# Patient Record
Sex: Female | Born: 1985 | Race: White | Hispanic: No | Marital: Married | Smoking: Never smoker
Health system: Southern US, Community
[De-identification: ages and names within clinical notes are randomized; demographics above are authoritative.]

## PROBLEM LIST (undated history)

## (undated) DIAGNOSIS — R0600 Dyspnea, unspecified: Secondary | ICD-10-CM

## (undated) DIAGNOSIS — O24419 Gestational diabetes mellitus in pregnancy, unspecified control: Secondary | ICD-10-CM

## (undated) DIAGNOSIS — F419 Anxiety disorder, unspecified: Secondary | ICD-10-CM

## (undated) DIAGNOSIS — K8020 Calculus of gallbladder without cholecystitis without obstruction: Secondary | ICD-10-CM

## (undated) DIAGNOSIS — E039 Hypothyroidism, unspecified: Secondary | ICD-10-CM

## (undated) DIAGNOSIS — E061 Subacute thyroiditis: Secondary | ICD-10-CM

## (undated) DIAGNOSIS — D219 Benign neoplasm of connective and other soft tissue, unspecified: Secondary | ICD-10-CM

## (undated) DIAGNOSIS — N809 Endometriosis, unspecified: Secondary | ICD-10-CM

## (undated) DIAGNOSIS — R002 Palpitations: Secondary | ICD-10-CM

## (undated) DIAGNOSIS — F32A Depression, unspecified: Secondary | ICD-10-CM

## (undated) DIAGNOSIS — E069 Thyroiditis, unspecified: Secondary | ICD-10-CM

## (undated) DIAGNOSIS — D649 Anemia, unspecified: Secondary | ICD-10-CM

## (undated) DIAGNOSIS — N393 Stress incontinence (female) (male): Secondary | ICD-10-CM

## (undated) DIAGNOSIS — N83209 Unspecified ovarian cyst, unspecified side: Secondary | ICD-10-CM

## (undated) DIAGNOSIS — K219 Gastro-esophageal reflux disease without esophagitis: Secondary | ICD-10-CM

## (undated) DIAGNOSIS — J45909 Unspecified asthma, uncomplicated: Secondary | ICD-10-CM

## (undated) HISTORY — DX: Dyspnea, unspecified: R06.00

## (undated) HISTORY — DX: Gestational diabetes mellitus in pregnancy, unspecified control: O24.419

## (undated) HISTORY — DX: Palpitations: R00.2

## (undated) HISTORY — DX: Stress incontinence (female) (male): N39.3

## (undated) HISTORY — DX: Anemia, unspecified: D64.9

## (undated) HISTORY — PX: OTHER SURGICAL HISTORY: SHX169

## (undated) HISTORY — DX: Subacute thyroiditis: E06.1

## (undated) HISTORY — DX: Benign neoplasm of connective and other soft tissue, unspecified: D21.9

## (undated) HISTORY — DX: Endometriosis, unspecified: N80.9

## (undated) HISTORY — DX: Calculus of gallbladder without cholecystitis without obstruction: K80.20

## (undated) HISTORY — DX: Thyroiditis, unspecified: E06.9

## (undated) HISTORY — DX: Unspecified ovarian cyst, unspecified side: N83.209

## (undated) HISTORY — DX: Depression, unspecified: F32.A

---

## 2007-06-25 HISTORY — PX: LAPAROSCOPY: SHX197

## 2014-07-09 ENCOUNTER — Observation Stay: Payer: Self-pay | Admitting: Obstetrics and Gynecology

## 2014-07-09 LAB — URINALYSIS, COMPLETE
BILIRUBIN, UR: NEGATIVE
GLUCOSE, UR: NEGATIVE mg/dL (ref 0–75)
LEUKOCYTE ESTERASE: NEGATIVE
NITRITE: NEGATIVE
Ph: 7 (ref 4.5–8.0)
Protein: NEGATIVE
RBC,UR: 709 /HPF (ref 0–5)
SPECIFIC GRAVITY: 1.012 (ref 1.003–1.030)
Squamous Epithelial: 6

## 2014-07-11 LAB — URINE CULTURE

## 2014-08-11 ENCOUNTER — Inpatient Hospital Stay: Payer: Self-pay

## 2014-11-01 NOTE — H&P (Signed)
L&D Evaluation:  History:  HPI 29yo MWF presents in active labor at 40w, G1P0000, normal prenatal care to date.   Presents with contractions   Patient's Medical History endometriosis   Patient's Surgical History exploratory lap for pelvic pain 2009   Medications Pre Natal Vitamins  Tylenol (Acetaminophen)   Allergies NKDA   Social History none   Family History Non-Contributory   ROS:  ROS All systems were reviewed.  HEENT, CNS, GI, GU, Respiratory, CV, Renal and Musculoskeletal systems were found to be normal.   Exam:  Vital Signs stable   General no apparent distress   Mental Status clear   Chest clear   Heart normal sinus rhythm   Abdomen gravid, tender with contractions   Estimated Fetal Weight Average for gestational age   Fetal Position vtx   Edema 1+   Mebranes Ruptured, 1730   Description clear   FHT normal rate with no decels   FHT Description Variable decelerations   Ucx regular   Ucx Frequency 2 min   Length of each Contraction 60 seconds   Ucx Pain Scale 9   Impression:  Impression active labor   Plan:  Plan monitor contractions and for cervical change, epidural   Electronic Signatures: Ines BloomerBurr, Melody N (CNM)  (Signed 18-Feb-16 22:02)  Authored: L&D Evaluation   Last Updated: 18-Feb-16 22:02 by Ulyses AmorBurr, Melody N (CNM)

## 2015-01-09 ENCOUNTER — Encounter: Payer: Self-pay | Admitting: *Deleted

## 2015-01-10 ENCOUNTER — Ambulatory Visit (INDEPENDENT_AMBULATORY_CARE_PROVIDER_SITE_OTHER): Payer: BLUE CROSS/BLUE SHIELD | Admitting: Obstetrics and Gynecology

## 2015-01-10 ENCOUNTER — Encounter: Payer: Self-pay | Admitting: Obstetrics and Gynecology

## 2015-01-10 ENCOUNTER — Other Ambulatory Visit: Payer: Self-pay | Admitting: Obstetrics and Gynecology

## 2015-01-10 VITALS — BP 131/80 | HR 110 | Ht 65.0 in | Wt 196.8 lb

## 2015-01-10 DIAGNOSIS — R000 Tachycardia, unspecified: Secondary | ICD-10-CM | POA: Diagnosis not present

## 2015-01-10 DIAGNOSIS — Z01419 Encounter for gynecological examination (general) (routine) without abnormal findings: Secondary | ICD-10-CM

## 2015-01-10 NOTE — Patient Instructions (Signed)
Thank you for enrolling in MyChart. Please follow the instructions below to securely access your online medical record. MyChart allows you to send messages to your doctor, view your test results, renew your prescriptions, schedule appointments, and more.  How Do I Sign Up? 1. In your Internet browser, go to [email or web address] 2. Click on the New  User? link in the Sign In box.  3. Enter your MyChart Access Code exactly as it appears below. You will not need to use this code after you have completed the sign-up process. If you do not sign up before the expiration date, you must request a new code. MyChart Access Code: [email or web address] Expires: 03/11/2015 10:51 AM  4. Enter the last four digits of your Social Security Number (xxxx) and Date of Birth (mm/dd/yyyy) as indicated and click Next. You will be taken to the next sign-up page. 5. Create a MyChart ID. This will be your MyChart login ID and cannot be changed, so think of one that is secure and easy to remember. 6. Create a MyChart password. You can change your password at any time. 7. Enter your Password Reset Question and Answer and click Next. This can be used at a later time if you forget your password.  8. Select your communication preference, and if applicable enter your e-mail address. You will receive e-mail notification when new information is available in MyChart by choosing to receive e-mail notifications and filling in your e-mail. 9. Click Sign In. You can now view your medical record.   Additional Information If you have questions, you can email [email or web address] or call [phone] to talk to our MyChart staff. Remember, MyChart is NOT to be used for urgent needs. For medical emergencies, dial 911. Nonspecific Tachycardia Tachycardia is a faster than normal heartbeat (more than 100 beats per minute). In adults, the heart normally beats between 60 and 100 times a minute. A fast heartbeat may be a  normal response to exercise or stress. It does not necessarily mean that something is wrong. However, sometimes when your heart beats too fast it may not be able to pump enough blood to the rest of your body. This can result in chest pain, shortness of breath, dizziness, and even fainting. Nonspecific tachycardia means that the specific cause or pattern of your tachycardia is unknown. CAUSES  Tachycardia may be harmless or it may be due to a more serious underlying cause. Possible causes of tachycardia include:  Exercise or exertion.  Fever.  Pain or injury.  Infection.  Loss of body fluids (dehydration).  Overactive thyroid.  Lack of red blood cells (anemia).  Anxiety and stress.  Alcohol.  Caffeine.  Tobacco products.  Diet pills.  Illegal drugs.  Heart disease. SYMPTOMS  Rapid or irregular heartbeat (palpitations).  Suddenly feeling your heart beating (cardiac awareness).  Dizziness.  Tiredness (fatigue).  Shortness of breath.  Chest pain.  Nausea.  Fainting. DIAGNOSIS  Your caregiver will perform a physical exam and take your medical history. In some cases, a heart specialist (cardiologist) may be consulted. Your caregiver may also order:  Blood tests.  Electrocardiography. This test records the electrical activity of your heart.  A heart monitoring test. TREATMENT  Treatment will depend on the likely cause of your tachycardia. The goal is to treat the underlying cause of your tachycardia. Treatment methods may include:  Replacement of fluids or blood through an intravenous (IV) tube for moderate to severe dehydration or anemia.  New medicines or changes  in your current medicines.  Diet and lifestyle changes.  Treatment for certain infections.  Stress relief or relaxation methods. HOME CARE INSTRUCTIONS   Rest.  Drink enough fluids to keep your urine clear or pale yellow.  Do not  smoke.  Avoid:  Caffeine.  Tobacco.  Alcohol.  Chocolate.  Stimulants such as over-the-counter diet pills or pills that help you stay awake.  Situations that cause anxiety or stress.  Illegal drugs such as marijuana, phencyclidine (PCP), and cocaine.  Only take medicine as directed by your caregiver.  Keep all follow-up appointments as directed by your caregiver. SEEK IMMEDIATE MEDICAL CARE IF:   You have pain in your chest, upper arms, jaw, or neck.  You become weak, dizzy, or feel faint.  You have palpitations that will not go away.  You vomit, have diarrhea, or pass blood in your stool.  Your skin is cool, pale, and wet.  You have a fever that will not go away with rest, fluids, and medicine. MAKE SURE YOU:   Understand these instructions.  Will watch your condition.  Will get help right away if you are not doing well or get worse. Document Released: 07/18/2004 Document Revised: 09/02/2011 Document Reviewed: 05/21/2011 [facility] Patient Information 2015 [place]. This information is not intended to replace advice given to you by your health care provider. Make sure you discuss any questions you have with your health care provider.

## 2015-01-10 NOTE — Progress Notes (Signed)
  Subjective:     Diane [name] is a 29 y.o. female and is here for a comprehensive physical exam. The patient reports waking up with headaches the last few weeks.  History   Social History  . Marital Status: Married    Spouse Name: N/A  . Number of Children: N/A  . Years of Education: N/A   Occupational History  . Not on file.   Social History Main Topics  . Smoking status: Never Smoker   . Smokeless tobacco: Never Used  . Alcohol Use: No  . Drug Use: No  . Sexual Activity: Yes    Birth Control/ Protection: Pill   Other Topics Concern  . Not on file   Social History Narrative   Health Maintenance  Topic Date Due  . HIV Screening  04/22/2000  . PAP SMEAR  04/23/2003  . TETANUS/TDAP  04/22/2004  . INFLUENZA VACCINE  01/23/2015    The following portions of the patient's history were reviewed and updated as appropriate: allergies, current medications, past family history, past medical history, past social history, past surgical history and problem list.  Review of Systems A comprehensive review of systems was negative.   Objective:    General appearance: alert, cooperative and appears stated age Head: Normocephalic, without obvious abnormality, atraumatic Neck: no adenopathy, no carotid bruit, no JVD, supple, symmetrical, trachea midline and thyroid not enlarged, symmetric, no tenderness/mass/nodules Lungs: clear to auscultation bilaterally Breasts: normal appearance, no masses or tenderness Heart: regular rate and rhythm, S1, S2 normal, no murmur, click, rub or gallop and with persistant rate >100 Abdomen: soft, non-tender; bowel sounds normal; no masses,  no organomegaly Pelvic: cervix normal in appearance, external genitalia normal, no adnexal masses or tenderness, no cervical motion tenderness, rectovaginal septum normal, uterus normal size, shape, and consistency and vagina normal without discharge    Assessment:    Healthy female exam. Breastfeeding  without complications; tachycardia-asymptomatic      Plan:  Routine pap obtained, continue health aintenance and breastfeeding friendly lifestyle; labs obtained to screen for reason for tachycardia- patient aware and counseled on s/s of persistant tachycardia or SVT. RTC 1 year of prn    See After Visit Summary for Counseling Recommendations  Melody Ines BloomerBurr, CNM

## 2015-01-11 LAB — COMPREHENSIVE METABOLIC PANEL
ALT: 12 IU/L (ref 0–32)
AST: 9 IU/L (ref 0–40)
Albumin/Globulin Ratio: 1.6 (ref 1.1–2.5)
Albumin: 4.4 g/dL (ref 3.5–5.5)
Alkaline Phosphatase: 82 IU/L (ref 39–117)
BILIRUBIN TOTAL: 0.3 mg/dL (ref 0.0–1.2)
BUN / CREAT RATIO: 15 (ref 8–20)
BUN: 7 mg/dL (ref 6–20)
CALCIUM: 9.5 mg/dL (ref 8.7–10.2)
CHLORIDE: 102 mmol/L (ref 97–108)
CO2: 21 mmol/L (ref 18–29)
Creatinine, Ser: 0.48 mg/dL — ABNORMAL LOW (ref 0.57–1.00)
GFR calc non Af Amer: 133 mL/min/{1.73_m2} (ref >59)
GFR, EST AFRICAN AMERICAN: 153 mL/min/{1.73_m2} (ref >59)
Globulin, Total: 2.8 g/dL (ref 1.5–4.5)
Glucose: 90 mg/dL (ref 65–99)
Potassium: 4.1 mmol/L (ref 3.5–5.2)
SODIUM: 141 mmol/L (ref 134–144)
TOTAL PROTEIN: 7.2 g/dL (ref 6.0–8.5)

## 2015-01-11 LAB — THYROID PANEL WITH TSH
Free Thyroxine Index: 4.5 (ref 1.2–4.9)
T3 Uptake Ratio: 34 % (ref 24–39)
T4, Total: 13.2 ug/dL — ABNORMAL HIGH (ref 4.5–12.0)
TSH: 0.005 u[IU]/mL — ABNORMAL LOW (ref 0.450–4.500)

## 2015-01-11 LAB — VITAMIN D 25 HYDROXY (VIT D DEFICIENCY, FRACTURES): Vit D, 25-Hydroxy: 24.7 ng/mL — ABNORMAL LOW (ref 30.0–100.0)

## 2015-01-11 LAB — CBC
Hematocrit: 36.5 % (ref 34.0–46.6)
Hemoglobin: 12.7 g/dL (ref 11.1–15.9)
MCH: 27.7 pg (ref 26.6–33.0)
MCHC: 34.8 g/dL (ref 31.5–35.7)
MCV: 80 fL (ref 79–97)
Platelets: 337 10*3/uL (ref 150–379)
RBC: 4.58 x10E6/uL (ref 3.77–5.28)
RDW: 12.7 % (ref 12.3–15.4)
WBC: 6.3 10*3/uL (ref 3.4–10.8)

## 2015-01-17 ENCOUNTER — Other Ambulatory Visit: Payer: Self-pay | Admitting: Obstetrics and Gynecology

## 2015-01-17 DIAGNOSIS — R7989 Other specified abnormal findings of blood chemistry: Secondary | ICD-10-CM

## 2015-01-17 DIAGNOSIS — E559 Vitamin D deficiency, unspecified: Secondary | ICD-10-CM

## 2015-01-17 MED ORDER — VITAMIN D (ERGOCALCIFEROL) 1.25 MG (50000 UNIT) PO CAPS: 50000.0000 [IU] | ORAL_CAPSULE | ORAL | Status: DC

## 2015-01-18 ENCOUNTER — Telehealth: Payer: Self-pay | Admitting: *Deleted

## 2015-01-18 LAB — PAP LB, CT-NG NAA, HPV HIGH-RISK
HPV, HIGH-RISK: NEGATIVE
PAP SMEAR COMMENT: 0

## 2015-01-18 NOTE — Telephone Encounter (Signed)
Notified pt of lab results, she would like a referral to [facility] for her thyroid, will pick up rx @ pharmacy

## 2015-01-18 NOTE — Telephone Encounter (Signed)
-----   Message from Ulyses Amor, [place] sent at 01/17/2015  2:45 PM EDT ----- Please let her know her vit d is low,   sent in rx for twice weekly supplement, and want to recheck her levels in 3 months.  Also thyriod function came back abnormal, TSH low and T3 elevated- want her to follow up with an endocrinologist, referral is in system- see if she has a preference. Tell her not to worry yet, as we sometimes see this in the first year after pregnancy.

## 2015-02-13 ENCOUNTER — Ambulatory Visit: Payer: Self-pay | Admitting: Endocrinology

## 2015-02-14 ENCOUNTER — Ambulatory Visit (INDEPENDENT_AMBULATORY_CARE_PROVIDER_SITE_OTHER): Payer: Self-pay | Admitting: Endocrinology

## 2015-02-14 ENCOUNTER — Encounter: Payer: Self-pay | Admitting: Endocrinology

## 2015-02-14 VITALS — BP 126/87 | HR 68 | Temp 98.0°F | Ht 65.0 in

## 2015-02-14 DIAGNOSIS — E0590 Thyrotoxicosis, unspecified without thyrotoxic crisis or storm: Secondary | ICD-10-CM | POA: Diagnosis not present

## 2015-02-14 LAB — TSH: TSH: 70.82 u[IU]/mL — ABNORMAL HIGH (ref 0.35–4.50)

## 2015-02-14 LAB — T4, FREE: FREE T4: 0.33 ng/dL — AB (ref 0.60–1.60)

## 2015-02-14 MED ORDER — LEVOTHYROXINE SODIUM 100 MCG PO TABS: 100.0000 ug | ORAL_TABLET | Freq: Every day | ORAL | Status: DC

## 2015-02-14 NOTE — Patient Instructions (Signed)
blood tests are requested for you today.  We'll let you know about the results. If it is not severely high, you can hold off on any medication for now, as you are breast-feeding. Please come back for a follow-up appointment in 2 months.    Hyperthyroidism The thyroid is a large gland located in the lower front part of your neck. The thyroid helps control metabolism. Metabolism is how your body uses food. It controls metabolism with the hormone thyroxine. When the thyroid is overactive, it produces too much hormone. When this happens, these following problems may occur:   Nervousness  Heat intolerance  Weight loss (in spite of increase food intake)  Diarrhea  Change in hair or skin texture  Palpitations (heart skipping or having extra beats)  Tachycardia (rapid heart rate)  Loss of menstruation (amenorrhea)  Shaking of the hands CAUSES  Grave's Disease (the immune system attacks the thyroid gland). This is the most common cause.  Inflammation of the thyroid gland.  Tumor (usually benign) in the thyroid gland or elsewhere.  Excessive use of thyroid medications (both prescription and 'natural').  Excessive ingestion of Iodine. DIAGNOSIS  To prove hyperthyroidism, your caregiver may do blood tests and ultrasound tests. Sometimes the signs are hidden. It may be necessary for your caregiver to watch this illness with blood tests, either before or after diagnosis and treatment. TREATMENT Short-term treatment There are several treatments to control symptoms. Drugs called beta blockers may give some relief. Drugs that decrease hormone production will provide temporary relief in many people. These measures will usually not give permanent relief. Definitive therapy There are treatments available which can be discussed between you and your caregiver which will permanently treat the problem. These treatments range from surgery (removal of the thyroid), to the use of radioactive iodine  (destroys the thyroid by radiation), to the use of antithyroid drugs (interfere with hormone synthesis). The first two treatments are permanent and usually successful. They most often require hormone replacement therapy for life. This is because it is impossible to remove or destroy the exact amount of thyroid required to make a person euthyroid (normal). HOME CARE INSTRUCTIONS  See your caregiver if the problems you are being treated for get worse. Examples of this would be the problems listed above. SEEK MEDICAL CARE IF: Your general condition worsens. MAKE SURE YOU:   Understand these instructions.  Will watch your condition.  Will get help right away if you are not doing well or get worse. Document Released: 06/10/2005 Document Revised: 09/02/2011 Document Reviewed: 10/22/2006 [facility] Patient Information 2015 [place], [place]. This information is not intended to replace advice given to you by your health care provider. Make sure you discuss any questions you have with your health care provider.

## 2015-02-14 NOTE — Progress Notes (Signed)
Subjective:    Patient ID: Diane [name], female    DOB: [birth year], 29 y.o.   MRN: [id number]  HPI Pt reports he was dx'ed with hyperthyroidism in mid-2016.  she has no previous h/o thyroid problems.  she has never had XRT to the anterior neck, or thyroid surgery.  she has never had thyroid imaging.  she does not consume kelp or any other prescribed or non-prescribed thyroid medication.  she has never been on amiodarone.  She is 6 mos postpartum.  She has slight hair loss throughout the head, and assoc fatigue.  She is breast feeding.   Past Medical History  Diagnosis Date  . Endometriosis   . Ovarian cyst     Past Surgical History  Procedure Laterality Date  . Laparoscopy  2009    Social History   Social History  . Marital Status: Married    Spouse Name: N/A  . Number of Children: N/A  . Years of Education: N/A   Occupational History  . Not on file.   Social History Main Topics  . Smoking status: Never Smoker   . Smokeless tobacco: Never Used  . Alcohol Use: No  . Drug Use: No  . Sexual Activity: Yes    Birth Control/ Protection: Pill   Other Topics Concern  . Not on file   Social History Narrative    Current Outpatient Prescriptions on File Prior to Visit  Medication Sig Dispense Refill  . albuterol (PROVENTIL HFA;VENTOLIN HFA) 108 (90 BASE) MCG/ACT inhaler Inhale into the lungs every 6 (six) hours as needed for wheezing or shortness of breath.    . norethindrone (MICRONOR,CAMILA,ERRIN) 0.35 MG tablet Take 1 tablet by mouth daily.    . Prenatal Vit-Fe Fumarate-FA (PRENATAL MULTIVITAMIN) TABS tablet Take 1 tablet by mouth daily at 12 noon.    . Vitamin D, Ergocalciferol, (DRISDOL) 50000 UNITS CAPS capsule Take 1 capsule (50,000 Units total) by mouth 2 (two) times a week. 30 capsule 2   No current facility-administered medications on file prior to visit.    No Known Allergies  Family History  Problem Relation Age of Onset  . Diabetes Father   . Cancer  Maternal Grandmother     colon, breast  . Heart disease Maternal Grandmother   . Heart disease Paternal Grandfather   . Thyroid disease Neg Hx     BP 126/87 mmHg  Pulse 68  Temp(Src) 98 F (36.7 C) (Oral)  Ht  (1.651 m)  SpO2 94%  Review of Systems denies headache, hoarseness, double vision, sob, diarrhea, polyuria, myalgias, excessive diaphoresis, tremor, anxiety, hypoglycemia, easy bruising, and rhinorrhea.  She has lost back to her pre-pregnancy weight.  She has intermittent palpitations.     Objective:   Physical Exam VS: see vs page GEN: no distress HEAD: head: no deformity eyes: no periorbital swelling, no proptosis external nose and ears are normal mouth: no lesion seen NECK: thyroid is approx 3-4 times normal size, with a slightly irregular surface CHEST WALL: no deformity LUNGS:  Clear to auscultation CV: reg rate and rhythm, no murmur ABD: abdomen is soft, nontender.  no hepatosplenomegaly.  not distended.  no hernia MUSCULOSKELETAL: muscle bulk and strength are grossly normal.  no obvious joint swelling.  gait is normal and steady EXTEMITIES: no deformity.  no ulcer on the feet.  feet are of normal color and temp.  no edema PULSES: dorsalis pedis intact bilat.  no carotid bruit NEURO:  cn 2-12 grossly intact.  readily moves all 4's.  sensation is intact to touch on the feet.  No tremor. SKIN:  Normal texture and temperature.  No rash or suspicious lesion is visible.   NODES:  None palpable at the neck PSYCH: alert, well-oriented.  Does not appear anxious nor depressed.  outside test results are reviewed: (july, 2016): TSH=0.005  Today: Lab Results  Component Value Date   TSH 70.82* 02/14/2015   T4TOTAL 13.2* 01/10/2015      Assessment & Plan:  Hypothyroidism, new.  As this has evolved from hyperthyroidism, dx is subacute (viral) thyroiditis.  I have sent a prescription to your pharmacy, for synthroid.    Patient is advised the following: Patient  Instructions  blood tests are requested for you today.  We'll let you know about the results. If it is not severely high, you can hold off on any medication for now, as you are breast-feeding. Please come back for a follow-up appointment in 2 months.    Hyperthyroidism The thyroid is a large gland located in the lower front part of your neck. The thyroid helps control metabolism. Metabolism is how your body uses food. It controls metabolism with the hormone thyroxine. When the thyroid is overactive, it produces too much hormone. When this happens, these following problems may occur:   Nervousness  Heat intolerance  Weight loss (in spite of increase food intake)  Diarrhea  Change in hair or skin texture  Palpitations (heart skipping or having extra beats)  Tachycardia (rapid heart rate)  Loss of menstruation (amenorrhea)  Shaking of the hands CAUSES  Grave's Disease (the immune system attacks the thyroid gland). This is the most common cause.  Inflammation of the thyroid gland.  Tumor (usually benign) in the thyroid gland or elsewhere.  Excessive use of thyroid medications (both prescription and 'natural').  Excessive ingestion of Iodine. DIAGNOSIS  To prove hyperthyroidism, your caregiver may do blood tests and ultrasound tests. Sometimes the signs are hidden. It may be necessary for your caregiver to watch this illness with blood tests, either before or after diagnosis and treatment. TREATMENT Short-term treatment There are several treatments to control symptoms. Drugs called beta blockers may give some relief. Drugs that decrease hormone production will provide temporary relief in many people. These measures will usually not give permanent relief. Definitive therapy There are treatments available which can be discussed between you and your caregiver which will permanently treat the problem. These treatments range from surgery (removal of the thyroid), to the use of  radioactive iodine (destroys the thyroid by radiation), to the use of antithyroid drugs (interfere with hormone synthesis). The first two treatments are permanent and usually successful. They most often require hormone replacement therapy for life. This is because it is impossible to remove or destroy the exact amount of thyroid required to make a person euthyroid (normal). HOME CARE INSTRUCTIONS  See your caregiver if the problems you are being treated for get worse. Examples of this would be the problems listed above. SEEK MEDICAL CARE IF: Your general condition worsens. MAKE SURE YOU:   Understand these instructions.  Will watch your condition.  Will get help right away if you are not doing well or get worse. Document Released: 06/10/2005 Document Revised: 09/02/2011 Document Reviewed: 10/22/2006 [facility] Patient Information 2015 [place], [place]. This information is not intended to replace advice given to you by your health care provider. Make sure you discuss any questions you have with your health care provider.

## 2015-02-28 ENCOUNTER — Telehealth: Payer: Self-pay | Admitting: Obstetrics and Gynecology

## 2015-02-28 NOTE — Telephone Encounter (Signed)
Can be normal, only other options since she is breastfeeding is to switch to Depo or LoLo Estrin , but combined pill may decrease milk supply.

## 2015-02-28 NOTE — Telephone Encounter (Signed)
Notified pt of MNB response she voiced understanding

## 2015-02-28 NOTE — Telephone Encounter (Signed)
Pt is having a period q 2 weeks, she is breast feeding and taking oral contraceptives, she takes it at same time each night, pls advise

## 2015-02-28 NOTE — Telephone Encounter (Signed)
PT CALLED AND SHE HAD SOME QUESTIONS ABOUT THE BC SHE IS ON

## 2015-03-20 ENCOUNTER — Encounter: Payer: Self-pay | Admitting: Endocrinology

## 2015-03-20 ENCOUNTER — Ambulatory Visit (INDEPENDENT_AMBULATORY_CARE_PROVIDER_SITE_OTHER): Payer: BLUE CROSS/BLUE SHIELD | Admitting: Endocrinology

## 2015-03-20 VITALS — BP 122/80 | HR 80 | Temp 98.5°F | Ht 65.0 in | Wt 198.0 lb

## 2015-03-20 DIAGNOSIS — E0590 Thyrotoxicosis, unspecified without thyrotoxic crisis or storm: Secondary | ICD-10-CM

## 2015-03-20 DIAGNOSIS — E061 Subacute thyroiditis: Secondary | ICD-10-CM

## 2015-03-20 DIAGNOSIS — E039 Hypothyroidism, unspecified: Secondary | ICD-10-CM | POA: Insufficient documentation

## 2015-03-20 HISTORY — DX: Subacute thyroiditis: E06.1

## 2015-03-20 LAB — T4, FREE: Free T4: 0.85 ng/dL (ref 0.60–1.60)

## 2015-03-20 LAB — TSH: TSH: 4.74 u[IU]/mL — ABNORMAL HIGH (ref 0.35–4.50)

## 2015-03-20 MED ORDER — LEVOTHYROXINE SODIUM 112 MCG PO TABS
112.0000 ug | ORAL_TABLET | Freq: Every day | ORAL | Status: DC
Start: 2015-03-20 — End: 2015-08-02

## 2015-03-20 NOTE — Patient Instructions (Addendum)
blood tests are requested for you today.  We'll let you know about the results. Please come back for a follow-up appointment in 2 months.  This condition is called "subacute thyroiditis."

## 2015-03-20 NOTE — Progress Notes (Signed)
   Subjective:    Patient ID: Diane [name], female    DOB: Oct 24, 1985, 29 y.o.   MRN: [id number]  HPI Pt returns for f/u of subacute thyroiditis (dx'ed mid-2016; she has never had thyroid imaging.  she had a child in ealry 2016).  she is breast feeding.  Since on synthroid, pt states she feels no different, and well in general.   Past Medical History  Diagnosis Date  . Endometriosis   . Ovarian cyst     Past Surgical History  Procedure Laterality Date  . Laparoscopy  2009    Social History   Social History  . Marital Status: Married    Spouse Name: N/A  . Number of Children: N/A  . Years of Education: N/A   Occupational History  . Not on file.   Social History Main Topics  . Smoking status: Never Smoker   . Smokeless tobacco: Never Used  . Alcohol Use: No  . Drug Use: No  . Sexual Activity: Yes    Birth Control/ Protection: Pill   Other Topics Concern  . Not on file   Social History Narrative    Current Outpatient Prescriptions on File Prior to Visit  Medication Sig Dispense Refill  . albuterol (PROVENTIL HFA;VENTOLIN HFA) 108 (90 BASE) MCG/ACT inhaler Inhale into the lungs every 6 (six) hours as needed for wheezing or shortness of breath.    . norethindrone (MICRONOR,CAMILA,ERRIN) 0.35 MG tablet Take 1 tablet by mouth daily.    . Prenatal Vit-Fe Fumarate-FA (PRENATAL MULTIVITAMIN) TABS tablet Take 1 tablet by mouth daily at 12 noon.    . Vitamin D, Ergocalciferol, (DRISDOL) 50000 UNITS CAPS capsule Take 1 capsule (50,000 Units total) by mouth 2 (two) times a week. 30 capsule 2   No current facility-administered medications on file prior to visit.    No Known Allergies  Family History  Problem Relation Age of Onset  . Diabetes Father   . Cancer Maternal Grandmother     colon, breast  . Heart disease Maternal Grandmother   . Heart disease Paternal Grandfather   . Thyroid disease Neg Hx     BP 122/80 mmHg  Pulse 80  Temp(Src) 98.5 F (36.9 C)  (Oral)  Ht  (1.651 m)  Wt 198 lb (89.812 kg)  BMI 32.95 kg/m2  SpO2 96%  Review of Systems Hair loss is improved.      Objective:   Physical Exam VITAL SIGNS:  See vs page GENERAL: no distress Neck; thyroid is twice normal size, with slightly irreg surface.     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  She needs increased rx.    Patient is advised the following: Patient Instructions  blood tests are requested for you today.  We'll let you know about the results. Please come back for a follow-up appointment in 2 months.  This condition is called "subacute thyroiditis."

## 2015-03-22 ENCOUNTER — Ambulatory Visit: Payer: BLUE CROSS/BLUE SHIELD | Admitting: Endocrinology

## 2015-04-17 ENCOUNTER — Ambulatory Visit: Payer: BLUE CROSS/BLUE SHIELD | Admitting: Endocrinology

## 2015-05-15 ENCOUNTER — Ambulatory Visit: Payer: BLUE CROSS/BLUE SHIELD | Admitting: Endocrinology

## 2015-05-24 ENCOUNTER — Ambulatory Visit (INDEPENDENT_AMBULATORY_CARE_PROVIDER_SITE_OTHER): Payer: BLUE CROSS/BLUE SHIELD | Admitting: Endocrinology

## 2015-05-24 ENCOUNTER — Encounter: Payer: Self-pay | Admitting: Endocrinology

## 2015-05-24 VITALS — BP 124/82 | HR 87 | Temp 98.2°F | Ht 65.0 in | Wt 200.0 lb

## 2015-05-24 DIAGNOSIS — E061 Subacute thyroiditis: Secondary | ICD-10-CM

## 2015-05-24 LAB — T4, FREE: FREE T4: 0.93 ng/dL (ref 0.60–1.60)

## 2015-05-24 LAB — TSH: TSH: 2.31 u[IU]/mL (ref 0.35–4.50)

## 2015-05-24 NOTE — Patient Instructions (Addendum)
blood tests are requested for you today.  We'll let you know about the results. Please come back for a follow-up appointment in 4 months.     

## 2015-05-24 NOTE — Progress Notes (Signed)
   Subjective:    Patient ID: Diane [name], female    DOB: [birth year], 29 y.o.   MRN: [id number]  HPI Pt returns for f/u of subacute thyroiditis (dx'ed mid-2016; she has never had thyroid imaging.  she had a child in ealry 2016; she was rx'ed synthroid in late 2016).  she is breast feeding. pt states she feels well in general.   Past Medical History  Diagnosis Date  . Endometriosis   . Ovarian cyst     Past Surgical History  Procedure Laterality Date  . Laparoscopy  2009    Social History   Social History  . Marital Status: Married    Spouse Name: N/A  . Number of Children: N/A  . Years of Education: N/A   Occupational History  . Not on file.   Social History Main Topics  . Smoking status: Never Smoker   . Smokeless tobacco: Never Used  . Alcohol Use: No  . Drug Use: No  . Sexual Activity: Yes    Birth Control/ Protection: Pill   Other Topics Concern  . Not on file   Social History Narrative    Current Outpatient Prescriptions on File Prior to Visit  Medication Sig Dispense Refill  . albuterol (PROVENTIL HFA;VENTOLIN HFA) 108 (90 BASE) MCG/ACT inhaler Inhale into the lungs every 6 (six) hours as needed for wheezing or shortness of breath.    . levothyroxine (SYNTHROID, LEVOTHROID) 112 MCG tablet Take 1 tablet (112 mcg total) by mouth daily before breakfast. 30 tablet 2  . norethindrone (MICRONOR,CAMILA,ERRIN) 0.35 MG tablet Take 1 tablet by mouth daily.    . Prenatal Vit-Fe Fumarate-FA (PRENATAL MULTIVITAMIN) TABS tablet Take 1 tablet by mouth daily at 12 noon.    . Vitamin D, Ergocalciferol, (DRISDOL) 50000 UNITS CAPS capsule Take 1 capsule (50,000 Units total) by mouth 2 (two) times a week. 30 capsule 2   No current facility-administered medications on file prior to visit.    No Known Allergies  Family History  Problem Relation Age of Onset  . Diabetes Father   . Cancer Maternal Grandmother     colon, breast  . Heart disease Maternal Grandmother   .  Heart disease Paternal Grandfather   . Thyroid disease Neg Hx     BP 124/82 mmHg  Pulse 87  Temp(Src) 98.2 F (36.8 C) (Oral)  Ht 5\' 5"  (1.651 m)  Wt 200 lb (90.719 kg)  BMI 33.28 kg/m2  SpO2 97%  Review of Systems No weight change    Objective:   Physical Exam VITAL SIGNS:  See vs page GENERAL: no distress Neck: thyroid is slightly enlarged, with slightly irreg surface.   Lab Results  Component Value Date   TSH 2.31 05/24/2015   T4TOTAL 13.2* 01/10/2015      Assessment & Plan:  Hypothyroidism: well-replaced.    Patient is advised the following: Patient Instructions  blood tests are requested for you today.  We'll let you know about the results.  Please come back for a follow-up appointment in 4 months.      addendum: Please continue the same synthroid.

## 2015-08-02 ENCOUNTER — Other Ambulatory Visit: Payer: Self-pay | Admitting: Endocrinology

## 2015-08-29 ENCOUNTER — Other Ambulatory Visit: Payer: Self-pay | Admitting: Endocrinology

## 2015-09-19 ENCOUNTER — Encounter: Payer: Self-pay | Admitting: Endocrinology

## 2015-09-19 ENCOUNTER — Ambulatory Visit (INDEPENDENT_AMBULATORY_CARE_PROVIDER_SITE_OTHER): Payer: BLUE CROSS/BLUE SHIELD | Admitting: Endocrinology

## 2015-09-19 VITALS — BP 122/80 | HR 87 | Temp 98.1°F | Ht 69.0 in | Wt 200.0 lb

## 2015-09-19 DIAGNOSIS — E061 Subacute thyroiditis: Secondary | ICD-10-CM | POA: Diagnosis not present

## 2015-09-19 LAB — TSH: TSH: 0.46 u[IU]/mL (ref 0.35–4.50)

## 2015-09-19 NOTE — Patient Instructions (Addendum)
blood tests are requested for you today.  We'll let you know about the results.  Please come back for a follow-up appointment in 6 months, or sooner if another pregnancy happens.

## 2015-09-19 NOTE — Progress Notes (Signed)
   Subjective:    Patient ID: Diane [name], female    DOB: 11-Apr-1986, 30 y.o.   MRN: [id number]  HPI Pt returns for f/u of subacute thyroiditis (dx'ed mid-2016; she has never had thyroid imaging; she had a child in ealry 2016; she was rx'ed synthroid in late 2016).  she is still breast feeding. pt states she feels well in general.  She is not considering another pregnancy now.   Past Medical History  Diagnosis Date  . Endometriosis   . Ovarian cyst     Past Surgical History  Procedure Laterality Date  . Laparoscopy  2009    Social History   Social History  . Marital Status: Married    Spouse Name: N/A  . Number of Children: N/A  . Years of Education: N/A   Occupational History  . Not on file.   Social History Main Topics  . Smoking status: Never Smoker   . Smokeless tobacco: Never Used  . Alcohol Use: No  . Drug Use: No  . Sexual Activity: Yes    Birth Control/ Protection: Pill   Other Topics Concern  . Not on file   Social History Narrative    Current Outpatient Prescriptions on File Prior to Visit  Medication Sig Dispense Refill  . albuterol (PROVENTIL HFA;VENTOLIN HFA) 108 (90 BASE) MCG/ACT inhaler Inhale into the lungs every 6 (six) hours as needed for wheezing or shortness of breath.    . levothyroxine (SYNTHROID, LEVOTHROID) 112 MCG tablet TAKE 1 TABLET(112 MCG) BY MOUTH DAILY BEFORE BREAKFAST 30 tablet 0  . norethindrone (MICRONOR,CAMILA,ERRIN) 0.35 MG tablet Take 1 tablet by mouth daily.    . Prenatal Vit-Fe Fumarate-FA (PRENATAL MULTIVITAMIN) TABS tablet Take 1 tablet by mouth daily at 12 noon.    . fluconazole (DIFLUCAN) 100 MG tablet Reported on 09/19/2015  0  . Vitamin D, Ergocalciferol, (DRISDOL) 50000 UNITS CAPS capsule Take 1 capsule (50,000 Units total) by mouth 2 (two) times a week. (Patient not taking: Reported on 09/19/2015) 30 capsule 2   No current facility-administered medications on file prior to visit.    No Known Allergies  Family  History  Problem Relation Age of Onset  . Diabetes Father   . Cancer Maternal Grandmother     colon, breast  . Heart disease Maternal Grandmother   . Heart disease Paternal Grandfather   . Thyroid disease Neg Hx     BP 122/80 mmHg  Pulse 87  Temp(Src) 98.1 F (36.7 C) (Oral)  Ht 5\' 9"  (1.753 m)  Wt 200 lb (90.719 kg)  BMI 29.52 kg/m2  SpO2 96%  Review of Systems No weight change    Objective:   Physical Exam VITAL SIGNS:  See vs page GENERAL: no distress Neck: thyroid is slightly enlarged, with slightly irreg surface.    Lab Results  Component Value Date   TSH 0.46 09/19/2015   T4TOTAL 13.2* 01/10/2015      Assessment & Plan:  Hypothyroidism: well-replaced  Patient is advised the following: Patient Instructions  blood tests are requested for you today.  We'll let you know about the results.  Please come back for a follow-up appointment in 6 months, or sooner if another pregnancy happens.

## 2015-09-25 ENCOUNTER — Telehealth: Payer: Self-pay | Admitting: Obstetrics and Gynecology

## 2015-09-25 NOTE — Telephone Encounter (Signed)
Pt is weining her baby from breast feeding. She doesn't remember how many times a day that she needs to be down to before changing her BC.

## 2015-09-26 NOTE — Telephone Encounter (Signed)
Less than 2 times in 24 hours. I will go ahead and send in a different BC pill if that is what she is planning to use???

## 2015-09-26 NOTE — Telephone Encounter (Signed)
pls advise

## 2015-09-27 ENCOUNTER — Other Ambulatory Visit: Payer: Self-pay | Admitting: Obstetrics and Gynecology

## 2015-09-27 MED ORDER — NORETHIN ACE-ETH ESTRAD-FE 1-20 MG-MCG PO TABS: 1.0000 | ORAL_TABLET | Freq: Every day | ORAL | Status: DC

## 2015-09-27 NOTE — Telephone Encounter (Signed)
Yes she is still planning on taking OCP, go ahead and send in she only has 1 pill left

## 2015-10-05 ENCOUNTER — Other Ambulatory Visit: Payer: Self-pay | Admitting: Endocrinology

## 2016-01-07 ENCOUNTER — Other Ambulatory Visit: Payer: Self-pay | Admitting: Endocrinology

## 2016-01-11 ENCOUNTER — Ambulatory Visit (INDEPENDENT_AMBULATORY_CARE_PROVIDER_SITE_OTHER): Payer: BLUE CROSS/BLUE SHIELD | Admitting: Obstetrics and Gynecology

## 2016-01-11 ENCOUNTER — Encounter: Payer: Self-pay | Admitting: Obstetrics and Gynecology

## 2016-01-11 VITALS — BP 118/78 | HR 81 | Ht 65.0 in | Wt 207.0 lb

## 2016-01-11 DIAGNOSIS — N921 Excessive and frequent menstruation with irregular cycle: Secondary | ICD-10-CM | POA: Diagnosis not present

## 2016-01-11 DIAGNOSIS — Z01419 Encounter for gynecological examination (general) (routine) without abnormal findings: Secondary | ICD-10-CM | POA: Diagnosis not present

## 2016-01-11 DIAGNOSIS — E663 Overweight: Secondary | ICD-10-CM | POA: Diagnosis not present

## 2016-01-11 MED ORDER — NORGESTIMATE-ETH ESTRADIOL 0.25-35 MG-MCG PO TABS: 1.0000 | ORAL_TABLET | Freq: Every day | ORAL | Status: DC

## 2016-01-11 NOTE — Progress Notes (Signed)
Subjective:   Diane [name] is a 30 y.o. G1P0 Caucasian female here for a routine well-woman exam.  Patient's last menstrual period was 12/04/2015.    Current complaints: BTB in middle of pills PCP: none       does desire labs  Social History: Sexual: heterosexual Marital Status: married Living situation: with family Occupation: homemaker Tobacco/alcohol: no tobacco use Illicit drugs: no history of illicit drug use  The following portions of the patient's history were reviewed and updated as appropriate: allergies, current medications, past family history, past medical history, past social history, past surgical history and problem list.  Past Medical History Past Medical History  Diagnosis Date  . Endometriosis   . Ovarian cyst     Past Surgical History Past Surgical History  Procedure Laterality Date  . Laparoscopy  2009    Gynecologic History G1P0  Patient's last menstrual period was 12/04/2015. Contraception: OCP (estrogen/progesterone) Last Pap: 2016. Results were: normal   Obstetric History OB History  Gravida Para Term Preterm AB SAB TAB Ectopic Multiple Living  1         1    # Outcome Date GA Lbr Len/2nd Weight Sex Delivery Anes PTL Lv  1 Gravida 08/11/14    F Vag-Spont   Y      Current Medications Current Outpatient Prescriptions on File Prior to Visit  Medication Sig Dispense Refill  . levothyroxine (SYNTHROID, LEVOTHROID) 112 MCG tablet TAKE 1 TABLET(112 MCG) BY MOUTH DAILY BEFORE BREAKFAST 30 tablet 1  . norethindrone-ethinyl estradiol (JUNEL FE 1/20) 1-20 MG-MCG tablet Take 1 tablet by mouth daily. 1 Package 11  . Prenatal Vit-Fe Fumarate-FA (PRENATAL MULTIVITAMIN) TABS tablet Take 1 tablet by mouth daily at 12 noon.    Marland Kitchen. albuterol (PROVENTIL HFA;VENTOLIN HFA) 108 (90 BASE) MCG/ACT inhaler Inhale into the lungs every 6 (six) hours as needed for wheezing or shortness of breath.     No current facility-administered medications on file prior to  visit.    Review of Systems Patient denies any headaches, blurred vision, shortness of breath, chest pain, abdominal pain, problems with bowel movements, urination, or intercourse.  Objective:  BP 118/78 mmHg  Pulse 81  Ht 5\' 5"  (1.651 m)  Wt 207 lb (93.895 kg)  BMI 34.45 kg/m2  LMP 12/04/2015  Breastfeeding? No Physical Exam  General:  Well developed, well nourished, no acute distress. She is alert and oriented x3. Skin:  Warm and dry Neck:  Midline trachea, no thyromegaly or nodules Cardiovascular: Regular rate and rhythm, no murmur heard Lungs:  Effort normal, all lung fields clear to auscultation bilaterally Breasts:  No dominant palpable mass, retraction, or nipple discharge Abdomen:  Soft, non tender, no hepatosplenomegaly or masses Pelvic:  External genitalia is normal in appearance.  The vagina is normal in appearance. The cervix is bulbous, no CMT.  Thin prep pap is not done  . Uterus is felt to be normal size, shape, and contour.  No adnexal masses or tenderness noted. Extremities:  No swelling or varicosities noted Psych:  She has a normal mood and affect  Assessment:   Healthy well-woman exam Hypothyroidism BTB on current OCP overweight  Plan:  Labs obtained Will change OCPs F/U 1 year for AE, or sooner if needed   Melody Suzan NailerN Shambley, CNM

## 2016-01-11 NOTE — Patient Instructions (Signed)
  Place annual gynecologic exam patient instructions here.  Thank you for enrolling in MyChart. Please follow the instructions below to securely access your online medical record. MyChart allows you to send messages to your doctor, view your test results, manage appointments, and more.   How Do I Sign Up? 1. In your Internet browser, go to [employer] and enter [email or web address]. 2. Click on the Sign Up Now link in the Sign In box. You will see the New Member Sign Up page. 3. Enter your MyChart Access Code exactly as it appears below. You will not need to use this code after you've completed the sign-up process. If you do not sign up before the expiration date, you must request a new code.  MyChart Access Code: [email or web address] Expires: 03/10/2016 10:11 AM  4. Enter your Social Security Number ([id number]) and Date of Birth (mm/dd/yyyy) as indicated and click Submit. You will be taken to the next sign-up page. 5. Create a MyChart ID. This will be your MyChart login ID and cannot be changed, so think of one that is secure and easy to remember. 6. Create a MyChart password. You can change your password at any time. 7. Enter your Password Reset Question and Answer. This can be used at a later time if you forget your password.  8. Enter your e-mail address. You will receive e-mail notification when new information is available in MyChart. 9. Click Sign Up. You can now view your medical record.   Additional Information Remember, MyChart is NOT to be used for urgent needs. For medical emergencies, dial 911.

## 2016-01-12 LAB — COMPREHENSIVE METABOLIC PANEL
A/G RATIO: 1.8 (ref 1.2–2.2)
ALBUMIN: 4.5 g/dL (ref 3.5–5.5)
ALT: 8 IU/L (ref 0–32)
AST: 9 IU/L (ref 0–40)
Alkaline Phosphatase: 62 IU/L (ref 39–117)
BUN / CREAT RATIO: 15 (ref 9–23)
BUN: 10 mg/dL (ref 6–20)
Bilirubin Total: 0.2 mg/dL (ref 0.0–1.2)
CO2: 21 mmol/L (ref 18–29)
CREATININE: 0.65 mg/dL (ref 0.57–1.00)
Calcium: 8.5 mg/dL — ABNORMAL LOW (ref 8.7–10.2)
Chloride: 98 mmol/L (ref 96–106)
GFR calc Af Amer: 138 mL/min/{1.73_m2} (ref >59)
GFR, EST NON AFRICAN AMERICAN: 120 mL/min/{1.73_m2} (ref >59)
GLOBULIN, TOTAL: 2.5 g/dL (ref 1.5–4.5)
GLUCOSE: 87 mg/dL (ref 65–99)
POTASSIUM: 4.1 mmol/L (ref 3.5–5.2)
SODIUM: 137 mmol/L (ref 134–144)
Total Protein: 7 g/dL (ref 6.0–8.5)

## 2016-01-12 LAB — THYROID PANEL WITH TSH
FREE THYROXINE INDEX: 2.8 (ref 1.2–4.9)
T3 UPTAKE RATIO: 21 % — AB (ref 24–39)
T4 TOTAL: 13.2 ug/dL — AB (ref 4.5–12.0)
TSH: 1.36 u[IU]/mL (ref 0.450–4.500)

## 2016-01-12 LAB — LIPID PANEL
CHOL/HDL RATIO: 4.6 ratio — AB (ref 0.0–4.4)
Cholesterol, Total: 211 mg/dL — ABNORMAL HIGH (ref 100–199)
HDL: 46 mg/dL (ref >39)
LDL Calculated: 124 mg/dL — ABNORMAL HIGH (ref 0–99)
Triglycerides: 204 mg/dL — ABNORMAL HIGH (ref 0–149)
VLDL Cholesterol Cal: 41 mg/dL — ABNORMAL HIGH (ref 5–40)

## 2016-03-21 ENCOUNTER — Ambulatory Visit: Payer: BLUE CROSS/BLUE SHIELD | Admitting: Endocrinology

## 2016-03-21 ENCOUNTER — Ambulatory Visit (INDEPENDENT_AMBULATORY_CARE_PROVIDER_SITE_OTHER): Payer: BLUE CROSS/BLUE SHIELD | Admitting: Endocrinology

## 2016-03-21 VITALS — BP 118/72 | HR 78 | Ht 65.0 in | Wt 207.0 lb

## 2016-03-21 DIAGNOSIS — E061 Subacute thyroiditis: Secondary | ICD-10-CM | POA: Diagnosis not present

## 2016-03-21 LAB — TSH: TSH: 1.47 u[IU]/mL (ref 0.35–4.50)

## 2016-03-21 LAB — T4, FREE: Free T4: 0.81 ng/dL (ref 0.60–1.60)

## 2016-03-21 NOTE — Patient Instructions (Signed)
blood tests are requested for you today.  We'll let you know about the results.  Please come back for a follow-up appointment in 6 months, or sooner if another pregnancy happens.

## 2016-03-21 NOTE — Progress Notes (Signed)
   Subjective:    Patient ID: Diane [name], female    DOB: 1985-11-17, 30 y.o.   MRN: [id number]  HPI Pt returns for f/u of subacute thyroiditis (dx'ed mid-2016, when she presented with hyprethyroidism; she has never had thyroid imaging; she had a child in ealry 2016; she was rx'ed synthroid in late 2016).  pt states she feels well in general, except for fatigue.  She is not considering another pregnancy now.   Past Medical History:  Diagnosis Date  . Endometriosis   . Ovarian cyst     Past Surgical History:  Procedure Laterality Date  . LAPAROSCOPY  2009    Social History   Social History  . Marital status: Married    Spouse name: N/A  . Number of children: N/A  . Years of education: N/A   Occupational History  . Not on file.   Social History Main Topics  . Smoking status: Never Smoker  . Smokeless tobacco: Never Used  . Alcohol use No  . Drug use: No  . Sexual activity: Yes    Birth control/ protection: Pill   Other Topics Concern  . Not on file   Social History Narrative  . No narrative on file    Current Outpatient Prescriptions on File Prior to Visit  Medication Sig Dispense Refill  . albuterol (PROVENTIL HFA;VENTOLIN HFA) 108 (90 BASE) MCG/ACT inhaler Inhale into the lungs every 6 (six) hours as needed for wheezing or shortness of breath.    . levothyroxine (SYNTHROID, LEVOTHROID) 112 MCG tablet TAKE 1 TABLET(112 MCG) BY MOUTH DAILY BEFORE BREAKFAST 30 tablet 1  . norgestimate-ethinyl estradiol (ORTHO-CYCLEN,SPRINTEC,PREVIFEM) 0.25-35 MG-MCG tablet Take 1 tablet by mouth daily. 1 Package 11  . Prenatal Vit-Fe Fumarate-FA (PRENATAL MULTIVITAMIN) TABS tablet Take 1 tablet by mouth daily at 12 noon.     No current facility-administered medications on file prior to visit.     No Known Allergies  Family History  Problem Relation Age of Onset  . Diabetes Father   . Cancer Maternal Grandmother     colon, breast  . Heart disease Maternal Grandmother   .  Heart disease Paternal Grandfather   . Thyroid disease Neg Hx     Review of Systems No weight change    Objective:   Physical Exam VITAL SIGNS:  See vs page.  GENERAL: no distress.  NECK: There is no palpable thyroid enlargement.  No thyroid nodule is palpable.  No palpable lymphadenopathy at the anterior neck.   Lab Results  Component Value Date   TSH 1.360 01/11/2016   T4TOTAL 13.2 (H) 01/11/2016      Assessment & Plan:  Hypothyroidism: well-replaced.  Please continue the same medications.

## 2016-04-06 ENCOUNTER — Other Ambulatory Visit: Payer: Self-pay | Admitting: Endocrinology

## 2016-05-11 ENCOUNTER — Other Ambulatory Visit: Payer: Self-pay | Admitting: Endocrinology

## 2016-06-24 NOTE — L&D Delivery Note (Signed)
Delivery Note Primary OB: Willodean RosenthalM. Lawhorn, CNM Delivery Physician: Annamarie MajorPaul Harris, MD Gestational Age: Full term Antepartum complications: gestational diabetes Intrapartum complications: None  A viable Female was delivered via vertex perentation.  Apgars:9 ,9     I was called to see patient to help attend to delivery, as she quickly came to 10 cm dilation with early decels along with her contractions every 2 minutes; As she was showing rapid descent with each ctx she was allowed to push and soon delivered by OA presentation a viable female infant without complication.  Delayed cord clamping 1 minute.  Vigorous cry.  Midwife Lawhorn arrived and attended to the remainder of the delivery (placenta, aftercare).  Anesthesia:  epidural Episiotomy:  none  Mom to postpartum.  Baby to Couplet care / Skin to Skin   Please see Ardelle LeschesM Lawhorns note for completion of care/documentation.  Annamarie MajorPaul Harris, MD, Merlinda FrederickFACOG [facility], [facility] 06/19/2017  8:48 AM [phone]

## 2016-06-24 NOTE — L&D Delivery Note (Signed)
0845 In room to evaluate pt. Second stage attended by Annamarie MajorPaul Harris, MD. See note.   Infant on maternal abdomen. Delayed cord clamping. Three (3) vessel cord. Cord blood collected. APGARS: 9, 9. Weight: pending. Receiving nurse present at bedside for birth.   Pitocin infusing. Spontaneous delivery of intact placenta at 0849. Perineal laceration hemostatic, unrepaired. Vault check completed. Counts correct x 2. EBL: 200 ml. Epidural anesthesia.   Mom to postpartum. Infant skin to skin. Initiate routine postpartum care and orders.   FOB present at bedside and overjoyed with birth.    Diane BullaJenkins Michelle Turki [name], CNM [facility], [facility]

## 2016-06-25 ENCOUNTER — Other Ambulatory Visit: Payer: Self-pay | Admitting: Endocrinology

## 2016-07-24 ENCOUNTER — Other Ambulatory Visit: Payer: Self-pay | Admitting: Endocrinology

## 2016-08-25 ENCOUNTER — Other Ambulatory Visit: Payer: Self-pay | Admitting: Endocrinology

## 2016-09-18 ENCOUNTER — Ambulatory Visit (INDEPENDENT_AMBULATORY_CARE_PROVIDER_SITE_OTHER): Payer: BLUE CROSS/BLUE SHIELD | Admitting: Endocrinology

## 2016-09-18 ENCOUNTER — Encounter: Payer: Self-pay | Admitting: Endocrinology

## 2016-09-18 VITALS — BP 122/82 | HR 76 | Ht 65.0 in | Wt 204.0 lb

## 2016-09-18 DIAGNOSIS — E0590 Thyrotoxicosis, unspecified without thyrotoxic crisis or storm: Secondary | ICD-10-CM | POA: Diagnosis not present

## 2016-09-18 LAB — TSH: TSH: 0.41 u[IU]/mL (ref 0.35–4.50)

## 2016-09-18 LAB — T4, FREE: Free T4: 1.05 ng/dL (ref 0.60–1.60)

## 2016-09-18 NOTE — Progress Notes (Signed)
   Subjective:    Patient ID: Diane [name], female    DOB: 05/15/1986, 31 y.o.   MRN: [id number]  HPI Pt returns for f/u of subacute thyroiditis (dx'ed mid-2016, when she presented with hyprethyroidism; she has never had thyroid imaging; she had a child in ealry 2016; she was rx'ed synthroid in late 2016).  pt states she feels well in general, except for dry skin.  She is now trying for another pregnancy.  Past Medical History:  Diagnosis Date  . Endometriosis   . Ovarian cyst     Past Surgical History:  Procedure Laterality Date  . LAPAROSCOPY  2009    Social History   Social History  . Marital status: Married    Spouse name: N/A  . Number of children: N/A  . Years of education: N/A   Occupational History  . Not on file.   Social History Main Topics  . Smoking status: Never Smoker  . Smokeless tobacco: Never Used  . Alcohol use No  . Drug use: No  . Sexual activity: Yes    Birth control/ protection: Pill   Other Topics Concern  . Not on file   Social History Narrative  . No narrative on file    Current Outpatient Prescriptions on File Prior to Visit  Medication Sig Dispense Refill  . albuterol (PROVENTIL HFA;VENTOLIN HFA) 108 (90 BASE) MCG/ACT inhaler Inhale into the lungs every 6 (six) hours as needed for wheezing or shortness of breath.    . levothyroxine (SYNTHROID, LEVOTHROID) 112 MCG tablet TAKE 1 TABLET(112 MCG) BY MOUTH DAILY BEFORE BREAKFAST 30 tablet 0  . Prenatal Vit-Fe Fumarate-FA (PRENATAL MULTIVITAMIN) TABS tablet Take 1 tablet by mouth daily at 12 noon.     No current facility-administered medications on file prior to visit.     No Known Allergies  Family History  Problem Relation Age of Onset  . Diabetes Father   . Cancer Maternal Grandmother     colon, breast  . Heart disease Maternal Grandmother   . Heart disease Paternal Grandfather   . Thyroid disease Neg Hx     BP 122/82   Pulse 76   Ht 5\' 5"  (1.651 m)   Wt 204 lb (92.5 kg)    SpO2 95%   BMI 33.95 kg/m    Review of Systems Depression is much better.     Objective:   Physical Exam VITAL SIGNS:  See vs page GENERAL: no distress NECK: There is no palpable thyroid enlargement.  No thyroid nodule is palpable.  No palpable lymphadenopathy at the anterior neck.  Lab Results  Component Value Date   TSH 0.41 09/18/2016   T4TOTAL 13.2 (H) 01/11/2016      Assessment & Plan:  Chronic hypothyroidism: well-replaced.  Please continue the same medication. Fertility: call right away if pregnancy happens

## 2016-09-18 NOTE — Patient Instructions (Addendum)
blood tests are requested for you today.  We'll let you know about the results.  Please come back for a follow-up appointment in 6 months.  If the pregnancy is know or suspected, call us right away.

## 2016-09-27 ENCOUNTER — Encounter: Payer: Self-pay | Admitting: Medical

## 2016-09-27 ENCOUNTER — Ambulatory Visit: Payer: Self-pay | Admitting: Medical

## 2016-09-27 VITALS — BP 112/76 | HR 86 | Temp 98.0°F | Resp 16

## 2016-09-27 DIAGNOSIS — J0100 Acute maxillary sinusitis, unspecified: Secondary | ICD-10-CM

## 2016-09-27 MED ORDER — AMOXICILLIN-POT CLAVULANATE 875-125 MG PO TABS: 1.0000 | ORAL_TABLET | Freq: Two times a day (BID) | ORAL | 0 refills | Status: AC

## 2016-09-27 NOTE — Progress Notes (Signed)
Patient complains of maxillary and frontal sinus pressure and sinus headache for 1-2 weeks.  Taking Advil OTC.  Intermittent Left ear pain.

## 2016-09-27 NOTE — Progress Notes (Signed)
   Subjective:    Patient ID: Diane [name], female    DOB: [birth year], 31 y.o.   MRN: [id number]  HPI  31 yo female started 2 weeks ago with pain and pressure in frontal and maxillary sinuses. No discharge from nose. No known fever or chills. Patient brought 31 yr old with her.     Review of Systems  Constitutional: Negative for chills and fever.  HENT: Positive for sinus pain and sinus pressure. Negative for congestion and rhinorrhea.   Eyes: Negative.   Respiratory: Positive for cough. Negative for shortness of breath and wheezing.   Hematological: Negative for adenopathy.   Complains of mild cough , nonproductive.    Objective:   Physical Exam  Constitutional: She is oriented to person, place, and time. She appears well-developed and well-nourished.  HENT:  Head: Normocephalic and atraumatic.  Right Ear: External ear normal.  Left Ear: External ear normal. Tympanic membrane is not erythematous. A middle ear effusion is present.  Nose: Mucosal edema present. Right sinus exhibits maxillary sinus tenderness.  Mouth/Throat: Uvula is midline, oropharynx is clear and moist and mucous membranes are normal.  Eyes: Conjunctivae and EOM are normal. Pupils are equal, round, and reactive to light.  Neck: Normal range of motion. Neck supple.  Cardiovascular: Normal rate and regular rhythm.  Exam reveals no gallop and no friction rub.   No murmur heard. Pulmonary/Chest: Effort normal and breath sounds normal.  Lymphadenopathy:    She has no cervical adenopathy.  Neurological: She is alert and oriented to person, place, and time.  Skin: Skin is warm and dry.  Psychiatric: She has a normal mood and affect. Her behavior is normal.  Nursing note and vitals reviewed.  Erythema in nostrils  Right > left Clearing throat in room.       Assessment & Plan:  Sinusitis prescribed Augmenting  875 mg one by mouth twice daily x  10 day with food. #20 no refill. Eustachian tube  dysfunction. Rest , increase fluids , otc motrin or tylenol  take as directed. otc flonase for nasal congestion take as directed. otc claritin or zyrtec take as directed. Patient denies pregnancy. Return to clinic in 3-7 days if not improving.

## 2016-10-23 ENCOUNTER — Other Ambulatory Visit: Payer: Self-pay | Admitting: Endocrinology

## 2016-10-24 ENCOUNTER — Encounter: Payer: Self-pay | Admitting: Endocrinology

## 2016-10-25 ENCOUNTER — Ambulatory Visit (INDEPENDENT_AMBULATORY_CARE_PROVIDER_SITE_OTHER): Payer: BLUE CROSS/BLUE SHIELD | Admitting: Obstetrics and Gynecology

## 2016-10-25 ENCOUNTER — Encounter: Payer: Self-pay | Admitting: Obstetrics and Gynecology

## 2016-10-25 VITALS — BP 132/72 | HR 89 | Ht 65.0 in | Wt 207.2 lb

## 2016-10-25 DIAGNOSIS — N926 Irregular menstruation, unspecified: Secondary | ICD-10-CM | POA: Diagnosis not present

## 2016-10-25 DIAGNOSIS — Z3A37 37 weeks gestation of pregnancy: Secondary | ICD-10-CM | POA: Diagnosis not present

## 2016-10-25 DIAGNOSIS — Z3483 Encounter for supervision of other normal pregnancy, third trimester: Secondary | ICD-10-CM | POA: Diagnosis not present

## 2016-10-25 DIAGNOSIS — Z3480 Encounter for supervision of other normal pregnancy, unspecified trimester: Secondary | ICD-10-CM | POA: Diagnosis not present

## 2016-10-25 LAB — POCT URINE PREGNANCY: Preg Test, Ur: POSITIVE — AB

## 2016-10-25 NOTE — Progress Notes (Signed)
Subjective:     Patient ID: Diane [name], female   DOB: Aug 08, 1985, 31 y.o.   MRN: [id number]  HPI Here for pregnancy confirmation, reports LMP 09/17/16 giving EDC 06/24/17 and EGA [id number]. Stopped OCPs in January. Feels fine at this time. G1P1  Review of Systems Negative except missed menses    Objective:   Physical Exam A&O x4 Well groomed female\Blood pressure 132/72, pulse 89, height 5\' 5"  (1.651 m), weight 207 lb 3.2 oz (94 kg), last menstrual period 09/17/2016. UPT+ PE not indicated    Assessment:     Missed menses Thyroid disorder    Plan:     Viability scan and Nurse intake in 2 weeks then NOB PE in 6 weeks.  Diane [name], CNM

## 2016-11-11 ENCOUNTER — Other Ambulatory Visit: Payer: Self-pay | Admitting: Obstetrics and Gynecology

## 2016-11-11 ENCOUNTER — Ambulatory Visit: Payer: BLUE CROSS/BLUE SHIELD | Admitting: Obstetrics and Gynecology

## 2016-11-11 ENCOUNTER — Ambulatory Visit (INDEPENDENT_AMBULATORY_CARE_PROVIDER_SITE_OTHER): Payer: BLUE CROSS/BLUE SHIELD

## 2016-11-11 VITALS — BP 128/74 | HR 73 | Wt 205.5 lb

## 2016-11-11 DIAGNOSIS — Z3480 Encounter for supervision of other normal pregnancy, unspecified trimester: Secondary | ICD-10-CM

## 2016-11-11 DIAGNOSIS — N926 Irregular menstruation, unspecified: Secondary | ICD-10-CM

## 2016-11-11 NOTE — Progress Notes (Signed)
Diane [name] presents for NOB nurse interview visit. Pregnancy confirmation done __5/4/18____.  G- . 2 P- .1 Pregnancy education material explained and given. __0_ cats in the home. NOB labs order. HIV labs and Drug screen were explained optional and she did not decline. Drug screen ordered.PNV encouraged. Genetic screening options discussed. Genetic testing: declined Pt may discuss with provider. Pt. To follow up with provider in _5_ weeks for NOB physical.  All questions answered. Pt will return for labs at 9 weeks.

## 2016-11-14 ENCOUNTER — Encounter: Payer: Self-pay | Admitting: Endocrinology

## 2016-11-17 ENCOUNTER — Other Ambulatory Visit: Payer: Self-pay | Admitting: Endocrinology

## 2016-11-19 ENCOUNTER — Encounter: Payer: Self-pay | Admitting: Obstetrics and Gynecology

## 2016-12-10 ENCOUNTER — Encounter: Payer: BLUE CROSS/BLUE SHIELD | Admitting: Obstetrics and Gynecology

## 2016-12-11 ENCOUNTER — Encounter: Payer: Self-pay | Admitting: Obstetrics and Gynecology

## 2016-12-11 ENCOUNTER — Ambulatory Visit (INDEPENDENT_AMBULATORY_CARE_PROVIDER_SITE_OTHER): Payer: BLUE CROSS/BLUE SHIELD | Admitting: Obstetrics and Gynecology

## 2016-12-11 ENCOUNTER — Other Ambulatory Visit: Payer: BLUE CROSS/BLUE SHIELD

## 2016-12-11 VITALS — BP 118/80 | HR 84 | Ht 65.0 in | Wt 207.5 lb

## 2016-12-11 DIAGNOSIS — J208 Acute bronchitis due to other specified organisms: Secondary | ICD-10-CM

## 2016-12-11 DIAGNOSIS — Z3480 Encounter for supervision of other normal pregnancy, unspecified trimester: Secondary | ICD-10-CM | POA: Diagnosis not present

## 2016-12-11 MED ORDER — PSEUDOEPHEDRINE HCL ER 120 MG PO TB12: 120.0000 mg | ORAL_TABLET | Freq: Two times a day (BID) | ORAL | 0 refills | Status: DC

## 2016-12-11 MED ORDER — ALBUTEROL SULFATE HFA 108 (90 BASE) MCG/ACT IN AERS: 2.0000 | INHALATION_SPRAY | Freq: Four times a day (QID) | RESPIRATORY_TRACT | 2 refills | Status: DC | PRN

## 2016-12-11 MED ORDER — HYDROCOD POLST-CPM POLST ER 10-8 MG/5ML PO SUER: 5.0000 mL | Freq: Two times a day (BID) | ORAL | 0 refills | Status: DC | PRN

## 2016-12-11 NOTE — Progress Notes (Signed)
Subjective:     Patient ID: Diane [name], female   DOB: May 23, 1986, 31 y.o.   MRN: [id number]  HPI Started with dry cough about 8-10 days ago, gotten worse, has to sleep in recliner at night. Now notes clear phlegm, denies fever. Does have frontal and maxillary sinus pressure. Is currently [redacted] weeks pregnant.  Review of Systems Negative except stated above in HPI    Objective:   Physical Exam A&Ox4 Well groomed female Blood pressure 118/80, pulse 84, height '5\' 5"'  (1.651 m), weight 207 lb 8 oz (94.1 kg), last menstrual period 09/17/2016. HRR Lungs with expiratory wheezing bilaterally Bilateral sinus tenderness with both ears clear and throat without redness or lesions. Nasal passages clear.     Assessment:     Acute viral bronchiolitis 10+ weeks pregnant    Plan:     Counseled on findings rx sent in for Tussionex; sudafed, and ProAir inhaler. RTC next week for scheduled appointment.  Anahid Eskelson,CNM

## 2016-12-12 LAB — CBC WITH DIFFERENTIAL
BASOS ABS: 0 10*3/uL (ref 0.0–0.2)
Basos: 1 %
EOS (ABSOLUTE): 0.2 10*3/uL (ref 0.0–0.4)
EOS: 3 %
HEMOGLOBIN: 12 g/dL (ref 11.1–15.9)
Hematocrit: 36.7 % (ref 34.0–46.6)
IMMATURE GRANS (ABS): 0 10*3/uL (ref 0.0–0.1)
IMMATURE GRANULOCYTES: 0 %
LYMPHS ABS: 1.3 10*3/uL (ref 0.7–3.1)
LYMPHS: 22 %
MCH: 27.6 pg (ref 26.6–33.0)
MCHC: 32.7 g/dL (ref 31.5–35.7)
MCV: 85 fL (ref 79–97)
MONOCYTES: 7 %
Monocytes Absolute: 0.4 10*3/uL (ref 0.1–0.9)
Neutrophils Absolute: 4.2 10*3/uL (ref 1.4–7.0)
Neutrophils: 67 %
RBC: 4.34 x10E6/uL (ref 3.77–5.28)
RDW: 13.4 % (ref 12.3–15.4)
WBC: 6.2 10*3/uL (ref 3.4–10.8)

## 2016-12-12 LAB — VARICELLA ZOSTER ANTIBODY, IGG: VARICELLA: 1536 {index} (ref >165)

## 2016-12-12 LAB — MONITOR DRUG PROFILE 14(MW)
AMPHETAMINE SCREEN URINE: NEGATIVE ng/mL
BARBITURATE SCREEN URINE: NEGATIVE ng/mL
BENZODIAZEPINE SCREEN, URINE: NEGATIVE ng/mL
Buprenorphine, Urine: NEGATIVE ng/mL
CANNABINOIDS UR QL SCN: NEGATIVE ng/mL
COCAINE(METAB.)SCREEN, URINE: NEGATIVE ng/mL
CREATININE(CRT), U: 57.2 mg/dL (ref 20.0–300.0)
Fentanyl, Urine: NEGATIVE pg/mL
MEPERIDINE SCREEN, URINE: NEGATIVE ng/mL
METHADONE SCREEN, URINE: NEGATIVE ng/mL
OPIATE SCREEN URINE: NEGATIVE ng/mL
OXYCODONE+OXYMORPHONE UR QL SCN: NEGATIVE ng/mL
PROPOXYPHENE SCREEN URINE: NEGATIVE ng/mL
Ph of Urine: 6.1 (ref 4.5–8.9)
Phencyclidine Qn, Ur: NEGATIVE ng/mL
SPECIFIC GRAVITY: 1.005
TRAMADOL SCREEN, URINE: NEGATIVE ng/mL

## 2016-12-12 LAB — URINALYSIS, ROUTINE W REFLEX MICROSCOPIC
BILIRUBIN UA: NEGATIVE
GLUCOSE, UA: NEGATIVE
Ketones, UA: NEGATIVE
LEUKOCYTES UA: NEGATIVE
Nitrite, UA: NEGATIVE
Protein, UA: NEGATIVE
RBC UA: NEGATIVE
Specific Gravity, UA: 1.008 (ref 1.005–1.030)
UUROB: 0.2 mg/dL (ref 0.2–1.0)
pH, UA: 6.5 (ref 5.0–7.5)

## 2016-12-12 LAB — ABO AND RH: RH TYPE: POSITIVE

## 2016-12-12 LAB — NICOTINE SCREEN, URINE: Cotinine Ql Scrn, Ur: NEGATIVE ng/mL

## 2016-12-12 LAB — RUBELLA SCREEN: RUBELLA: 1.87 {index} (ref >0.99)

## 2016-12-12 LAB — HEMOGLOBIN A1C
Est. average glucose Bld gHb Est-mCnc: 108 mg/dL
Hgb A1c MFr Bld: 5.4 % (ref 4.8–5.6)

## 2016-12-12 LAB — HEPATITIS B SURFACE ANTIGEN: Hepatitis B Surface Ag: NEGATIVE

## 2016-12-12 LAB — RPR: RPR: NONREACTIVE

## 2016-12-12 LAB — TSH: TSH: 0.563 u[IU]/mL (ref 0.450–4.500)

## 2016-12-12 LAB — HIV ANTIBODY (ROUTINE TESTING W REFLEX): HIV SCREEN 4TH GENERATION: NONREACTIVE

## 2016-12-12 LAB — ANTIBODY SCREEN: ANTIBODY SCREEN: NEGATIVE

## 2016-12-13 ENCOUNTER — Other Ambulatory Visit: Payer: Self-pay | Admitting: Endocrinology

## 2016-12-17 ENCOUNTER — Encounter: Payer: Self-pay | Admitting: Obstetrics and Gynecology

## 2016-12-17 ENCOUNTER — Ambulatory Visit (INDEPENDENT_AMBULATORY_CARE_PROVIDER_SITE_OTHER): Payer: BLUE CROSS/BLUE SHIELD | Admitting: Obstetrics and Gynecology

## 2016-12-17 VITALS — BP 118/73 | HR 80 | Wt 209.0 lb

## 2016-12-17 DIAGNOSIS — Z3401 Encounter for supervision of normal first pregnancy, first trimester: Secondary | ICD-10-CM

## 2016-12-17 LAB — POCT URINALYSIS DIPSTICK
BILIRUBIN UA: NEGATIVE
GLUCOSE UA: NEGATIVE
Ketones, UA: NEGATIVE
Leukocytes, UA: NEGATIVE
NITRITE UA: NEGATIVE
Protein, UA: NEGATIVE
RBC UA: NEGATIVE
SPEC GRAV UA: 1.01 (ref 1.010–1.025)
Urobilinogen, UA: 0.2 E.U./dL
pH, UA: 7 (ref 5.0–8.0)

## 2016-12-17 NOTE — Patient Instructions (Signed)

## 2016-12-17 NOTE — Progress Notes (Signed)
NEW OB HISTORY AND PHYSICAL  SUBJECTIVE:       Diane [name] is a 31 y.o. G2P0 female, Patient's last menstrual period was 09/17/2016., Estimated Date of Delivery: 07/03/17, 1559w5d, presents today for establishment of Prenatal Care. She has no unusual complaints and complains of resolving cough with mild nausea      Gynecologic History Patient's last menstrual period was 09/17/2016. Normal Contraception: none Last Pap: 2017. Results were: normal  Obstetric History OB History  Gravida Para Term Preterm AB Living  2         1  SAB TAB Ectopic Multiple Live Births          1    # Outcome Date GA Lbr Len/2nd Weight Sex Delivery Anes PTL Lv  2 Current           1 Gravida 08/11/14    F Vag-Spont   LIV      Past Medical History:  Diagnosis Date  . Endometriosis   . Hyperthyroidism   . Ovarian cyst   . Thyroiditis     Past Surgical History:  Procedure Laterality Date  . LAPAROSCOPY  2009    Current Outpatient Prescriptions on File Prior to Visit  Medication Sig Dispense Refill  . albuterol (PROAIR HFA) 108 (90 Base) MCG/ACT inhaler Inhale 2 puffs into the lungs every 6 (six) hours as needed for wheezing or shortness of breath. 1 Inhaler 2  . chlorpheniramine-HYDROcodone (TUSSIONEX PENNKINETIC ER) 10-8 MG/5ML SUER Take 5 mLs by mouth every 12 (twelve) hours as needed for cough. 140 mL 0  . levothyroxine (SYNTHROID, LEVOTHROID) 112 MCG tablet TAKE 1 TABLET(112 MCG) BY MOUTH DAILY BEFORE BREAKFAST 30 tablet 0  . Prenatal Vit-Fe Fumarate-FA (PRENATAL MULTIVITAMIN) TABS tablet Take 1 tablet by mouth daily at 12 noon.    . pseudoephedrine (SUDAFED 12 HOUR) 120 MG 12 hr tablet Take 1 tablet (120 mg total) by mouth 2 (two) times daily. (Patient not taking: Reported on 12/17/2016) 30 tablet 0   No current facility-administered medications on file prior to visit.     No Known Allergies  Social History   Social History  . Marital status: Married    Spouse name: N/A  . Number  of children: N/A  . Years of education: N/A   Occupational History  . Not on file.   Social History Main Topics  . Smoking status: Never Smoker  . Smokeless tobacco: Never Used  . Alcohol use No  . Drug use: No  . Sexual activity: Yes    Birth control/ protection: None   Other Topics Concern  . Not on file   Social History Narrative  . No narrative on file    Family History  Problem Relation Age of Onset  . Diabetes Father   . Cancer Maternal Grandmother        colon, breast  . Heart disease Maternal Grandmother   . Heart disease Paternal Grandfather   . Thyroid disease Neg Hx     The following portions of the patient's history were reviewed and updated as appropriate: allergies, current medications, past OB history, past medical history, past surgical history, past family history, past social history, and problem list.    OBJECTIVE: Initial Physical Exam (New OB)  GENERAL APPEARANCE: alert, well appearing, in no apparent distress, overweight HEAD: normocephalic, atraumatic MOUTH: mucous membranes moist, pharynx normal without lesions and dental hygiene good THYROID: no thyromegaly or masses present BREASTS: not examined LUNGS: clear to auscultation, no wheezes, rales  or rhonchi, symmetric air entry HEART: regular rate and rhythm, no murmurs ABDOMEN: soft, nontender, nondistended, no abnormal masses, no epigastric pain, obese, fundus not palpable and FHT present EXTREMITIES: no redness or tenderness in the calves or thighs SKIN: normal coloration and turgor, no rashes LYMPH NODES: no adenopathy palpable NEUROLOGIC: alert, oriented, normal speech, no focal findings or movement disorder noted  PELVIC EXAM not indicated  ASSESSMENT: Normal pregnancy BMI 34 Hyperthyroidism   PLAN: Prenatal care Declined genetic screening Early glucola at 16 weeks TSH each trimester. See orders

## 2016-12-17 NOTE — Progress Notes (Signed)
NOB physical- pt is doing well, cough is getting better

## 2017-01-13 ENCOUNTER — Other Ambulatory Visit: Payer: BLUE CROSS/BLUE SHIELD

## 2017-01-13 ENCOUNTER — Ambulatory Visit (INDEPENDENT_AMBULATORY_CARE_PROVIDER_SITE_OTHER): Payer: BLUE CROSS/BLUE SHIELD | Admitting: Certified Nurse Midwife

## 2017-01-13 ENCOUNTER — Encounter: Payer: Self-pay | Admitting: Certified Nurse Midwife

## 2017-01-13 VITALS — BP 125/72 | HR 77 | Wt 209.5 lb

## 2017-01-13 DIAGNOSIS — Z3492 Encounter for supervision of normal pregnancy, unspecified, second trimester: Secondary | ICD-10-CM

## 2017-01-13 DIAGNOSIS — Z3689 Encounter for other specified antenatal screening: Secondary | ICD-10-CM

## 2017-01-13 LAB — POCT URINALYSIS DIPSTICK
BILIRUBIN UA: NEGATIVE
Blood, UA: NEGATIVE
GLUCOSE UA: NEGATIVE
Ketones, UA: NEGATIVE
Leukocytes, UA: NEGATIVE
Nitrite, UA: NEGATIVE
Protein, UA: NEGATIVE
Spec Grav, UA: 1.01 (ref 1.010–1.025)
UROBILINOGEN UA: 0.2 U/dL
pH, UA: 6.5 (ref 5.0–8.0)

## 2017-01-13 MED ORDER — MAGNESIUM OXIDE 400 MG PO TABS: 400.0000 mg | ORAL_TABLET | Freq: Two times a day (BID) | ORAL | 3 refills | Status: DC

## 2017-01-13 NOTE — Progress Notes (Signed)
ROB- early glucola, pt is doing well

## 2017-01-13 NOTE — Patient Instructions (Signed)

## 2017-01-14 ENCOUNTER — Encounter: Payer: BLUE CROSS/BLUE SHIELD | Admitting: Obstetrics and Gynecology

## 2017-01-14 LAB — GLUCOSE, 1 HOUR GESTATIONAL: GESTATIONAL DIABETES SCREEN: 98 mg/dL (ref 65–139)

## 2017-01-16 ENCOUNTER — Encounter: Payer: BLUE CROSS/BLUE SHIELD | Admitting: Obstetrics and Gynecology

## 2017-01-17 ENCOUNTER — Other Ambulatory Visit: Payer: Self-pay | Admitting: Endocrinology

## 2017-01-17 NOTE — Progress Notes (Addendum)
ROB-Pt doing well, reports round ligament pain and increased headaches. Discussed home treatment measures including abdominal support and OTC medications-literature provided. Rx: Magnesium oxide, see orders. Early glucola today for BMI>30. Reviewed red flag symptoms and when to call. RTC x 4 weeks for anatomy scan and ROB.

## 2017-02-06 ENCOUNTER — Encounter: Payer: Self-pay | Admitting: Obstetrics and Gynecology

## 2017-02-11 ENCOUNTER — Ambulatory Visit (INDEPENDENT_AMBULATORY_CARE_PROVIDER_SITE_OTHER): Payer: BLUE CROSS/BLUE SHIELD

## 2017-02-11 ENCOUNTER — Ambulatory Visit (INDEPENDENT_AMBULATORY_CARE_PROVIDER_SITE_OTHER): Payer: BLUE CROSS/BLUE SHIELD | Admitting: Obstetrics and Gynecology

## 2017-02-11 VITALS — BP 119/78 | HR 80 | Wt 211.0 lb

## 2017-02-11 DIAGNOSIS — Z3492 Encounter for supervision of normal pregnancy, unspecified, second trimester: Secondary | ICD-10-CM | POA: Diagnosis not present

## 2017-02-11 DIAGNOSIS — Z3689 Encounter for other specified antenatal screening: Secondary | ICD-10-CM

## 2017-02-11 LAB — POCT URINALYSIS DIPSTICK
BILIRUBIN UA: NEGATIVE
Glucose, UA: NEGATIVE
Ketones, UA: NEGATIVE
LEUKOCYTES UA: NEGATIVE
Nitrite, UA: NEGATIVE
PH UA: 6.5 (ref 5.0–8.0)
Protein, UA: NEGATIVE
RBC UA: NEGATIVE
Spec Grav, UA: 1.01 (ref 1.010–1.025)
Urobilinogen, UA: 0.2 E.U./dL

## 2017-02-11 NOTE — Progress Notes (Signed)
ROB-reports increased urinary frequency x 4 weeks. Indications:Anatomy U/S Findings:  Singleton intrauterine pregnancy is visualized with FHR at 152 BPM. Biometrics give an (U/S) Gestational age of 31 3/7 weeks and an (U/S) EDD of 06/28/17; this correlates with the clinically established EDD of 07/03/17.  Fetal presentation is Vertex.  EFW: 348g (12oz). Placenta: Posterior, grade 0, 5.6 cm from internal os, central cord insertion. AFI: Adequate with MVP of 3.8 cm.  Anatomic survey is incomplete and normal; Gender - female  . Profile is suboptimal due to fetal position.  Ovaries are not seen. Survey of the adnexa demonstrates no adnexal masses. There is no free peritoneal fluid in the cul de sac.  Impression: 1. 20 3/7 week Viable Singleton Intrauterine pregnancy by U/S. 2. (U/S) EDD is consistent with Clinically established (LMP) EDD of 07/03/17. 3. Incomplete Anatomy Scan  Recommendations: 1.Clinical correlation with the patient's History and Physical Exam. 2. Return in 1-2 weeks for anatomy follow up

## 2017-02-11 NOTE — Progress Notes (Signed)
ROB- pt had anatomy scan done today, pt is doing well 

## 2017-02-14 ENCOUNTER — Other Ambulatory Visit: Payer: Self-pay | Admitting: Endocrinology

## 2017-03-13 ENCOUNTER — Other Ambulatory Visit: Payer: Self-pay | Admitting: Endocrinology

## 2017-03-18 ENCOUNTER — Ambulatory Visit (INDEPENDENT_AMBULATORY_CARE_PROVIDER_SITE_OTHER): Payer: BLUE CROSS/BLUE SHIELD | Admitting: Certified Nurse Midwife

## 2017-03-18 ENCOUNTER — Ambulatory Visit (INDEPENDENT_AMBULATORY_CARE_PROVIDER_SITE_OTHER): Payer: BLUE CROSS/BLUE SHIELD

## 2017-03-18 ENCOUNTER — Encounter: Payer: Self-pay | Admitting: Certified Nurse Midwife

## 2017-03-18 VITALS — BP 124/87 | HR 82 | Wt 220.4 lb

## 2017-03-18 DIAGNOSIS — Z131 Encounter for screening for diabetes mellitus: Secondary | ICD-10-CM

## 2017-03-18 DIAGNOSIS — Z3492 Encounter for supervision of normal pregnancy, unspecified, second trimester: Secondary | ICD-10-CM

## 2017-03-18 DIAGNOSIS — Z130 Encounter for screening for diseases of the blood and blood-forming organs and certain disorders involving the immune mechanism: Secondary | ICD-10-CM

## 2017-03-18 LAB — POCT URINALYSIS DIPSTICK
Bilirubin, UA: NEGATIVE
Glucose, UA: NEGATIVE
Ketones, UA: NEGATIVE
Leukocytes, UA: NEGATIVE
Nitrite, UA: NEGATIVE
PROTEIN UA: NEGATIVE
RBC UA: NEGATIVE
SPEC GRAV UA: 1.02 (ref 1.010–1.025)
UROBILINOGEN UA: 0.2 U/dL
pH, UA: 6 (ref 5.0–8.0)

## 2017-03-18 NOTE — Patient Instructions (Signed)

## 2017-03-18 NOTE — Progress Notes (Signed)
ROB doing well. Follow up anatomy scan today. See below. Complains of constipation, discussed increased water, fiber, stool softner, prune juice. Discussed next visit 1 hr GTT, CBC, TDAP and BTC. Follow up in 4 wks.   Diane [name]< CNM  ULTRASOUND REPORT  Location: [facility] Date of Service: 03/18/17  Indications: F/U Anatomy Findings:  Singleton intrauterine pregnancy is visualized with FHR at 136 BPM.  Fetal presentation is breech, spine posterior.  Placenta: Posterior, grade 1. AFI: Subjectively adequate with an MVP of 4.2 cm  Anatomic survey is now complete with the acquisition of the fetal profile which is seen and appears WNL. Also surveyed were the fetal lateral ventricle, csp/thalamus, posterior fossa, stomach, bladder and kidneys and all appear WNL. Gender - Female.    Impression: 1. Anatomy scan is now complete with the acquisition of the fetal profile which appears WNL.   Recommendations: 1.Clinical correlation with the patient's History and Physical Exam.  Revonda Humphrey, RDMS, RVT

## 2017-03-21 ENCOUNTER — Ambulatory Visit (INDEPENDENT_AMBULATORY_CARE_PROVIDER_SITE_OTHER): Payer: BLUE CROSS/BLUE SHIELD | Admitting: Endocrinology

## 2017-03-21 ENCOUNTER — Ambulatory Visit: Payer: BLUE CROSS/BLUE SHIELD | Admitting: Endocrinology

## 2017-03-21 ENCOUNTER — Encounter: Payer: Self-pay | Admitting: Endocrinology

## 2017-03-21 VITALS — BP 112/82 | HR 112 | Wt 218.9 lb

## 2017-03-21 DIAGNOSIS — E0590 Thyrotoxicosis, unspecified without thyrotoxic crisis or storm: Secondary | ICD-10-CM | POA: Diagnosis not present

## 2017-03-21 LAB — TSH: TSH: 0.66 u[IU]/mL (ref 0.35–4.50)

## 2017-03-21 LAB — T4, FREE: FREE T4: 0.7 ng/dL (ref 0.60–1.60)

## 2017-03-21 NOTE — Patient Instructions (Addendum)
blood tests are requested for you today.  We'll let you know about the results.   Please come back for a follow-up appointment in 2 months.  

## 2017-03-21 NOTE — Progress Notes (Signed)
   Subjective:    Patient ID: Diane [name], female    DOB: [birth year], 31 y.o.   MRN: [id number]  HPI Pt returns for f/u of subacute thyroiditis (dx'ed mid-2016, when she presented with hyprethyroidism; she has never had thyroid imaging; she had a child in ealry 2016; she was rx'ed synthroid in late 2016).  pt states she feels well in general.  She is now at [redacted] weeks gestation.  She has gained 10 lbs so far.   Past Medical History:  Diagnosis Date  . Endometriosis   . Hyperthyroidism   . Ovarian cyst   . Thyroiditis     Past Surgical History:  Procedure Laterality Date  . LAPAROSCOPY  2009    Social History   Social History  . Marital status: Married    Spouse name: N/A  . Number of children: N/A  . Years of education: N/A   Occupational History  . Not on file.   Social History Main Topics  . Smoking status: Never Smoker  . Smokeless tobacco: Never Used  . Alcohol use No  . Drug use: No  . Sexual activity: Yes    Birth control/ protection: None   Other Topics Concern  . Not on file   Social History Narrative  . No narrative on file    Current Outpatient Prescriptions on File Prior to Visit  Medication Sig Dispense Refill  . albuterol (PROAIR HFA) 108 (90 Base) MCG/ACT inhaler Inhale 2 puffs into the lungs every 6 (six) hours as needed for wheezing or shortness of breath. 1 Inhaler 2  . levothyroxine (SYNTHROID, LEVOTHROID) 112 MCG tablet TAKE 1 TABLET(112 MCG) BY MOUTH DAILY BEFORE BREAKFAST 30 tablet 0  . magnesium oxide (MAG-OX) 400 MG tablet Take 1 tablet (400 mg total) by mouth 2 (two) times daily. 60 tablet 3  . Prenatal Vit-Fe Fumarate-FA (PRENATAL MULTIVITAMIN) TABS tablet Take 1 tablet by mouth daily at 12 noon.     No current facility-administered medications on file prior to visit.     No Known Allergies  Family History  Problem Relation Age of Onset  . Diabetes Father   . Cancer Maternal Grandmother        colon, breast  . Heart disease  Maternal Grandmother   . Heart disease Paternal Grandfather   . Thyroid disease Neg Hx     BP 112/82   Pulse (!) 112   Wt 218 lb 14.4 oz (99.3 kg)   LMP 09/17/2016   SpO2 97%   BMI 36.43 kg/m    Review of Systems Denies tremor.     Objective:   Physical Exam VITAL SIGNS:  See vs page GENERAL: no distress.  NECK: There is no palpable thyroid enlargement.  No thyroid nodule is palpable.  No palpable lymphadenopathy at the anterior neck.        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 Pregnancy: new.   Patient Instructions  blood tests are requested for you today.  We'll let you know about the results.   Please come back for a follow-up appointment in 2 months.

## 2017-04-16 ENCOUNTER — Other Ambulatory Visit: Payer: BLUE CROSS/BLUE SHIELD

## 2017-04-16 ENCOUNTER — Encounter: Payer: BLUE CROSS/BLUE SHIELD | Admitting: Obstetrics and Gynecology

## 2017-04-16 ENCOUNTER — Ambulatory Visit (INDEPENDENT_AMBULATORY_CARE_PROVIDER_SITE_OTHER): Payer: BLUE CROSS/BLUE SHIELD | Admitting: Obstetrics and Gynecology

## 2017-04-16 DIAGNOSIS — Z130 Encounter for screening for diseases of the blood and blood-forming organs and certain disorders involving the immune mechanism: Secondary | ICD-10-CM

## 2017-04-16 DIAGNOSIS — N393 Stress incontinence (female) (male): Secondary | ICD-10-CM

## 2017-04-16 DIAGNOSIS — K5900 Constipation, unspecified: Secondary | ICD-10-CM

## 2017-04-16 DIAGNOSIS — Z23 Encounter for immunization: Secondary | ICD-10-CM | POA: Diagnosis not present

## 2017-04-16 DIAGNOSIS — Z131 Encounter for screening for diabetes mellitus: Secondary | ICD-10-CM | POA: Diagnosis not present

## 2017-04-16 DIAGNOSIS — O99619 Diseases of the digestive system complicating pregnancy, unspecified trimester: Secondary | ICD-10-CM

## 2017-04-16 DIAGNOSIS — O2240 Hemorrhoids in pregnancy, unspecified trimester: Secondary | ICD-10-CM | POA: Insufficient documentation

## 2017-04-16 HISTORY — DX: Stress incontinence (female) (male): N39.3

## 2017-04-16 MED ORDER — ALBUTEROL SULFATE HFA 108 (90 BASE) MCG/ACT IN AERS: 1.0000 | INHALATION_SPRAY | Freq: Four times a day (QID) | RESPIRATORY_TRACT | 2 refills | Status: DC | PRN

## 2017-04-16 MED ORDER — TETANUS-DIPHTH-ACELL PERTUSSIS 5-2.5-18.5 LF-MCG/0.5 IM SUSP
0.5000 mL | Freq: Once | INTRAMUSCULAR | Status: AC
  Administered 2017-04-16: 0.5 mL via INTRAMUSCULAR

## 2017-04-16 MED ORDER — MONTELUKAST SODIUM 10 MG PO TABS: 10.0000 mg | ORAL_TABLET | Freq: Every day | ORAL | 6 refills | Status: DC

## 2017-04-16 NOTE — Progress Notes (Signed)
ROB-doing well, Tdap and flu vaccine given, discussed breastfeeding and circumcision.

## 2017-04-16 NOTE — Patient Instructions (Signed)
Constipation, Adult Constipation is when a person has fewer bowel movements in a week than normal, has difficulty having a bowel movement, or has stools that are dry, hard, or larger than normal. Constipation may be caused by an underlying condition. It may become worse with age if a person takes certain medicines and does not take in enough fluids. Follow these instructions at home: Eating and drinking   Eat foods that have a lot of fiber, such as fresh fruits and vegetables, whole grains, and beans.  Limit foods that are high in fat, low in fiber, or overly processed, such as french fries, hamburgers, cookies, candies, and soda.  Drink enough fluid to keep your urine clear or pale yellow. General instructions  Exercise regularly or as told by your health care provider.  Go to the restroom when you have the urge to go. Do not hold it in.  Take over-the-counter and prescription medicines only as told by your health care provider. These include any fiber supplements.  Practice pelvic floor retraining exercises, such as deep breathing while relaxing the lower abdomen and pelvic floor relaxation during bowel movements.  Watch your condition for any changes.  Keep all follow-up visits as told by your health care provider. This is important. Contact a health care provider if:  You have pain that gets worse.  You have a fever.  You do not have a bowel movement after 4 days.  You vomit.  You are not hungry.  You lose weight.  You are bleeding from the anus.  You have thin, pencil-like stools. Get help right away if:  You have a fever and your symptoms suddenly get worse.  You leak stool or have blood in your stool.  Your abdomen is bloated.  You have severe pain in your abdomen.  You feel dizzy or you faint. This information is not intended to replace advice given to you by your health care provider. Make sure you discuss any questions you have with your health care  provider. Document Released: 03/08/2004 Document Revised: 12/29/2015 Document Reviewed: 11/29/2015 Elsevier Interactive Patient Education  2017 [employer]. Third Trimester of Pregnancy The third trimester is from week 28 through week 40 (months 7 through 9). The third trimester is a time when the unborn baby (fetus) is growing rapidly. At the end of the ninth month, the fetus is about 20 inches in length and weighs 6-10 pounds. Body changes during your third trimester Your body will continue to go through many changes during pregnancy. The changes vary from woman to woman. During the third trimester:  Your weight will continue to increase. You can expect to gain 25-35 pounds (11-16 kg) by the end of the pregnancy.  You may begin to get stretch marks on your hips, abdomen, and breasts.  You may urinate more often because the fetus is moving lower into your pelvis and pressing on your bladder.  You may develop or continue to have heartburn. This is caused by increased hormones that slow down muscles in the digestive tract.  You may develop or continue to have constipation because increased hormones slow digestion and cause the muscles that push waste through your intestines to relax.  You may develop hemorrhoids. These are swollen veins (varicose veins) in the rectum that can itch or be painful.  You may develop swollen, bulging veins (varicose veins) in your legs.  You may have increased body aches in the pelvis, back, or thighs. This is due to weight gain and increased  hormones that are relaxing your joints.  You may have changes in your hair. These can include thickening of your hair, rapid growth, and changes in texture. Some women also have hair loss during or after pregnancy, or hair that feels dry or thin. Your hair will most likely return to normal after your baby is born.  Your breasts will continue to grow and they will continue to become tender. A yellow fluid (colostrum) may  leak from your breasts. This is the first milk you are producing for your baby.  Your belly button may stick out.  You may notice more swelling in your hands, face, or ankles.  You may have increased tingling or numbness in your hands, arms, and legs. The skin on your belly may also feel numb.  You may feel short of breath because of your expanding uterus.  You may have more problems sleeping. This can be caused by the size of your belly, increased need to urinate, and an increase in your body's metabolism.  You may notice the fetus "dropping," or moving lower in your abdomen (lightening).  You may have increased vaginal discharge.  You may notice your joints feel loose and you may have pain around your pelvic bone.  What to expect at prenatal visits You will have prenatal exams every 2 weeks until week 36. Then you will have weekly prenatal exams. During a routine prenatal visit:  You will be weighed to make sure you and the baby are growing normally.  Your blood pressure will be taken.  Your abdomen will be measured to track your baby's growth.  The fetal heartbeat will be listened to.  Any test results from the previous visit will be discussed.  You may have a cervical check near your due date to see if your cervix has softened or thinned (effaced).  You will be tested for Group B streptococcus. This happens between 35 and 37 weeks.  Your health care provider may ask you:  What your birth plan is.  How you are feeling.  If you are feeling the baby move.  If you have had any abnormal symptoms, such as leaking fluid, bleeding, severe headaches, or abdominal cramping.  If you are using any tobacco products, including cigarettes, chewing tobacco, and electronic cigarettes.  If you have any questions.  Other tests or screenings that may be performed during your third trimester include:  Blood tests that check for low iron levels (anemia).  Fetal testing to check the  health, activity level, and growth of the fetus. Testing is done if you have certain medical conditions or if there are problems during the pregnancy.  Nonstress test (NST). This test checks the health of your baby to make sure there are no signs of problems, such as the baby not getting enough oxygen. During this test, a belt is placed around your belly. The baby is made to move, and its heart rate is monitored during movement.  What is false labor? False labor is a condition in which you feel small, irregular tightenings of the muscles in the womb (contractions) that usually go away with rest, changing position, or drinking water. These are called Braxton Hicks contractions. Contractions may last for hours, days, or even weeks before true labor sets in. If contractions come at regular intervals, become more frequent, increase in intensity, or become painful, you should see your health care provider. What are the signs of labor?  Abdominal cramps.  Regular contractions that start at 10 minutes  apart and become stronger and more frequent with time.  Contractions that start on the top of the uterus and spread down to the lower abdomen and back.  Increased pelvic pressure and dull back pain.  A watery or bloody mucus discharge that comes from the vagina.  Leaking of amniotic fluid. This is also known as your "water breaking." It could be a slow trickle or a gush. Let your health care provider know if it has a color or strange odor. If you have any of these signs, call your health care provider right away, even if it is before your due date. Follow these instructions at home: Medicines  Follow your health care provider's instructions regarding medicine use. Specific medicines may be either safe or unsafe to take during pregnancy.  Take a prenatal vitamin that contains at least 600 micrograms (mcg) of folic acid.  If you develop constipation, try taking a stool softener if your health care  provider approves. Eating and drinking  Eat a balanced diet that includes fresh fruits and vegetables, whole grains, good sources of protein such as meat, eggs, or tofu, and low-fat dairy. Your health care provider will help you determine the amount of weight gain that is right for you.  Avoid raw meat and uncooked cheese. These carry germs that can cause birth defects in the baby.  If you have low calcium intake from food, talk to your health care provider about whether you should take a daily calcium supplement.  Eat four or five small meals rather than three large meals a day.  Limit foods that are high in fat and processed sugars, such as fried and sweet foods.  To prevent constipation: ? Drink enough fluid to keep your urine clear or pale yellow. ? Eat foods that are high in fiber, such as fresh fruits and vegetables, whole grains, and beans. Activity  Exercise only as directed by your health care provider. Most women can continue their usual exercise routine during pregnancy. Try to exercise for 30 minutes at least 5 days a week. Stop exercising if you experience uterine contractions.  Avoid heavy lifting.  Do not exercise in extreme heat or humidity, or at high altitudes.  Wear low-heel, comfortable shoes.  Practice good posture.  You may continue to have sex unless your health care provider tells you otherwise. Relieving pain and discomfort  Take frequent breaks and rest with your legs elevated if you have leg cramps or low back pain.  Take warm sitz baths to soothe any pain or discomfort caused by hemorrhoids. Use hemorrhoid cream if your health care provider approves.  Wear a good support bra to prevent discomfort from breast tenderness.  If you develop varicose veins: ? Wear support pantyhose or compression stockings as told by your healthcare provider. ? Elevate your feet for 15 minutes, 3-4 times a day. Prenatal care  Write down your questions. Take them to your  prenatal visits.  Keep all your prenatal visits as told by your health care provider. This is important. Safety  Wear your seat belt at all times when driving.  Make a list of emergency phone numbers, including numbers for family, friends, the hospital, and police and fire departments. General instructions  Avoid cat litter boxes and soil used by cats. These carry germs that can cause birth defects in the baby. If you have a cat, ask someone to clean the litter box for you.  Do not travel far distances unless it is absolutely necessary and only  with the approval of your health care provider.  Do not use hot tubs, steam rooms, or saunas.  Do not drink alcohol.  Do not use any products that contain nicotine or tobacco, such as cigarettes and e-cigarettes. If you need help quitting, ask your health care provider.  Do not use any medicinal herbs or unprescribed drugs. These chemicals affect the formation and growth of the baby.  Do not douche or use tampons or scented sanitary pads.  Do not cross your legs for long periods of time.  To prepare for the arrival of your baby: ? Take prenatal classes to understand, practice, and ask questions about labor and delivery. ? Make a trial run to the hospital. ? Visit the hospital and tour the maternity area. ? Arrange for maternity or paternity leave through employers. ? Arrange for family and friends to take care of pets while you are in the hospital. ? Purchase a rear-facing car seat and make sure you know how to install it in your car. ? Pack your hospital bag. ? Prepare the baby's nursery. Make sure to remove all pillows and stuffed animals from the baby's crib to prevent suffocation.  Visit your dentist if you have not gone during your pregnancy. Use a soft toothbrush to brush your teeth and be gentle when you floss. Contact a health care provider if:  You are unsure if you are in labor or if your water has broken.  You become  dizzy.  You have mild pelvic cramps, pelvic pressure, or nagging pain in your abdominal area.  You have lower back pain.  You have persistent nausea, vomiting, or diarrhea.  You have an unusual or bad smelling vaginal discharge.  You have pain when you urinate. Get help right away if:  Your water breaks before 37 weeks.  You have regular contractions less than 5 minutes apart before 37 weeks.  You have a fever.  You are leaking fluid from your vagina.  You have spotting or bleeding from your vagina.  You have severe abdominal pain or cramping.  You have rapid weight loss or weight gain.  You have shortness of breath with chest pain.  You notice sudden or extreme swelling of your face, hands, ankles, feet, or legs.  Your baby makes fewer than 10 movements in 2 hours.  You have severe headaches that do not go away when you take medicine.  You have vision changes. Summary  The third trimester is from week 28 through week 40, months 7 through 9. The third trimester is a time when the unborn baby (fetus) is growing rapidly.  During the third trimester, your discomfort may increase as you and your baby continue to gain weight. You may have abdominal, leg, and back pain, sleeping problems, and an increased need to urinate.  During the third trimester your breasts will keep growing and they will continue to become tender. A yellow fluid (colostrum) may leak from your breasts. This is the first milk you are producing for your baby.  False labor is a condition in which you feel small, irregular tightenings of the muscles in the womb (contractions) that eventually go away. These are called Braxton Hicks contractions. Contractions may last for hours, days, or even weeks before true labor sets in.  Signs of labor can include: abdominal cramps; regular contractions that start at 10 minutes apart and become stronger and more frequent with time; watery or bloody mucus discharge that  comes from the vagina; increased pelvic pressure  and dull back pain; and leaking of amniotic fluid. This information is not intended to replace advice given to you by your health care provider. Make sure you discuss any questions you have with your health care provider. Document Released: 06/04/2001 Document Revised: 11/16/2015 Document Reviewed: 08/11/2012 Elsevier Interactive Patient Education  2017 [employer].

## 2017-04-17 ENCOUNTER — Encounter: Payer: Self-pay | Admitting: Certified Nurse Midwife

## 2017-04-17 ENCOUNTER — Other Ambulatory Visit: Payer: Self-pay | Admitting: Certified Nurse Midwife

## 2017-04-17 DIAGNOSIS — O99810 Abnormal glucose complicating pregnancy: Secondary | ICD-10-CM

## 2017-04-17 LAB — CBC WITH DIFFERENTIAL/PLATELET
BASOS ABS: 0 10*3/uL (ref 0.0–0.2)
Basos: 0 %
EOS (ABSOLUTE): 0.1 10*3/uL (ref 0.0–0.4)
Eos: 1 %
HEMOGLOBIN: 11.8 g/dL (ref 11.1–15.9)
Hematocrit: 37.2 % (ref 34.0–46.6)
Immature Grans (Abs): 0.1 10*3/uL (ref 0.0–0.1)
Immature Granulocytes: 1 %
LYMPHS ABS: 1.6 10*3/uL (ref 0.7–3.1)
Lymphs: 18 %
MCH: 27.9 pg (ref 26.6–33.0)
MCHC: 31.7 g/dL (ref 31.5–35.7)
MCV: 88 fL (ref 79–97)
MONOCYTES: 6 %
Monocytes Absolute: 0.5 10*3/uL (ref 0.1–0.9)
Neutrophils Absolute: 6.4 10*3/uL (ref 1.4–7.0)
Neutrophils: 74 %
Platelets: 294 10*3/uL (ref 150–379)
RBC: 4.23 x10E6/uL (ref 3.77–5.28)
RDW: 14.1 % (ref 12.3–15.4)
WBC: 8.7 10*3/uL (ref 3.4–10.8)

## 2017-04-17 LAB — GLUCOSE, 1 HOUR GESTATIONAL: GESTATIONAL DIABETES SCREEN: 163 mg/dL — AB (ref 65–139)

## 2017-04-17 NOTE — Progress Notes (Signed)
!   Hr GTT elevated. Needs 3 hr test. Pt notified via my chart message.   Doreene BurkeAnnie Greyson Peavy, CNM

## 2017-04-23 ENCOUNTER — Other Ambulatory Visit: Payer: BLUE CROSS/BLUE SHIELD

## 2017-04-23 DIAGNOSIS — Z3403 Encounter for supervision of normal first pregnancy, third trimester: Secondary | ICD-10-CM | POA: Diagnosis not present

## 2017-04-24 ENCOUNTER — Other Ambulatory Visit: Payer: Self-pay | Admitting: Endocrinology

## 2017-04-24 LAB — GESTATIONAL GLUCOSE TOLERANCE
GLUCOSE 2 HOUR GTT: 213 mg/dL — AB (ref 65–154)
Glucose, Fasting: 89 mg/dL (ref 65–94)
Glucose, GTT - 1 Hour: 230 mg/dL — ABNORMAL HIGH (ref 65–179)
Glucose, GTT - 3 Hour: 130 mg/dL (ref 65–139)

## 2017-04-25 ENCOUNTER — Encounter: Payer: Self-pay | Admitting: Certified Nurse Midwife

## 2017-04-25 ENCOUNTER — Encounter: Payer: Self-pay | Admitting: Obstetrics and Gynecology

## 2017-04-25 ENCOUNTER — Other Ambulatory Visit: Payer: Self-pay | Admitting: Certified Nurse Midwife

## 2017-04-25 DIAGNOSIS — O24419 Gestational diabetes mellitus in pregnancy, unspecified control: Secondary | ICD-10-CM

## 2017-04-29 ENCOUNTER — Encounter: Payer: Self-pay | Admitting: Dietician

## 2017-04-29 ENCOUNTER — Encounter: Payer: BLUE CROSS/BLUE SHIELD | Attending: Certified Nurse Midwife | Admitting: Dietician

## 2017-04-29 VITALS — BP 116/82 | Ht 65.0 in | Wt 215.3 lb

## 2017-04-29 DIAGNOSIS — Z713 Dietary counseling and surveillance: Secondary | ICD-10-CM | POA: Insufficient documentation

## 2017-04-29 DIAGNOSIS — Z3A00 Weeks of gestation of pregnancy not specified: Secondary | ICD-10-CM | POA: Insufficient documentation

## 2017-04-29 DIAGNOSIS — O24419 Gestational diabetes mellitus in pregnancy, unspecified control: Secondary | ICD-10-CM | POA: Diagnosis not present

## 2017-04-29 DIAGNOSIS — O24410 Gestational diabetes mellitus in pregnancy, diet controlled: Secondary | ICD-10-CM

## 2017-04-29 NOTE — Patient Instructions (Signed)
Read booklet on Gestational Diabetes Follow Gestational Meal Planning Guidelines Complete a 3 Day Food Record and bring to next appointment Check blood sugars 4 x day - before breakfast and 2 hrs after every meal and record  Bring blood sugar log to all appointments Call MD for prescription for meter strips and lancets Strips: Contour Next test strips  Lancets:  Microlet lancets Walk 20-30 minutes at least 5 x week if permitted by MD Next appointment:  05-05-17

## 2017-04-29 NOTE — Progress Notes (Signed)
Appt. Start Time: 1030 Appt. End Time: 1200  GDM Class 1 Diabetes Overview - define DM; state own type of DM; identify functions of pancreas and insulin; define insulin deficiency vs insulin resistance  Psychosocial - identify DM as a source of stress; state the effects of stress on BG control; verbalize appropriate stress management techniques; identify personal stress issues   Nutritional Management - describe effects of food on blood glucose; identify sources of carbohydrate, protein and fat; verbalize the importance of balance meals in controlling blood glucose; identify meals as well balanced or not; estimate servings of carbohydrate from menus; use food labels to identify servings size, content of carbohydrate, fiber, protein, fat, saturated fat and sodium; recognize food sources of fat, saturated fat, trans fat, sodium and verbalize goals for intake; describe healthful appropriate food choices when dining out   Exercise - describe the effects of exercise on blood glucose and importance of regular exercise in controlling diabetes; state a plan for personal exercise; verbalize contraindications for exercise  Medications - state name, dose, timing of currently prescribed medications; describe types of medications available for diabetes  Insulin Training - prepare and administer insulin accurately; state correct rotation pattern; verbalize safe and lawful needle disposal  Self-Monitoring - state importance of HBGM and demo procedure accurately; use HBGM results to effectively manage diabetes; identify importance of regular HbA1C testing and goals for results  Acute Complications/Sick Day Guidelines - recognize hyperglycemia and hypoglycemia with causes and effects; identify blood glucose results as high, low or in control; list steps in treating and preventing high and low blood glucose; state appropriate measure to manage blood glucose when ill (need for meds, HBGM plan, when to call physician,  need for fluids)  Chronic Complications/Foot, Skin, Eye Dental Care - identify possible long-term complications of diabetes (retinopathy, neuropathy, nephropathy, cardiovascular disease, infections); explain steps in prevention and treatment of chronic complications; state importance of daily self-foot exams; describe how to examine feet and what to look for; explain appropriate eye and dental care  Lifestyle Changes/Goals & Health/Community Resources - state benefits of making appropriate lifestyle changes; identify habits that need to change (meals, tobacco, alcohol); identify strategies to reduce risk factors for personal health; set goals for proper diabetes care; state need for and frequency of healthcare follow-up; describe appropriate community resources for good health (ADA, web sites, apps)   Pregnancy/Sexual Health - define gestational diabetes; state importance of good blood glucose control and birth control prior to pregnancy; state importance of good blood glucose control in preventing sexual problems (impotence, vaginal dryness, infections, loss of desire); state relationship of blood glucose control and pregnancy outcome; describe risk of maternal and fetal complications  Teaching Materials Used: Meter-Contour Next meter General Meal Planning Guidelines Daily Food Record Gestational Diabetes Booklet Gestational Video Goals for Healthy Pregnancy

## 2017-04-30 ENCOUNTER — Telehealth: Payer: Self-pay | Admitting: Obstetrics and Gynecology

## 2017-04-30 ENCOUNTER — Other Ambulatory Visit: Payer: Self-pay | Admitting: *Deleted

## 2017-04-30 ENCOUNTER — Encounter: Payer: Self-pay | Admitting: Obstetrics and Gynecology

## 2017-04-30 MED ORDER — GLUCOSE BLOOD VI STRP: ORAL_STRIP | 12 refills | Status: DC

## 2017-04-30 MED ORDER — MICROLET LANCETS MISC: 1.0000 | Freq: Four times a day (QID) | 2 refills | Status: DC

## 2017-04-30 NOTE — Telephone Encounter (Signed)
Patient has been diagnosed with diabetes and the patient is calling wanting to have a nurse Amy or Melody give her a call back for a prescription of "test strips" or another medication that she will have to take 4 times a day. The patient did not disclose any other information other than wanting to receive a call back as soon as possible to discuss her questions, concerns and what to do moving forward. Please advise.

## 2017-05-01 ENCOUNTER — Ambulatory Visit (INDEPENDENT_AMBULATORY_CARE_PROVIDER_SITE_OTHER): Payer: BLUE CROSS/BLUE SHIELD | Admitting: Certified Nurse Midwife

## 2017-05-01 ENCOUNTER — Encounter: Payer: Self-pay | Admitting: Certified Nurse Midwife

## 2017-05-01 ENCOUNTER — Other Ambulatory Visit: Payer: Self-pay

## 2017-05-01 VITALS — BP 123/75 | HR 90 | Wt 215.9 lb

## 2017-05-01 DIAGNOSIS — Z3483 Encounter for supervision of other normal pregnancy, third trimester: Secondary | ICD-10-CM

## 2017-05-01 DIAGNOSIS — R319 Hematuria, unspecified: Secondary | ICD-10-CM | POA: Diagnosis not present

## 2017-05-01 DIAGNOSIS — O24410 Gestational diabetes mellitus in pregnancy, diet controlled: Secondary | ICD-10-CM

## 2017-05-01 LAB — POCT URINALYSIS DIPSTICK
Bilirubin, UA: NEGATIVE
Glucose, UA: NEGATIVE
Leukocytes, UA: NEGATIVE
NITRITE UA: NEGATIVE
PH UA: 6 (ref 5.0–8.0)
PROTEIN UA: NEGATIVE
Spec Grav, UA: 1.01 (ref 1.010–1.025)
UROBILINOGEN UA: 0.2 U/dL

## 2017-05-01 NOTE — Patient Instructions (Signed)

## 2017-05-02 NOTE — Progress Notes (Signed)
ROB-Pt doing well. Lifestyles visit on Tuesday, 11/06. Follow up scheduled Monday, 11/12.Fasting <95 and PP<120, praise given. Questions answered and reassurance offered. Discussed growth US at 36 weeks, pt verbalized understanding. Next endocrinology appt and TSH on 11/28. Reviewed red flag symptoms and when to call. RTC x 2 weeks for ROB with Pattricia BossAnnie or sooner if needed.

## 2017-05-03 LAB — CULTURE, OB URINE

## 2017-05-03 LAB — URINE CULTURE, OB REFLEX

## 2017-05-05 ENCOUNTER — Encounter: Payer: Self-pay | Admitting: Dietician

## 2017-05-05 ENCOUNTER — Encounter: Payer: BLUE CROSS/BLUE SHIELD | Admitting: Dietician

## 2017-05-05 VITALS — BP 112/70 | Ht 65.0 in | Wt 216.8 lb

## 2017-05-05 DIAGNOSIS — Z713 Dietary counseling and surveillance: Secondary | ICD-10-CM | POA: Diagnosis not present

## 2017-05-05 DIAGNOSIS — Z3A00 Weeks of gestation of pregnancy not specified: Secondary | ICD-10-CM | POA: Diagnosis not present

## 2017-05-05 DIAGNOSIS — O24410 Gestational diabetes mellitus in pregnancy, diet controlled: Secondary | ICD-10-CM

## 2017-05-05 DIAGNOSIS — O24419 Gestational diabetes mellitus in pregnancy, unspecified control: Secondary | ICD-10-CM | POA: Diagnosis not present

## 2017-05-05 NOTE — Progress Notes (Signed)
   Patient's BG record indicates BGs are within goal ranges  Patient's food diary indicates healthy food choices, controlled carbohydrate intake, adequate protein.    Provided 1800kcal meal plan, and wrote individualized menus based on patient's food preferences.  Instructed patient on food safety, including avoidance of Listeriosis, and limiting mercury from fish.  Discussed importance of maintaining healthy lifestyle habits to reduce risk of Type 2 DM as well as Gestational DM with any future pregnancies.  Advised patient to use any remaining testing supplies to test some BGs after delivery, and to have BG tested ideally annually, as well as prior to attempting future pregnancies.

## 2017-05-05 NOTE — Patient Instructions (Signed)
   Keep up your healthy food choices and regular exercise.   If blood sugar drops below 70, drink 4oz juice or regular soda. Wait 10-15 minutes for blood sugar to increase back to normal and symptoms to subside. Then eat a meal or snack with protein within 30 minutes.

## 2017-05-06 ENCOUNTER — Encounter: Payer: Self-pay | Admitting: Certified Nurse Midwife

## 2017-05-18 ENCOUNTER — Other Ambulatory Visit: Payer: Self-pay | Admitting: Endocrinology

## 2017-05-19 ENCOUNTER — Encounter: Payer: Self-pay | Admitting: Certified Nurse Midwife

## 2017-05-20 ENCOUNTER — Other Ambulatory Visit: Payer: Self-pay | Admitting: Certified Nurse Midwife

## 2017-05-20 ENCOUNTER — Other Ambulatory Visit: Payer: Self-pay

## 2017-05-20 ENCOUNTER — Encounter: Payer: Self-pay | Admitting: Certified Nurse Midwife

## 2017-05-20 ENCOUNTER — Ambulatory Visit (INDEPENDENT_AMBULATORY_CARE_PROVIDER_SITE_OTHER): Payer: BLUE CROSS/BLUE SHIELD | Admitting: Certified Nurse Midwife

## 2017-05-20 VITALS — BP 105/69 | HR 85 | Wt 213.7 lb

## 2017-05-20 DIAGNOSIS — O24410 Gestational diabetes mellitus in pregnancy, diet controlled: Secondary | ICD-10-CM

## 2017-05-20 DIAGNOSIS — Z3483 Encounter for supervision of other normal pregnancy, third trimester: Secondary | ICD-10-CM

## 2017-05-20 DIAGNOSIS — Z369 Encounter for antenatal screening, unspecified: Secondary | ICD-10-CM

## 2017-05-20 LAB — POCT URINALYSIS DIPSTICK
Bilirubin, UA: NEGATIVE
Glucose, UA: NEGATIVE
Leukocytes, UA: NEGATIVE
Nitrite, UA: NEGATIVE
PH UA: 6 (ref 5.0–8.0)
Protein, UA: NEGATIVE
SPEC GRAV UA: 1.01 (ref 1.010–1.025)
UROBILINOGEN UA: 0.2 U/dL

## 2017-05-20 NOTE — Patient Instructions (Signed)

## 2017-05-20 NOTE — Progress Notes (Signed)
ROB-Pt doing well. Blood sugar log sent via MyChart and reviewed during visit. All fasting <95 and 2 PP >120, but all others normal. Ketones present in urine. Encouraged adequate hydration. Rx: Breast pump completed and faxed today. Endocrinology appointment tomorrow. Reviewed red flag symptoms and when to call. RTC x 2-3 weeks 36 week cultures, growth US/BPP and ROB with Melody or sooner if needed.

## 2017-05-21 ENCOUNTER — Encounter: Payer: Self-pay | Admitting: Endocrinology

## 2017-05-21 ENCOUNTER — Ambulatory Visit (INDEPENDENT_AMBULATORY_CARE_PROVIDER_SITE_OTHER): Payer: BLUE CROSS/BLUE SHIELD | Admitting: Endocrinology

## 2017-05-21 VITALS — BP 112/72 | HR 98 | Wt 213.8 lb

## 2017-05-21 DIAGNOSIS — E061 Subacute thyroiditis: Secondary | ICD-10-CM | POA: Diagnosis not present

## 2017-05-21 NOTE — Progress Notes (Signed)
Subjective:    Patient ID: Diane [name], female    DOB: 1985-10-11, 31 y.o.   MRN: [id number]  HPI Pt returns for f/u of subacute thyroiditis (dx'ed mid-2016, when she presented with hyprethyroidism; she has never had thyroid imaging; she had a child in early 2016; she was rx'ed synthroid in late 2016).  pt states she feels well in general.  She is now at [redacted] weeks gestation.  She has gained 5 lbs so far. She is on diet rx for GDM.   Past Medical History:  Diagnosis Date  . Endometriosis   . Hyperthyroidism   . Ovarian cyst   . Thyroiditis     Past Surgical History:  Procedure Laterality Date  . LAPAROSCOPY  2009    Social History   Socioeconomic History  . Marital status: Married    Spouse name: Not on file  . Number of children: Not on file  . Years of education: Not on file  . Highest education level: Not on file  Social Needs  . Financial resource strain: Not on file  . Food insecurity - worry: Not on file  . Food insecurity - inability: Not on file  . Transportation needs - medical: Not on file  . Transportation needs - non-medical: Not on file  Occupational History  . Not on file  Tobacco Use  . Smoking status: Never Smoker  . Smokeless tobacco: Never Used  Substance and Sexual Activity  . Alcohol use: No  . Drug use: No  . Sexual activity: Yes    Birth control/protection: None  Other Topics Concern  . Not on file  Social History Narrative  . Not on file    Current Outpatient Medications on File Prior to Visit  Medication Sig Dispense Refill  . albuterol (PROAIR HFA) 108 (90 Base) MCG/ACT inhaler Inhale 2 puffs into the lungs every 6 (six) hours as needed for wheezing or shortness of breath. 1 Inhaler 2  . glucose blood test strip Use as instructed 100 each 12  . levothyroxine (SYNTHROID, LEVOTHROID) 112 MCG tablet TAKE 1 TABLET(112 MCG) BY MOUTH DAILY BEFORE BREAKFAST 30 tablet 0  . MAGNESIUM-OXIDE 400 (241.3 Mg) MG tablet Take 1 tablet daily by  mouth.  2  . MICROLET LANCETS MISC 1 applicator 4 (four) times daily by Does not apply route. 100 each 2  . montelukast (SINGULAIR) 10 MG tablet Take 1 tablet (10 mg total) by mouth at bedtime. 30 tablet 6  . Prenatal Vit-Fe Fumarate-FA (PRENATAL MULTIVITAMIN) TABS tablet Take 1 tablet by mouth daily at 12 noon.     No current facility-administered medications on file prior to visit.     No Known Allergies  Family History  Problem Relation Age of Onset  . Diabetes Father   . Cancer Maternal Grandmother        colon, breast  . Heart disease Maternal Grandmother   . Heart disease Paternal Grandfather   . Thyroid disease Neg Hx     BP 112/72 (BP Location: Left Arm, Patient Position: Sitting, Cuff Size: Normal)   Pulse 98   Wt 213 lb 12.8 oz (97 kg)   LMP 09/17/2016   SpO2 94%   BMI 35.58 kg/m    Review of Systems Denies leg edema    Objective:   Physical Exam VITAL SIGNS:  See vs page GENERAL: no distress NECK: There is no palpable thyroid enlargement.  No thyroid nodule is palpable.  No palpable lymphadenopathy at the anterior neck.  Assessment & Plan:  Thyroiditis: she has developed chronic hypothyroidism.  Due fore recheck  Patient Instructions  blood tests are requested for you today.  We'll let you know about the results. Please come back for a follow-up appointment in 3 months.

## 2017-05-21 NOTE — Patient Instructions (Signed)
blood tests are requested for you today.  We'll let you know about the results.   Please come back for a follow-up appointment in 3 months.   

## 2017-05-22 LAB — TSH: TSH: 0.26 u[IU]/mL — AB (ref 0.35–4.50)

## 2017-05-22 LAB — T4, FREE: Free T4: 0.79 ng/dL (ref 0.60–1.60)

## 2017-05-31 ENCOUNTER — Encounter: Payer: Self-pay | Admitting: Obstetrics and Gynecology

## 2017-06-02 ENCOUNTER — Encounter: Payer: Self-pay | Admitting: Obstetrics and Gynecology

## 2017-06-03 ENCOUNTER — Encounter: Payer: BLUE CROSS/BLUE SHIELD | Admitting: Obstetrics and Gynecology

## 2017-06-03 ENCOUNTER — Ambulatory Visit (INDEPENDENT_AMBULATORY_CARE_PROVIDER_SITE_OTHER): Payer: BLUE CROSS/BLUE SHIELD

## 2017-06-03 ENCOUNTER — Ambulatory Visit (INDEPENDENT_AMBULATORY_CARE_PROVIDER_SITE_OTHER): Payer: BLUE CROSS/BLUE SHIELD | Admitting: Certified Nurse Midwife

## 2017-06-03 ENCOUNTER — Other Ambulatory Visit: Payer: BLUE CROSS/BLUE SHIELD

## 2017-06-03 VITALS — BP 124/75 | HR 81 | Wt 211.5 lb

## 2017-06-03 DIAGNOSIS — Z369 Encounter for antenatal screening, unspecified: Secondary | ICD-10-CM

## 2017-06-03 DIAGNOSIS — Z3685 Encounter for antenatal screening for Streptococcus B: Secondary | ICD-10-CM | POA: Diagnosis not present

## 2017-06-03 DIAGNOSIS — Z3493 Encounter for supervision of normal pregnancy, unspecified, third trimester: Secondary | ICD-10-CM

## 2017-06-03 DIAGNOSIS — Z113 Encounter for screening for infections with a predominantly sexual mode of transmission: Secondary | ICD-10-CM | POA: Diagnosis not present

## 2017-06-03 LAB — POCT URINALYSIS DIPSTICK
Bilirubin, UA: NEGATIVE
Blood, UA: NEGATIVE
GLUCOSE UA: NEGATIVE
Ketones, UA: NEGATIVE
LEUKOCYTES UA: NEGATIVE
Nitrite, UA: NEGATIVE
PROTEIN UA: NEGATIVE
SPEC GRAV UA: 1.015 (ref 1.010–1.025)
Urobilinogen, UA: 0.2 E.U./dL
pH, UA: 6.5 (ref 5.0–8.0)

## 2017-06-03 NOTE — Progress Notes (Signed)
ROB- pt had growth scan done today, pt is doing well

## 2017-06-03 NOTE — Progress Notes (Signed)
ROB- growth scan & cultures done today.  Indications: BPP & Growth Findings:  Singleton intrauterine pregnancy is visualized with FHR at 123 BPM. Biometrics give an (U/S) Gestational age of 32 6/7 weeks and an (U/S) EDD of 07/02/17; this correlates with the clinically established EDD of 07/03/17.  Fetal presentation is vertex.  EFW: 2752 grams (6lb 1oz).  46th percentile. Placenta: Posterior and grade 2. AFI: 14.1 cm.  BPP: 8/8 with good visualization of fetal movement, tone, breathing, and fluid.  Impression: 1. 35 6/7 week Viable Singleton Intrauterine pregnancy by U/S. 2. (U/S) EDD is consistent with Clinically established (LMP) EDD of 07/03/17. 3. EFW: 2752 grams (6lb 1oz).  46th percentile. 4. BPP: 8/8   Reviewed blood sugars, OK to drop to two sugar checks a day.

## 2017-06-05 ENCOUNTER — Other Ambulatory Visit: Payer: BLUE CROSS/BLUE SHIELD

## 2017-06-05 LAB — GC/CHLAMYDIA PROBE AMP
CHLAMYDIA, DNA PROBE: NEGATIVE
Neisseria gonorrhoeae by PCR: NEGATIVE

## 2017-06-05 LAB — STREP GP B NAA: STREP GROUP B AG: NEGATIVE

## 2017-06-10 ENCOUNTER — Encounter: Payer: Self-pay | Admitting: Obstetrics and Gynecology

## 2017-06-11 ENCOUNTER — Encounter: Payer: Self-pay | Admitting: Obstetrics and Gynecology

## 2017-06-12 ENCOUNTER — Ambulatory Visit (INDEPENDENT_AMBULATORY_CARE_PROVIDER_SITE_OTHER): Payer: BLUE CROSS/BLUE SHIELD | Admitting: Obstetrics and Gynecology

## 2017-06-12 VITALS — BP 102/78 | HR 84 | Wt 206.8 lb

## 2017-06-12 DIAGNOSIS — Z3493 Encounter for supervision of normal pregnancy, unspecified, third trimester: Secondary | ICD-10-CM

## 2017-06-12 LAB — POCT URINALYSIS DIPSTICK
Bilirubin, UA: NEGATIVE
Blood, UA: NEGATIVE
Glucose, UA: NEGATIVE
KETONES UA: NEGATIVE
LEUKOCYTES UA: NEGATIVE
Nitrite, UA: NEGATIVE
PH UA: 6.5 (ref 5.0–8.0)
Protein, UA: NEGATIVE
SPEC GRAV UA: 1.015 (ref 1.010–1.025)
UROBILINOGEN UA: 0.2 U/dL

## 2017-06-12 NOTE — Progress Notes (Signed)
ROB-labor precautions discussed, negative cultures. BS stable.

## 2017-06-12 NOTE — Progress Notes (Signed)
ROB- pt is having some braxton hicks, pelvic pressure

## 2017-06-18 ENCOUNTER — Inpatient Hospital Stay
Admission: EM | Admit: 2017-06-18 | Discharge: 2017-06-20 | DRG: 807 | Disposition: A | Discharge status: Home / Self Care | Payer: BLUE CROSS/BLUE SHIELD | Attending: Certified Nurse Midwife | Admitting: Certified Nurse Midwife

## 2017-06-18 ENCOUNTER — Other Ambulatory Visit: Payer: Self-pay

## 2017-06-18 DIAGNOSIS — O24420 Gestational diabetes mellitus in childbirth, diet controlled: Principal | ICD-10-CM | POA: Diagnosis present

## 2017-06-18 DIAGNOSIS — Z3A38 38 weeks gestation of pregnancy: Secondary | ICD-10-CM | POA: Diagnosis not present

## 2017-06-18 DIAGNOSIS — O139 Gestational [pregnancy-induced] hypertension without significant proteinuria, unspecified trimester: Secondary | ICD-10-CM | POA: Diagnosis not present

## 2017-06-18 DIAGNOSIS — O99284 Endocrine, nutritional and metabolic diseases complicating childbirth: Secondary | ICD-10-CM | POA: Diagnosis present

## 2017-06-18 DIAGNOSIS — Z3483 Encounter for supervision of other normal pregnancy, third trimester: Secondary | ICD-10-CM | POA: Diagnosis not present

## 2017-06-18 DIAGNOSIS — O4292 Full-term premature rupture of membranes, unspecified as to length of time between rupture and onset of labor: Secondary | ICD-10-CM | POA: Diagnosis not present

## 2017-06-18 DIAGNOSIS — E0590 Thyrotoxicosis, unspecified without thyrotoxic crisis or storm: Secondary | ICD-10-CM | POA: Diagnosis not present

## 2017-06-18 DIAGNOSIS — Z3A37 37 weeks gestation of pregnancy: Secondary | ICD-10-CM

## 2017-06-18 DIAGNOSIS — Z23 Encounter for immunization: Secondary | ICD-10-CM | POA: Diagnosis not present

## 2017-06-18 DIAGNOSIS — Z3480 Encounter for supervision of other normal pregnancy, unspecified trimester: Secondary | ICD-10-CM | POA: Diagnosis not present

## 2017-06-18 HISTORY — DX: Hypothyroidism, unspecified: E03.9

## 2017-06-18 HISTORY — DX: Gestational diabetes mellitus in pregnancy, unspecified control: O24.419

## 2017-06-18 LAB — CBC
HCT: 36.8 % (ref 35.0–47.0)
HEMOGLOBIN: 12.7 g/dL (ref 12.0–16.0)
MCH: 29.2 pg (ref 26.0–34.0)
MCHC: 34.5 g/dL (ref 32.0–36.0)
MCV: 84.5 fL (ref 80.0–100.0)
PLATELETS: 254 10*3/uL (ref 150–440)
RBC: 4.35 MIL/uL (ref 3.80–5.20)
RDW: 14.3 % (ref 11.5–14.5)
WBC: 8 10*3/uL (ref 3.6–11.0)

## 2017-06-18 LAB — GLUCOSE, CAPILLARY
GLUCOSE-CAPILLARY: 113 mg/dL — AB (ref 65–99)
Glucose-Capillary: 88 mg/dL (ref 65–99)

## 2017-06-18 LAB — TYPE AND SCREEN
ABO/RH(D): O POS
Antibody Screen: NEGATIVE

## 2017-06-18 MED ORDER — MISOPROSTOL 200 MCG PO TABS
ORAL_TABLET | ORAL | Status: AC
  Filled 2017-06-18: qty 4

## 2017-06-18 MED ORDER — OXYTOCIN BOLUS FROM INFUSION: 500.0000 mL | Freq: Once | INTRAVENOUS | Status: DC

## 2017-06-18 MED ORDER — ACETAMINOPHEN 325 MG PO TABS: 650.0000 mg | ORAL_TABLET | ORAL | Status: DC | PRN

## 2017-06-18 MED ORDER — SOD CITRATE-CITRIC ACID 500-334 MG/5ML PO SOLN: 30.0000 mL | ORAL | Status: DC | PRN

## 2017-06-18 MED ORDER — BUTORPHANOL TARTRATE 1 MG/ML IJ SOLN: 1.0000 mg | INTRAMUSCULAR | Status: DC | PRN

## 2017-06-18 MED ORDER — OXYTOCIN 10 UNIT/ML IJ SOLN
INTRAMUSCULAR | Status: AC
  Filled 2017-06-18: qty 2

## 2017-06-18 MED ORDER — OXYTOCIN 40 UNITS IN LACTATED RINGERS INFUSION - SIMPLE MED: 2.5000 [IU]/h | INTRAVENOUS | Status: DC

## 2017-06-18 MED ORDER — ONDANSETRON HCL 4 MG/2ML IJ SOLN: 4.0000 mg | Freq: Four times a day (QID) | INTRAMUSCULAR | Status: DC | PRN

## 2017-06-18 MED ORDER — FLEET ENEMA 7-19 GM/118ML RE ENEM: 1.0000 | ENEMA | Freq: Every day | RECTAL | Status: DC | PRN

## 2017-06-18 MED ORDER — TERBUTALINE SULFATE 1 MG/ML IJ SOLN: 0.2500 mg | Freq: Once | INTRAMUSCULAR | Status: DC | PRN

## 2017-06-18 MED ORDER — LACTATED RINGERS IV SOLN: 500.0000 mL | INTRAVENOUS | Status: DC | PRN

## 2017-06-18 MED ORDER — SODIUM CHLORIDE FLUSH 0.9 % IV SOLN
INTRAVENOUS | Status: AC
  Administered 2017-06-18: 10 mL
  Filled 2017-06-18: qty 10

## 2017-06-18 MED ORDER — AMMONIA AROMATIC IN INHA
RESPIRATORY_TRACT | Status: AC
  Filled 2017-06-18: qty 10

## 2017-06-18 MED ORDER — OXYCODONE-ACETAMINOPHEN 5-325 MG PO TABS: 1.0000 | ORAL_TABLET | ORAL | Status: DC | PRN

## 2017-06-18 MED ORDER — LIDOCAINE HCL (PF) 1 % IJ SOLN
30.0000 mL | INTRAMUSCULAR | Status: DC | PRN
  Administered 2017-06-19: 30 mL via SUBCUTANEOUS
  Filled 2017-06-18: qty 30

## 2017-06-18 MED ORDER — OXYTOCIN 40 UNITS IN LACTATED RINGERS INFUSION - SIMPLE MED
1.0000 m[IU]/min | INTRAVENOUS | Status: DC
  Administered 2017-06-18: 2 m[IU]/min via INTRAVENOUS
  Filled 2017-06-18: qty 1000

## 2017-06-18 MED ORDER — LACTATED RINGERS IV SOLN
INTRAVENOUS | Status: DC
  Administered 2017-06-18 – 2017-06-19 (×3): via INTRAVENOUS

## 2017-06-18 MED ORDER — ZOLPIDEM TARTRATE 5 MG PO TABS: 5.0000 mg | ORAL_TABLET | Freq: Every evening | ORAL | Status: DC | PRN

## 2017-06-18 MED ORDER — OXYCODONE-ACETAMINOPHEN 5-325 MG PO TABS: 2.0000 | ORAL_TABLET | ORAL | Status: DC | PRN

## 2017-06-18 MED ORDER — MISOPROSTOL 25 MCG QUARTER TABLET: 25.0000 ug | ORAL_TABLET | ORAL | Status: DC

## 2017-06-18 NOTE — Progress Notes (Signed)
M.Lawhorn, CNM at bedside discussing plan of care with patient. External fetal monitors removed by CNM. Per CNM for patient to be off monitors. Plan is for patient to eat a regular diet and notify RN if she notices contractions.  Patient states she isn't feeling contractions at this time.

## 2017-06-18 NOTE — H&P (Signed)
Obstetric History and Physical  Diane [name] is a 31 y.o. G2P0 with IUP at 6253w6d presenting with spontaneous rupture of membranes at 0520. Patient states she has been having  irregular contractions, none vaginal bleeding, ruptured membranes, with active fetal movement.    Denies difficulty breathing or respiratory distress, chest pain, abdominal pain, dysuria, and leg pain or swelling.   Prenatal Course  Source of Care: [facility]-initial visit at 11+5 wks, total visits: 9  Pregnancy complications or risks: gestational diabetes-diet controlled, hyperthyroidism  Prenatal labs and studies:  ABO, Rh: O/Positive/-- (06/20 1055)  Antibody: Negative (06/20 1055)  Rubella: 1.87 (06/20 1055)   Varicella: 1,536 (06/20 1055)  RPR: Non Reactive (06/20 1055)   HBsAg: Negative (06/20 1055)   HIV: Non Reactive (06/20 1055)  GNF:AOZHYQMVGBS:Negative (12/11 1521)  1 hr Glucola: 163 (10/24 0923)  3 hr Glucose Tolerance Test: 89, 230, 213, 130 (10/31 1424)  Genetic screening: Declined  Anatomy US: Normal (09/25 1302)  Past Medical History:  Diagnosis Date  . Endometriosis   . Hyperthyroidism   . Ovarian cyst   . Thyroiditis     Past Surgical History:  Procedure Laterality Date  . LAPAROSCOPY  2009    OB History  Gravida Para Term Preterm AB Living  2 0       1  SAB TAB Ectopic Multiple Live Births          1    # Outcome Date GA Lbr Len/2nd Weight Sex Delivery Anes PTL Lv  2 Current           1 Gravida 08/11/14    F Vag-Spont   LIV      Social History   Socioeconomic History  . Marital status: Married    Spouse name: None  . Number of children: None  . Years of education: None  . Highest education level: None  Social Needs  . Financial resource strain: None  . Food insecurity - worry: None  . Food insecurity - inability: None  . Transportation needs - medical: None  . Transportation needs - non-medical: None  Occupational History  . None  Tobacco Use  . Smoking status:  Never Smoker  . Smokeless tobacco: Never Used  Substance and Sexual Activity  . Alcohol use: No  . Drug use: No  . Sexual activity: Yes    Birth control/protection: Pill  Other Topics Concern  . None  Social History Narrative  . None    Family History  Problem Relation Age of Onset  . Diabetes Father   . Cancer Maternal Grandmother        colon, breast  . Heart disease Maternal Grandmother   . Heart disease Paternal Grandfather   . Thyroid disease Neg Hx     Medications Prior to Admission  Medication Sig Dispense Refill Last Dose  . docusate sodium (COLACE) 100 MG capsule Take 100 mg by mouth 2 (two) times daily.   06/17/2017 at Unknown time  . levothyroxine (SYNTHROID, LEVOTHROID) 112 MCG tablet TAKE 1 TABLET(112 MCG) BY MOUTH DAILY BEFORE BREAKFAST 30 tablet 0 06/17/2017 at Unknown time  . MAGNESIUM-OXIDE 400 (241.3 Mg) MG tablet Take 1 tablet daily by mouth.  2 06/17/2017 at Unknown time  . montelukast (SINGULAIR) 10 MG tablet Take 1 tablet (10 mg total) by mouth at bedtime. 30 tablet 6 06/17/2017 at Unknown time  . Prenatal Vit-Fe Fumarate-FA (PRENATAL MULTIVITAMIN) TABS tablet Take 1 tablet by mouth daily at 12 noon.   06/17/2017 at  Unknown time  . albuterol (PROAIR HFA) 108 (90 Base) MCG/ACT inhaler Inhale 2 puffs into the lungs every 6 (six) hours as needed for wheezing or shortness of breath. (Patient not taking: Reported on 06/03/2017) 1 Inhaler 2 Not Taking  . glucose blood test strip Use as instructed 100 each 12 Taking  . MICROLET LANCETS MISC 1 applicator 4 (four) times daily by Does not apply route. 100 each 2 Taking    No Known Allergies  Review of Systems: Negative except for what is mentioned in HPI.  Physical Exam:  BP 120/68 (BP Location: Left Arm)   Pulse 67   Temp 98.1 F (36.7 C) (Oral)   Resp 18   Ht 5\' 5"  (1.651 m)   Wt 206 lb (93.4 kg)   LMP 09/17/2016   BMI 34.28 kg/m    GENERAL: Well-developed, well-nourished female in no acute distress.    LUNGS: Clear to auscultation bilaterally.   HEART: Regular rate and rhythm.  ABDOMEN: Soft, nontender, nondistended, gravid.  EXTREMITIES: Nontender, no edema, 2+ distal pulses.  Cervical Exam: Dilation: 1 Effacement (%): Thick Station: -3 Exam by:: M.Newton,RN   FHT:  Baseline rate 130 bpm   Variability moderate  Accelerations present   Decelerations none  Contractions: Every one (1) to six (6) minutes, soft resting tone   Pertinent Labs/Studies:    Results for orders placed or performed during the hospital encounter of 06/18/17 (from the past 24 hour(s))  Glucose, capillary     Status: None   Collection Time: 06/18/17 12:59 PM  Result Value Ref Range   Glucose-Capillary 88 65 - 99 mg/dL    Assessment :  Diane [name] is a 31 y.o. G2P0 at 2728w6d being admitted for term premature rupture of membranes, gestational diabetes-diet controlled, Rh positive, GBS negative  FHR Category I  Plan:  Admit to birth suites, see orders.   Discussed intrapartum management options including expectant management, cervical ripening with cytotec, or pitocin augmentation as well as associated risks and benefits. Pt prefers expectant management at this time.   Encouraged ambulation and position change.   Reviewed red flag symptoms and when to call.   Dr. Valentino Saxonherry aware of patient admission and plan of care.    Gunnar BullaJenkins Michelle Lydiana Milley, CNM [facility], [facility]

## 2017-06-18 NOTE — Progress Notes (Signed)
Patient ID: Diane Nearingracey A Donner, female   DOB: 10/15/1985, 31 y.o.   MRN: [id number]   Diane Nearingracey A Seats is a 31 y.o. G2P1001 at 604w6d by ultrasound admitted for rupture of membranes  Subjective:  Pt alternating between walking in hallway and breast pumping to stimulate contractions. FOB at beside. No questions or concerns at this time. Endorses good fetal movement.   Denies difficulty breathing or respiratory distress, chest pain, abdominal pain, vaginal bleeding, dysuria, and leg pain or swelling.   Objective:  Temp:  [98.1 F (36.7 C)-98.4 F (36.9 C)] 98.2 F (36.8 C) (12/26 1545) Pulse Rate:  [67-89] 89 (12/26 1545) Resp:  [17-18] 17 (12/26 1545) BP: (102-120)/(68-78) 111/70 (12/26 1545) Weight:  [206 lb (93.4 kg)] 206 lb (93.4 kg) (12/26 0818)  FHT:  FHR: 130 bpm, variability: moderate,  accelerations:  Present,  decelerations:  Absent  UC:   Irregular, soft resting tone  SVE:   Dilation: 1 Effacement (%): Thick Station: -3 Exam by:: M.Newton,RN  Labs:  Lab Results  Component Value Date   WBC 8.0 06/18/2017   HGB 12.7 06/18/2017   HCT 36.8 06/18/2017   MCV 84.5 06/18/2017   PLT 254 06/18/2017    Assessment:  Diane [name] is a 31 y.o. G2P0 at 324w6d being admitted for term premature rupture of membranes, gestational diabetes-diet controlled, Rh positive, GBS negative  FHR Category I  Plan:  Encouraged rest and position change.   Reviewed red flag symptoms and when to call.   Continue orders as written. Reassess as needed.    Gunnar BullaJenkins Michelle Raden Byington, CNM [facility], [facility] 06/18/2017, 5:13 PM

## 2017-06-18 NOTE — OB Triage Note (Signed)
Patient presented to L&D with complaints of leaking fluid since 0520 this morning.  Denies vaginal bleeding or decreased fetal movement. States shes not really feeling any contractions.

## 2017-06-18 NOTE — Plan of Care (Signed)
Discussed plan of care with patient. Patient and spouse verbalized understanding and agreed to plan.

## 2017-06-18 NOTE — Progress Notes (Signed)
Boykin Nearingracey A Pinson is a 31 y.o. G2P1001 at 720w6d by ultrasound admitted for rupture of membranes  Subjective:   Pt alternating between walking in hallway and breast pumping to stimulate contractions. FOB at beside. Questions regarding plan of care. Endorses good fetal movement.   Denies difficulty breathing or respiratory distress, chest pain, abdominal pain, vaginal bleeding, dysuria, and leg pain or swelling.   Objective:  Temp:  [98.1 F (36.7 C)-98.4 F (36.9 C)] 98.2 F (36.8 C) (12/26 2110) Pulse Rate:  [67-89] 85 (12/26 2211) Resp:  [17-18] 17 (12/26 2211) BP: (95-120)/(68-78) 114/75 (12/26 2211) Weight:  [206 lb (93.4 kg)] 206 lb (93.4 kg) (12/26 0818)  FHT:  FHR: 135 bpm, variability: moderate,  accelerations:  Present,  decelerations:  Absent  UC:   irregular, every five (5) to six (6) minutes, soft resting tone  SVE:   Dilation: 1.5 Effacement (%): 60 Station: -2 Exam by:: Willodean RosenthalM. Cecilio Ohlrich CNM(K. Cevallos RN)  Labs:  Lab Results  Component Value Date   WBC 8.0 06/18/2017   HGB 12.7 06/18/2017   HCT 36.8 06/18/2017   MCV 84.5 06/18/2017   PLT 254 06/18/2017    Assessment:  Airel A Reynoldsis a 31 y.o.G2P0 at 6720w6d being admitted for term premature rupture of membranes, gestational diabetes-diet controlled, Rh positive, GBS negative  FHR Category I  Plan:  Plan of care discussed with pt and family. Induction/Augmentation options reviewed as well as associated risks and benefits. Pt desired pitocin augmentation at this time.   Encourage position change and rest.   May have epidural when desired.   Reviewed red flag symptoms and when to call.    Gunnar BullaJenkins Michelle Leen Tworek, CNM [facility], [facility] 06/18/2017, 10:32 PM

## 2017-06-19 ENCOUNTER — Encounter: Payer: BLUE CROSS/BLUE SHIELD | Admitting: Certified Nurse Midwife

## 2017-06-19 ENCOUNTER — Inpatient Hospital Stay: Payer: BLUE CROSS/BLUE SHIELD | Admitting: Anesthesiology

## 2017-06-19 ENCOUNTER — Encounter: Payer: Self-pay | Admitting: Anesthesiology

## 2017-06-19 DIAGNOSIS — Z3A37 37 weeks gestation of pregnancy: Secondary | ICD-10-CM

## 2017-06-19 DIAGNOSIS — O139 Gestational [pregnancy-induced] hypertension without significant proteinuria, unspecified trimester: Secondary | ICD-10-CM

## 2017-06-19 DIAGNOSIS — Z3483 Encounter for supervision of other normal pregnancy, third trimester: Secondary | ICD-10-CM

## 2017-06-19 LAB — GLUCOSE, CAPILLARY
Glucose-Capillary: 119 mg/dL — ABNORMAL HIGH (ref 65–99)
Glucose-Capillary: 116 mg/dL — ABNORMAL HIGH (ref 65–99)

## 2017-06-19 LAB — RPR: RPR Ser Ql: NONREACTIVE

## 2017-06-19 MED ORDER — BENZOCAINE-MENTHOL 20-0.5 % EX AERO
1.0000 "application " | INHALATION_SPRAY | CUTANEOUS | Status: DC | PRN
  Administered 2017-06-19: 1 via TOPICAL
  Filled 2017-06-19: qty 56

## 2017-06-19 MED ORDER — HYDROCORTISONE 1 % EX CREA
TOPICAL_CREAM | Freq: Four times a day (QID) | CUTANEOUS | Status: DC
  Administered 2017-06-19 (×2): via TOPICAL
  Filled 2017-06-19: qty 28

## 2017-06-19 MED ORDER — IBUPROFEN 600 MG PO TABS
600.0000 mg | ORAL_TABLET | Freq: Four times a day (QID) | ORAL | Status: DC
  Administered 2017-06-19 – 2017-06-20 (×5): 600 mg via ORAL
  Filled 2017-06-19 (×5): qty 1

## 2017-06-19 MED ORDER — PHENYLEPHRINE 40 MCG/ML (10ML) SYRINGE FOR IV PUSH (FOR BLOOD PRESSURE SUPPORT)
80.0000 ug | PREFILLED_SYRINGE | INTRAVENOUS | Status: DC | PRN
  Filled 2017-06-19: qty 5

## 2017-06-19 MED ORDER — LEVOTHYROXINE SODIUM 112 MCG PO TABS
112.0000 ug | ORAL_TABLET | Freq: Every day | ORAL | Status: DC
  Administered 2017-06-20: 112 ug via ORAL
  Filled 2017-06-19: qty 1

## 2017-06-19 MED ORDER — ACETAMINOPHEN 325 MG PO TABS: 650.0000 mg | ORAL_TABLET | ORAL | Status: DC | PRN

## 2017-06-19 MED ORDER — COCONUT OIL OIL: 1.0000 "application " | TOPICAL_OIL | Status: DC | PRN

## 2017-06-19 MED ORDER — PRENATAL MULTIVITAMIN CH
1.0000 | ORAL_TABLET | Freq: Every day | ORAL | Status: DC
Start: 2017-06-19 — End: 2017-06-20
  Administered 2017-06-19 – 2017-06-20 (×2): 1 via ORAL
  Filled 2017-06-19 (×2): qty 1

## 2017-06-19 MED ORDER — EPHEDRINE 5 MG/ML INJ
10.0000 mg | INTRAVENOUS | Status: DC | PRN
  Filled 2017-06-19: qty 2

## 2017-06-19 MED ORDER — ZOLPIDEM TARTRATE 5 MG PO TABS: 5.0000 mg | ORAL_TABLET | Freq: Every evening | ORAL | Status: DC | PRN

## 2017-06-19 MED ORDER — FENTANYL 2.5 MCG/ML W/ROPIVACAINE 0.15% IN NS 100 ML EPIDURAL (ARMC)
EPIDURAL | Status: AC
  Filled 2017-06-19: qty 100

## 2017-06-19 MED ORDER — FENTANYL 2.5 MCG/ML W/ROPIVACAINE 0.15% IN NS 100 ML EPIDURAL (ARMC)
12.0000 mL/h | EPIDURAL | Status: DC
  Administered 2017-06-19 (×2): 12 mL/h via EPIDURAL
  Filled 2017-06-19: qty 100

## 2017-06-19 MED ORDER — LACTATED RINGERS IV SOLN: 500.0000 mL | Freq: Once | INTRAVENOUS | Status: DC

## 2017-06-19 MED ORDER — DIBUCAINE 1 % RE OINT: 1.0000 "application " | TOPICAL_OINTMENT | RECTAL | Status: DC | PRN

## 2017-06-19 MED ORDER — ONDANSETRON HCL 4 MG PO TABS: 4.0000 mg | ORAL_TABLET | ORAL | Status: DC | PRN

## 2017-06-19 MED ORDER — BUPIVACAINE HCL (PF) 0.25 % IJ SOLN
INTRAMUSCULAR | Status: DC | PRN
  Administered 2017-06-19: 5 mL via EPIDURAL
  Administered 2017-06-19 (×2): 4 mL via EPIDURAL

## 2017-06-19 MED ORDER — DIPHENHYDRAMINE HCL 25 MG PO CAPS: 25.0000 mg | ORAL_CAPSULE | Freq: Four times a day (QID) | ORAL | Status: DC | PRN

## 2017-06-19 MED ORDER — SENNOSIDES-DOCUSATE SODIUM 8.6-50 MG PO TABS
2.0000 | ORAL_TABLET | ORAL | Status: DC
  Administered 2017-06-19: 2 via ORAL

## 2017-06-19 MED ORDER — DIPHENHYDRAMINE HCL 50 MG/ML IJ SOLN: 12.5000 mg | INTRAMUSCULAR | Status: DC | PRN

## 2017-06-19 MED ORDER — SIMETHICONE 80 MG PO CHEW: 80.0000 mg | CHEWABLE_TABLET | ORAL | Status: DC | PRN

## 2017-06-19 MED ORDER — ONDANSETRON HCL 4 MG/2ML IJ SOLN: 4.0000 mg | INTRAMUSCULAR | Status: DC | PRN

## 2017-06-19 MED ORDER — WITCH HAZEL-GLYCERIN EX PADS
1.0000 "application " | MEDICATED_PAD | CUTANEOUS | Status: DC | PRN
  Filled 2017-06-19: qty 100

## 2017-06-19 NOTE — Lactation Note (Signed)
This note was copied from a baby's chart. Lactation Consultation Note  Patient Name: Diane [name]: 06/19/2017 Reason for consult: Initial assessment   Maternal Data    Feeding Feeding Type: Breast Fed  LATCH Score Latch: Grasps breast easily, tongue down, lips flanged, rhythmical sucking.  Audible Swallowing: Spontaneous and intermittent  Type of Nipple: Everted at rest and after stimulation  Comfort (Breast/Nipple): Soft / non-tender  Hold (Positioning): Full assist, staff holds infant at breast  LATCH Score: 8  Interventions Interventions: Breast feeding basics reviewed;Assisted with latch;Skin to skin  Lactation Tools Discussed/Used     Consult Status Consult Status: PRN Follow-up type: In-patient    Trudee GripCarolyn P Yahmir Sokolov 06/19/2017, 3:57 PM

## 2017-06-19 NOTE — Anesthesia Procedure Notes (Signed)
Epidural Patient location during procedure: OB  Staffing Anesthesiologist: Chaniece Barbato, MD Performed: anesthesiologist   Preanesthetic Checklist Completed: patient identified, site marked, surgical consent, pre-op evaluation, timeout performed, IV checked, risks and benefits discussed and monitors and equipment checked  Epidural Patient position: sitting Prep: ChloraPrep Patient monitoring: heart rate, continuous pulse ox and blood pressure Approach: midline Location: L4-L5 Injection technique: LOR saline  Needle:  Needle type: Tuohy  Needle gauge: 18 G Needle length: 9 cm and 9 Catheter type: closed end flexible Catheter size: 20 Guage Test dose: negative and 1.5% lidocaine with Epi 1:200 K  Assessment Sensory level: T10 Events: blood not aspirated, injection not painful, no injection resistance, negative IV test and no paresthesia  Additional Notes   Patient tolerated the insertion well without complications.Reason for block:procedure for pain     

## 2017-06-19 NOTE — Progress Notes (Signed)
Inpatient Diabetes Program Recommendations  AACE/ADA: New Consensus Statement on Inpatient Glycemic Control (2015)  Target Ranges:  Prepandial:   less than 140 mg/dL      Peak postprandial:   less than 180 mg/dL (1-2 hours)      Critically ill patients:  140 - 180 mg/dL   Lab Results  Component Value Date   GLUCAP 119 (H) 06/19/2017   HGBA1C 5.4 12/11/2016    Review of Glycemic Control Results for Diane [name], Diamante A (MRN [id number]) as of 06/19/2017 10:55  Ref. Range 06/18/2017 12:59 06/18/2017 20:43 06/19/2017 09:20  Glucose-Capillary Latest Ref Range: 65 - 99 mg/dL 88 301113 (H) 601119 (H)     Diabetes history: gestational diabetes as noted by Dr. Velora Mediateobert Harris on 06/19/17 Outpatient Diabetes medications: none noted Current orders for Inpatient glycemic control: none  Inpatient Diabetes Program Recommendations:  Please review with the patient the increased risk of developing Type 2 diabetes.  Encourage patient to continue to check blood sugars post partum until blood sugar strips are gone.  Encourage patient to have an annual A1C and to seek medical attention for increased urination, increased thirst or any symptoms of diabetes.   Patient should try to maintain a diet low in concentrated sugars including avoiding juice and sodas. Patient is encouraged to exercise on a regular basis (when she is feeling able to) - aiming for 150 minutes of exercise per week.   A follow up A1C should be completed at the 6 week post partum appointment.  Susette RacerJulie Kjersti Dittmer, RN, BA, MHA, CDE Diabetes Coordinator Inpatient Diabetes Program  [phone] (Team Pager) [phone] [facility] Office) 06/19/2017 10:56 AM

## 2017-06-19 NOTE — Progress Notes (Signed)
Pt sitting up with N/V during part of this tracing. Monitor tracing maternal vs fetal heart tones. RN remains at bedside.

## 2017-06-19 NOTE — Progress Notes (Signed)
Diane [name] is a 31 y.o. G2P1001 at 3755w6d by ultrasound admitted for rupture of membranes  Subjective:  Pt sitting in bed, reports "hot spot" on right side after replacement epidural. FOB at bedside.  Denies difficulty breathing or respiratory distress, chest pain, abdominal pain, vaginal bleeding, dysuria, and leg pain or swelling.   Objective:  Temp:  [98 F (36.7 C)-98.4 F (36.9 C)] 98 F (36.7 C) (12/27 0300) Pulse Rate:  [60-136] 80 (12/27 0300) Resp:  [16-18] 16 (12/27 0300) BP: (95-135)/(49-87) 122/79 (12/27 0300) SpO2:  [95 %-99 %] 97 % (12/27 0300) Weight:  [206 lb (93.4 kg)] 206 lb (93.4 kg) (12/26 0818)  FHT:  FHR: 125 bpm, variability: moderate,  accelerations:  Present,  decelerations:  Present intermittent variable and early  UC:   irregular, every three (3) to four (4) minutes, soft resting tone  SVE:   Dilation: 3 Effacement (%): 70 Station: -2 Exam by:: [facility] RN  Labs:  Lab Results  Component Value Date   WBC 8.0 06/18/2017   HGB 12.7 06/18/2017   HCT 36.8 06/18/2017   MCV 84.5 06/18/2017   PLT 254 06/18/2017    Assessment:  Diane A Reynoldsis a 31 y.o.G2P0 at 2255w6d being admitted for term premature rupture of membranes, gestational diabetes-diet controlled, Rh positive, GBS negative  FHR Category I  Plan:  Restart pitocin.   Reviewed red flag symptoms and when to call.   Continue orders as written. Reassess as needed.    Gunnar BullaJenkins Michelle Vassie Kugel, CNM [facility], [facility] 06/19/2017, 3:49 AM

## 2017-06-19 NOTE — Progress Notes (Signed)
Diane [name] is a 31 y.o. G2P1001 at 6449w6d by ultrasound admitted for rupture of membranes  Subjective:  Pt sitting in bed, reports lower abdominal pain and lengthening contractions.  Denies difficulty breathing or respiratory distress, chest pain, vaginal bleeding, dysuria, and leg pain or swelling.   Objective:  Temp:  [97.9 F (36.6 C)-98.4 F (36.9 C)] 97.9 F (36.6 C) (12/27 0643) Pulse Rate:  [60-136] 83 (12/27 0537) Resp:  [16-18] 16 (12/27 0300) BP: (95-135)/(49-87) 115/75 (12/27 0537) SpO2:  [94 %-99 %] 97 % (12/27 0635) Weight:  [206 lb (93.4 kg)] 206 lb (93.4 kg) (12/26 0818)  FHT:  FHR: 125 bpm, variability: moderate,  accelerations:  Present,  decelerations:  Present intermittent variable and early  UC:   irregular, every three (3) to four (4) minutes, soft resting tone  SVE:   Dilation: 6.5 Effacement (%): 80, 90 Station: -1 Exam by:: Willodean RosenthalM. Madalina Rosman CNM  Labs:  Lab Results  Component Value Date   WBC 8.0 06/18/2017   HGB 12.7 06/18/2017   HCT 36.8 06/18/2017   MCV 84.5 06/18/2017   PLT 254 06/18/2017    Assessment:  Diane A Reynoldsis a 31 y.o.G2P0 at 5749w6d being admitted for term premature rupture of membranes, gestational diabetes-diet controlled, Rh positive, GBS negative  FHR Category I  Plan:  Restart pitocin.   Reviewed red flag symptoms and when to call.   Continue orders as written. Reassess as needed.    Gunnar BullaJenkins Michelle Koriana Stepien, CNM [facility], [facility] 06/19/2017, 6:53 AM

## 2017-06-19 NOTE — Anesthesia Preprocedure Evaluation (Signed)
Anesthesia Evaluation  Patient identified by MRN, date of birth, ID band Patient awake    Reviewed: Allergy & Precautions, NPO status , Patient's Chart, lab work & pertinent test results, reviewed documented beta blocker date and time   Airway Mallampati: III  TM Distance: >3 FB     Dental  (+) Chipped   Pulmonary           Cardiovascular      Neuro/Psych    GI/Hepatic   Endo/Other  diabetes, Type 2Hypothyroidism   Renal/GU      Musculoskeletal   Abdominal   Peds  Hematology   Anesthesia Other Findings Obese.  Reproductive/Obstetrics                             Anesthesia Physical Anesthesia Plan  ASA: II  Anesthesia Plan: Epidural   Post-op Pain Management:    Induction:   PONV Risk Score and Plan:   Airway Management Planned:   Additional Equipment:   Intra-op Plan:   Post-operative Plan:   Informed Consent: I have reviewed the patients History and Physical, chart, labs and discussed the procedure including the risks, benefits and alternatives for the proposed anesthesia with the patient or authorized representative who has indicated his/her understanding and acceptance.     Plan Discussed with: CRNA  Anesthesia Plan Comments:         Anesthesia Quick Evaluation

## 2017-06-20 ENCOUNTER — Encounter: Payer: Self-pay | Admitting: Emergency Medicine

## 2017-06-20 LAB — CBC
HEMATOCRIT: 34.7 % — AB (ref 35.0–47.0)
Hemoglobin: 11.7 g/dL — ABNORMAL LOW (ref 12.0–16.0)
MCH: 28.7 pg (ref 26.0–34.0)
MCHC: 33.7 g/dL (ref 32.0–36.0)
MCV: 85.3 fL (ref 80.0–100.0)
Platelets: 214 10*3/uL (ref 150–440)
RBC: 4.06 MIL/uL (ref 3.80–5.20)
RDW: 14.7 % — AB (ref 11.5–14.5)
WBC: 12.2 10*3/uL — ABNORMAL HIGH (ref 3.6–11.0)

## 2017-06-20 LAB — GLUCOSE, RANDOM: Glucose, Bld: 87 mg/dL (ref 65–99)

## 2017-06-20 MED ORDER — IBUPROFEN 600 MG PO TABS: 600.0000 mg | ORAL_TABLET | Freq: Four times a day (QID) | ORAL | 0 refills | Status: DC

## 2017-06-20 NOTE — Discharge Summary (Signed)
Obstetric Discharge Summary  Patient ID: Diane [name] [place] MRN: [id number] DOB/AGE: 32-Nov-1987 32 y.o.   Date of Admission: 06/18/2017 Serafina RoyalsMichelle Lawhorn, CNM [facility]([name]. Cherry, MD)  Date of Discharge: Serafina RoyalsMichelle Lawhorn, CNM [facility]([name]. Cherry, MD)  Admitting Diagnosis: Premature rupture of membrane at 1820w1d  Secondary Diagnosis: Gestational diabetes diet controlled (A1)  Mode of Delivery: normal spontaneous vaginal delivery     Discharge Diagnosis: No other diagnosis   Intrapartum Procedures: epidural   Post partum procedures: None  Complications: First degree perineal laceration   Brief Hospital Course   Diane [name] Kinsel is [name] G2P1001 who had [name] SVD on 06/19/2017;  for further details of this birth, please refer to the delivey note.  Patient had an uncomplicated postpartum course.  By time of discharge on PPD#1, her pain was controlled on oral pain medications; she had appropriate lochia and was ambulating, voiding without difficulty and tolerating regular diet.  She was deemed stable for discharge to home.    Labs:  CBC Latest Ref Rng & Units 06/20/2017 06/18/2017 04/16/2017  WBC 3.6 - 11.0 K/uL 12.2(H) 8.0 8.7  Hemoglobin 12.0 - 16.0 g/dL 11.7(L) 12.7 11.8  Hematocrit 35.0 - 47.0 % 34.7(L) 36.8 37.2  Platelets 150 - 440 K/uL 214 254 294   O POS  Physical exam:   Temp:  [97.7 F (36.5 C)-98.2 F (36.8 C)] 98 F (36.7 C) (12/28 0427) Pulse Rate:  [70-87] 70 (12/28 0427) Resp:  [16-20] 20 (12/28 0427) BP: (100-138)/(47-80) 108/47 (12/28 0427) SpO2:  [96 %-99 %] 98 % (12/27 2001)  General: alert and no distress  Lochia: appropriate  Abdomen: soft, NT  Uterine Fundus: firm  Perineum: healing well, no significant drainage, no dehiscence, no significant  erythema  Extremities: No evidence of DVT seen on physical exam. No lower extremity edema.  Discharge Instructions: Per After Visit Summary.  Activity: Advance as tolerated. Pelvic rest for 6 weeks.  Also refer to After  Visit Summary  Diet: Regular  Medications:  Allergies as of 06/20/2017   No Known Allergies     Medication List    STOP taking these medications   glucose blood test strip   MAGNESIUM-OXIDE 400 (241.3 Mg) MG tablet Generic drug:  magnesium oxide   MICROLET LANCETS Misc     TAKE these medications   albuterol 108 (90 Base) MCG/ACT inhaler Commonly known as:  PROAIR HFA Inhale 2 puffs into the lungs every 6 (six) hours as needed for wheezing or shortness of breath.   docusate sodium 100 MG capsule Commonly known as:  COLACE Take 100 mg by mouth 2 (two) times daily.   ibuprofen 600 MG tablet Commonly known as:  ADVIL,MOTRIN Take 1 tablet (600 mg total) by mouth every 6 (six) hours.   levothyroxine 112 MCG tablet Commonly known as:  SYNTHROID, LEVOTHROID TAKE 1 TABLET(112 MCG) BY MOUTH DAILY BEFORE BREAKFAST   montelukast 10 MG tablet Commonly known as:  SINGULAIR Take 1 tablet (10 mg total) by mouth at bedtime.   prenatal multivitamin Tabs tablet Take 1 tablet by mouth daily at 12 noon.      Outpatient follow up:  Follow-up Information    Gunnar BullaLawhorn, Jenkins Michelle, CNM. Call.   Specialties:  Certified Nurse Midwife, Obstetrics and Gynecology, Radiology Why:  Please call to schedule six (6) week postpartum visit with Serafina RoyalsMichelle Lawhorn, CNM Contact information: [address] [place] [id number] [phone]          Postpartum contraception: abstinence  Discharged Condition: stable  Discharged to: home   Newborn Data:  Disposition:home with mother  Apgars: APGAR (1 MIN): 9   APGAR (5 MINS): 9      Baby Feeding: Breast   Gunnar BullaJenkins Michelle Lawhorn, CNM [facility], [facility]

## 2017-06-20 NOTE — Progress Notes (Signed)
Reviewed all patients discharge instructions and handouts regarding postpartum bleeding, no intercourse for 6 weeks, signs and symptoms of mastitis and postpartum bleu's. Reviewed discharge instructions for newborn regarding proper cord care, how and when to bathe the newborn, nail care, proper way to take the baby's temperature, along with safe sleep. All questions have been answered at this time. Patient discharged via wheelchair with RN.  

## 2017-06-20 NOTE — Discharge Instructions (Signed)
Breastfeeding Challenges and Solutions Even though breastfeeding is natural, it can be challenging, especially in the first few weeks after childbirth. It is normal for problems to arise when starting to breastfeed your new baby, even if you have breastfed before. This document provides some solutions to the most common breastfeeding challenges. Challenges and solutions Challenge--Cracked or Sore Nipples Cracked or sore nipples are commonly experienced by breastfeeding mothers. Cracked or sore nipples often are caused by inadequate latching (when your baby's mouth attaches to your breast to breastfeed). Soreness can also happen if your baby is not positioned properly at your breast. Although nipple cracking and soreness are common during the first week after birth, nipple pain is never normal. If you experience nipple cracking or soreness that lasts longer than 1 week or nipple pain, call your health care provider or lactation consultant. Solution Ensure proper latching and positioning of your baby by following the steps below:  Find a comfortable place to sit or lie down, with your neck and back well supported.  Place a pillow or rolled up blanket under your baby to bring him or her to the level of your breast (if you are seated).  Make sure that your baby's abdomen is facing your abdomen.  Gently massage your breast. With your fingertips, massage from your chest wall toward your nipple in a circular motion. This encourages milk flow. You may need to continue this action during the feeding if your milk flows slowly.  Support your breast with 4 fingers underneath and your thumb above your nipple. Make sure your fingers are well away from your nipple and your babys mouth.  Stroke your baby's lips gently with your finger or nipple.  When your baby's mouth is open wide enough, quickly bring your baby to your breast, placing your entire nipple and as much of the colored area around your nipple  (areola) as possible into your baby's mouth. ? More areola should be visible above your baby's upper lip than below the lower lip. ? Your baby's tongue should be between his or her lower gum and your breast.  Ensure that your baby's mouth is correctly positioned around your nipple (latched). Your baby's lips should create a seal on your breast and be turned out (everted).  It is common for your baby to suck for about 2-3 minutes in order to start the flow of breast milk.  Signs that your baby has successfully latched on to your nipple include:  Quietly tugging or quietly sucking without causing you pain.  Swallowing heard between every 3-4 sucks.  Muscle movement above and in front of his or her ears with sucking.  Signs that your baby has not successfully latched on to nipple include:  Sucking sounds or smacking sounds from your baby while nursing.  Nipple pain.  Ensure that your breasts stay moisturized and healthy by:  Avoiding the use of soap on your nipples.  Wearing a supportive bra. Avoid wearing underwire-style bras or tight bras.  Air drying your nipples for 3-4 minutes after each feeding.  Using only cotton bra pads to absorb breast milk leakage. Leaking of breast milk between feedings is normal. Be sure to change the pads if they become soaked with milk.  Using lanolin on your nipples after nursing. Lanolin helps to maintain your skin's normal moisture barrier. If you use pure lanolin you do not need to wash it off before feeding your baby again. Pure lanolin is not toxic to your baby. You may also  hand express a few drops of breast milk and gently massage that milk into your nipples, allowing it to air dry.  Challenge--Breast Engorgement Breast engorgement is the overfilling of your breasts with breast milk. In the first few weeks after giving birth, you may experience breast engorgement. Breast engorgement can make your breasts throb and feel hard, tightly stretched,  warm, and tender. Engorgement peaks about the fifth day after you give birth. Having breast engorgement does not mean you have to stop breastfeeding your baby. Solution  Breastfeed when you feel the need to reduce the fullness of your breasts or when your baby shows signs of hunger. This is called "breastfeeding on demand."  Newborns (babies younger than 4 weeks) often breastfeed every 1-3 hours during the day. You may need to awaken your baby to feed if he or she is asleep at a feeding time.  Do not allow your baby to sleep longer than 5 hours during the night without a feeding.  Pump or hand express breast milk before breastfeeding to soften your breast, areola, and nipple.  Apply warm, moist heat (in the shower or with warm water-soaked hand towels) just before feeding or pumping, or massage your breast before or during breastfeeding. This increases circulation and helps your milk to flow.  Completely empty your breasts when breastfeeding or pumping. Afterward, wear a snug bra (nursing or regular) or tank top for 1-2 days to signal your body to slightly decrease milk production. Only wear snug bras or tank tops to treat engorgement. Tight bras typically should be avoided by breastfeeding mothers. Once engorgement is relieved, return to wearing regular, loose-fitting clothes.  Apply ice packs to your breasts to lessen the pain from engorgement and relieve swelling, unless the ice is uncomfortable for you.  Do not delay feedings. Try to relax when it is time to feed your baby. This helps to trigger your "let-down reflex," which releases milk from your breast.  Ensure your baby is latched on to your breast and positioned properly while breastfeeding.  Allow your baby to remain at your breast as long as he or she is latched on well and actively sucking. Your baby will let you know when he or she is done breastfeeding by pulling away from your breast or falling asleep.  Avoid introducing bottles  or pacifiers to your baby in the early weeks of breastfeeding. Wait to introduce these things until after resolving any breastfeeding challenges.  Try to pump your milk on the same schedule as when your baby would breastfeed if you are returning to work or away from home for an extended period.  Drink plenty of fluids to avoid dehydration, which can eventually put you at greater risk of breast engorgement.  If you follow these suggestions, your engorgement should improve in 24-48 hours. If you are still experiencing difficulty, call your lactation consultant or health care provider. Challenge--Plugged Milk Ducts Plugged milk ducts occur when the duct does not drain milk effectively and becomes swollen. Wearing a tight-fitting nursing bra or having difficulty with latching may cause plugged milk ducts. Not drinking enough water (8-10 c [1.9-2.4 L] per day) can contribute to plugged milk ducts. Once a duct has become plugged, hard lumps, soreness, and redness may develop in your breast. Solution Do not delay feedings. Feed your baby frequently and try to empty your breasts of milk at each feeding. Try breastfeeding from the affected side first so there is a better chance that the milk will drain completely from  that breast. Apply warm, moist towels to your breasts for 5-10 minutes before feeding. Alternatively, a hot shower right before breastfeeding can provide the moist heat that can encourage milk flow. Gentle massage of the sore area before and during a feeding may also help. Avoid wearing tight clothing or bras that put pressure on your breasts. Wear bras that offer good support to your breasts, but avoid underwire bras. If you have a plugged milk duct and develop a fever, you need to see your health care provider. Challenge--Mastitis Mastitis is inflammation of your breast. It usually is caused by a bacterial infection and can cause flu-like symptoms. You may develop redness in your breast and a  fever. Often when mastitis occurs, your breast becomes firm, warm, and very painful. The most common causes of mastitis are poor latching, ineffective sucking from your baby, consistent pressure on your breast (possibly from wearing a tight-fitting bra or shirt that restricts the milk flow), unusual stress or fatigue, or missed feedings. Solution You will be given antibiotic medicine to treat the infection. It is still important to breastfeed frequently to empty your breasts. Continuing to breastfeed while you recover from mastitis will not harm your baby. Make sure your baby is positioned properly during every feeding. Apply moist heat to your breasts for a few minutes before feeding to help the milk flow and to help your breasts empty more easily. Challenge--Thrush Diane [name] is a yeast infection that can form on your nipples, in your breast, or in your baby's mouth. It causes itching, soreness, burning or stabbing pain, and sometimes a rash. Solution You will be given a medicated ointment for your nipples, and your baby will be given a liquid medicine for his or her mouth. It is important that you and your baby are treated at the same time because thrush can be passed between you and your baby. Change disposable nursing pads often. Any bras, towels, or clothing that come in contact with infected areas of your body or your baby's body need to be washed in very hot water every day. Wash your hands and your baby's hands often. All pacifiers, bottle nipples, or toys your baby puts in his or her mouth should be boiled once a day for 20 minutes. After 1 week of treatment, discard pacifiers and bottle nipples and buy new ones. All breast pump parts that touch the milk need to be boiled for 20 minutes every day. Challenge--Low Milk Supply You may not be producing enough milk if your baby is not gaining the proper amount of weight. Breast milk production is based on a supply-and-demand system. Your milk supply depends  on how frequently and effectively your baby empties your breast. Solution The more you breastfeed and pump, the more breast milk you will produce. It is important that your baby empties at least one of your breasts at each feeding. If this is not happening, then use a breast pump or hand express any milk that remains. This will help to drain as much milk as possible at each feeding. It will also signal your body to produce more milk. If your baby is not emptying your breasts, it may be due to latching, sucking, or positioning problems. If low milk supply continues after addressing these issues, contact your health care provider or a lactation specialist as soon as possible. Challenge--Inverted or Flat Nipples Some women have nipples that turn inward instead of protruding outward. Other women have nipples that are flat. Inverted or flat nipples can  sometimes make it more difficult for your baby to latch onto your breast. Solution You may be given a small device that pulls out inverted nipples. This device should be applied right before your baby is brought to your breast. You can also try using a breast pump for a short time before placing the baby at your breast. The pump can pull your nipple outwards to help your infant latch more easily. The baby's sucking motion will help the inverted nipple protrude as well. If you have flat nipples, encourage your baby to latch onto your breast and feed frequently in the early days after birth. This will give your baby practice latching on correctly while your breast is still soft. When your milk supply increases, between the second and fifth day after birth and your breasts become full, your baby will have an easier time latching. Contact a lactation consultant if you still have concerns. She or he can teach you additional techniques to address breastfeeding problems related to nipple shape and position. Where to find more information: [employer]:  www.llli.org This information is not intended to replace advice given to you by your health care provider. Make sure you discuss any questions you have with your health care provider. Document Released: 12/02/2005 Document Revised: 11/22/2015 Document Reviewed: 12/04/2012 Else[employer] Interactive Patient Education  2017 [employer]. Breastfeeding Choosing to breastfeed is one of the best decisions you can make for yourself and your baby. A change in hormones during pregnancy causes your breasts to make breast milk in your milk-producing glands. Hormones prevent breast milk from being released before your baby is born. They also prompt milk flow after birth. Once breastfeeding has begun, thoughts of your baby, as well as his or her sucking or crying, can stimulate the release of milk from your milk-producing glands. Benefits of breastfeeding Research shows that breastfeeding offers many health benefits for infants and mothers. It also offers a cost-free and convenient way to feed your baby. For your baby  Your first milk (colostrum) helps your baby's digestive system to function better.  Special cells in your milk (antibodies) help your baby to fight off infections.  Breastfed babies are less likely to develop asthma, allergies, obesity, or type 2 diabetes. They are also at lower risk for sudden infant death syndrome (SIDS).  Nutrients in breast milk are better able to meet your babys needs compared to infant formula.  Breast milk improves your baby's brain development. For you  Breastfeeding helps to create a very special bond between you and your baby.  Breastfeeding is convenient. Breast milk costs nothing and is always available at the correct temperature.  Breastfeeding helps to burn calories. It helps you to lose the weight that you gained during pregnancy.  Breastfeeding makes your uterus return faster to its size before pregnancy. It also slows bleeding (lochia) after you give  birth.  Breastfeeding helps to lower your risk of developing type 2 diabetes, osteoporosis, rheumatoid arthritis, cardiovascular disease, and breast, ovarian, uterine, and endometrial cancer later in life. Breastfeeding basics Starting breastfeeding  Find a comfortable place to sit or lie down, with your neck and back well-supported.  Place a pillow or a rolled-up blanket under your baby to bring him or her to the level of your breast (if you are seated). Nursing pillows are specially designed to help support your arms and your baby while you breastfeed.  Make sure that your baby's tummy (abdomen) is facing your abdomen.  Gently massage your breast.  With your fingertips, massage from the outer edges of your breast inward toward the nipple. This encourages milk flow. If your milk flows slowly, you may need to continue this action during the feeding.  Support your breast with 4 fingers underneath and your thumb above your nipple (make the letter "C" with your hand). Make sure your fingers are well away from your nipple and your babys mouth.  Stroke your baby's lips gently with your finger or nipple.  When your baby's mouth is open wide enough, quickly bring your baby to your breast, placing your entire nipple and as much of the areola as possible into your baby's mouth. The areola is the colored area around your nipple. ? More areola should be visible above your baby's upper lip than below the lower lip. ? Your baby's lips should be opened and extended outward (flanged) to ensure an adequate, comfortable latch. ? Your baby's tongue should be between his or her lower gum and your breast.  Make sure that your baby's mouth is correctly positioned around your nipple (latched). Your baby's lips should create a seal on your breast and be turned out (everted).  It is common for your baby to suck about 2-3 minutes in order to start the flow of breast milk. Latching Teaching your baby how to latch  onto your breast properly is very important. An improper latch can cause nipple pain, decreased milk supply, and poor weight gain in your baby. Also, if your baby is not latched onto your nipple properly, he or she may swallow some air during feeding. This can make your baby fussy. Burping your baby when you switch breasts during the feeding can help to get rid of the air. However, teaching your baby to latch on properly is still the best way to prevent fussiness from swallowing air while breastfeeding. Signs that your baby has successfully latched onto your nipple  Silent tugging or silent sucking, without causing you pain. Infant's lips should be extended outward (flanged).  Swallowing heard between every 3-4 sucks once your milk has started to flow (after your let-down milk reflex occurs).  Muscle movement above and in front of his or her ears while sucking.  Signs that your baby has not successfully latched onto your nipple  Sucking sounds or smacking sounds from your baby while breastfeeding.  Nipple pain.  If you think your baby has not latched on correctly, slip your finger into the corner of your babys mouth to break the suction and place it between your baby's gums. Attempt to start breastfeeding again. Signs of successful breastfeeding Signs from your baby  Your baby will gradually decrease the number of sucks or will completely stop sucking.  Your baby will fall asleep.  Your baby's body will relax.  Your baby will retain a small amount of milk in his or her mouth.  Your baby will let go of your breast by himself or herself.  Signs from you  Breasts that have increased in firmness, weight, and size 1-3 hours after feeding.  Breasts that are softer immediately after breastfeeding.  Increased milk volume, as well as a change in milk consistency and color by the fifth day of breastfeeding.  Nipples that are not sore, cracked, or bleeding.  Signs that your baby is  getting enough milk  Wetting at least 1-2 diapers during the first 24 hours after birth.  Wetting at least 5-6 diapers every 24 hours for the first week after birth. The urine should be  clear or pale yellow by the age of 5 days.  Wetting 6-8 diapers every 24 hours as your baby continues to grow and develop.  At least 3 stools in a 24-hour period by the age of 5 days. The stool should be soft and yellow.  At least 3 stools in a 24-hour period by the age of 7 days. The stool should be seedy and yellow.  No loss of weight greater than 10% of birth weight during the first 3 days of life.  Average weight gain of 4-7 oz (113-198 g) per week after the age of 4 days.  Consistent daily weight gain by the age of 5 days, without weight loss after the age of 2 weeks. After a feeding, your baby may spit up a small amount of milk. This is normal. Breastfeeding frequency and duration Frequent feeding will help you make more milk and can prevent sore nipples and extremely full breasts (breast engorgement). Breastfeed when you feel the need to reduce the fullness of your breasts or when your baby shows signs of hunger. This is called "breastfeeding on demand." Signs that your baby is hungry include:  Increased alertness, activity, or restlessness.  Movement of the head from side to side.  Opening of the mouth when the corner of the mouth or cheek is stroked (rooting).  Increased sucking sounds, smacking lips, cooing, sighing, or squeaking.  Hand-to-mouth movements and sucking on fingers or hands.  Fussing or crying.  Avoid introducing a pacifier to your baby in the first 4-6 weeks after your baby is born. After this time, you may choose to use a pacifier. Research has shown that pacifier use during the first year of a baby's life decreases the risk of sudden infant death syndrome (SIDS). Allow your baby to feed on each breast as long as he or she wants. When your baby unlatches or falls asleep while  feeding from the first breast, offer the second breast. Because newborns are often sleepy in the first few weeks of life, you may need to awaken your baby to get him or her to feed. Breastfeeding times will vary from baby to baby. However, the following rules can serve as a guide to help you make sure that your baby is properly fed:  Newborns (babies 36 weeks of age or younger) may breastfeed every 1-3 hours.  Newborns should not go without breastfeeding for longer than 3 hours during the day or 5 hours during the night.  You should breastfeed your baby a minimum of 8 times in a 24-hour period.  Breast milk pumping Pumping and storing breast milk allows you to make sure that your baby is exclusively fed your breast milk, even at times when you are unable to breastfeed. This is especially important if you go back to work while you are still breastfeeding, or if you are not able to be present during feedings. Your lactation consultant can help you find a method of pumping that works best for you and give you guidelines about how long it is safe to store breast milk. Caring for your breasts while you breastfeed Nipples can become dry, cracked, and sore while breastfeeding. The following recommendations can help keep your breasts moisturized and healthy:  Avoid using soap on your nipples.  Wear a supportive bra designed especially for nursing. Avoid wearing underwire-style bras or extremely tight bras (sports bras).  Air-dry your nipples for 3-4 minutes after each feeding.  Use only cotton bra pads to absorb leaked breast  milk. Leaking of breast milk between feedings is normal.  Use lanolin on your nipples after breastfeeding. Lanolin helps to maintain your skin's normal moisture barrier. Pure lanolin is not harmful (not toxic) to your baby. You may also hand express a few drops of breast milk and gently massage that milk into your nipples and allow the milk to air-dry.  In the first few weeks  after giving birth, some women experience breast engorgement. Engorgement can make your breasts feel heavy, warm, and tender to the touch. Engorgement peaks within 3-5 days after you give birth. The following recommendations can help to ease engorgement:  Completely empty your breasts while breastfeeding or pumping. You may want to start by applying warm, moist heat (in the shower or with warm, water-soaked hand towels) just before feeding or pumping. This increases circulation and helps the milk flow. If your baby does not completely empty your breasts while breastfeeding, pump any extra milk after he or she is finished.  Apply ice packs to your breasts immediately after breastfeeding or pumping, unless this is too uncomfortable for you. To do this: ? Put ice in a plastic bag. ? Place a towel between your skin and the bag. ? Leave the ice on for 20 minutes, 2-3 times a day.  Make sure that your baby is latched on and positioned properly while breastfeeding.  If engorgement persists after 48 hours of following these recommendations, contact your health care provider or a Science writer. Overall health care recommendations while breastfeeding  Eat 3 healthy meals and 3 snacks every day. Well-nourished mothers who are breastfeeding need an additional 450-500 calories a day. You can meet this requirement by increasing the amount of a balanced diet that you eat.  Drink enough water to keep your urine pale yellow or clear.  Rest often, relax, and continue to take your prenatal vitamins to prevent fatigue, stress, and low vitamin and mineral levels in your body (nutrient deficiencies).  Do not use any products that contain nicotine or tobacco, such as cigarettes and e-cigarettes. Your baby may be harmed by chemicals from cigarettes that pass into breast milk and exposure to secondhand smoke. If you need help quitting, ask your health care provider.  Avoid alcohol.  Do not use illegal drugs or  marijuana.  Talk with your health care provider before taking any medicines. These include over-the-counter and prescription medicines as well as vitamins and herbal supplements. Some medicines that may be harmful to your baby can pass through breast milk.  It is possible to become pregnant while breastfeeding. If birth control is desired, ask your health care provider about options that will be safe while breastfeeding your baby. Where to find more information: [employer]: www.llli.org Contact a health care provider if:  You feel like you want to stop breastfeeding or have become frustrated with breastfeeding.  Your nipples are cracked or bleeding.  Your breasts are red, tender, or warm.  You have: ? Painful breasts or nipples. ? A swollen area on either breast. ? A fever or chills. ? Nausea or vomiting. ? Drainage other than breast milk from your nipples.  Your breasts do not become full before feedings by the fifth day after you give birth.  You feel sad and depressed.  Your baby is: ? Too sleepy to eat well. ? Having trouble sleeping. ? More than 37 week old and wetting fewer than 6 diapers in a 24-hour period. ? Not gaining weight by 43 days of age.  Your baby has fewer than 3 stools in a 24-hour period.  Your baby's skin or the white parts of his or her eyes become yellow. Get help right away if:  Your baby is overly tired (lethargic) and does not want to wake up and feed.  Your baby develops an unexplained fever. Summary  Breastfeeding offers many health benefits for infant and mothers.  Try to breastfeed your infant when he or she shows early signs of hunger.  Gently tickle or stroke your baby's lips with your finger or nipple to allow the baby to open his or her mouth. Bring the baby to your breast. Make sure that much of the areola is in your baby's mouth. Offer one side and burp the baby before you offer the other side.  Talk with your  health care provider or lactation consultant if you have questions or you face problems as you breastfeed. This information is not intended to replace advice given to you by your health care provider. Make sure you discuss any questions you have with your health care provider. Document Released: 06/10/2005 Document Revised: 07/12/2016 Document Reviewed: 07/12/2016 Elsevier Interactive Patient Education  2018 [place] Instructions for Mom ACTIVITY  Gradually return to your regular activities.  Let yourself rest. Nap while your baby sleeps.  Avoid lifting anything that is heavier than 10 lb (4.5 kg) until your health care provider says it is okay.  Avoid activities that take a lot of effort and energy (are strenuous) until approved by your health care provider. Walking at a slow-to-moderate pace is usually safe.  If you had a cesarean delivery: ? Do not vacuum, climb stairs, or drive a car for 4-6 weeks. ? Have someone help you at home until you feel like you can do your usual activities yourself. ? Do exercises as told by your health care provider, if this applies.  VAGINAL BLEEDING You may continue to bleed for 4-6 weeks after delivery. Over time, the amount of blood usually decreases and the color of the blood usually gets lighter. However, the flow of bright red blood may increase if you have been too active. If you need to use more than one pad in an hour because your pad gets soaked, or if you pass a large clot:  Lie down.  Raise your feet.  Place a cold compress on your lower abdomen.  Rest.  Call your health care provider.  If you are breastfeeding, your period should return anytime between 8 weeks after delivery and the time that you stop breastfeeding. If you are not breastfeeding, your period should return 6-8 weeks after delivery. PERINEAL CARE The perineal area, or perineum, is the part of your body between your thighs. After delivery, this area needs  special care. Follow these instructions as told by your health care provider.  Take warm tub baths for 15-20 minutes.  Use medicated pads and pain-relieving sprays and creams as told.  Do not use tampons or douches until vaginal bleeding has stopped.  Each time you go to the bathroom: ? Use a peri bottle. ? Change your pad. ? Use towelettes in place of toilet paper until your stitches have healed.  Do Kegel exercises every day. Kegel exercises help to maintain the muscles that support the vagina, bladder, and bowels. You can do these exercises while you are standing, sitting, or lying down. To do Kegel exercises: ? Tighten the muscles of your abdomen and the muscles that surround your birth canal. ?  Hold for a few seconds. ? Relax. ? Repeat until you have done this 5 times in a row.  To prevent hemorrhoids from developing or getting worse: ? Drink enough fluid to keep your urine clear or pale yellow. ? Avoid straining when having a bowel movement. ? Take over-the-counter medicines and stool softeners as told by your health care provider.  BREAST CARE  Wear a tight-fitting bra.  Avoid taking over-the-counter pain medicine for breast discomfort.  Apply ice to the breasts to help with discomfort as needed: ? Put ice in a plastic bag. ? Place a towel between your skin and the bag. ? Leave the ice on for 20 minutes or as told by your health care provider.  NUTRITION  Eat a well-balanced diet.  Do not try to lose weight quickly by cutting back on calories.  Take your prenatal vitamins until your postpartum checkup or until your health care provider tells you to stop.  POSTPARTUM DEPRESSION You may find yourself crying for no apparent reason and unable to cope with all of the changes that come with having a newborn. This mood is called postpartum depression. Postpartum depression happens because your hormone levels change after delivery. If you have postpartum depression, get  support from your partner, friends, and family. If the depression does not go away on its own after several weeks, contact your health care provider. BREAST SELF-EXAM Do a breast self-exam each month, at the same time of the month. If you are breastfeeding, check your breasts just after a feeding, when your breasts are less full. If you are breastfeeding and your period has started, check your breasts on day 5, 6, or 7 of your period. Report any lumps, bumps, or discharge to your health care provider. Know that breasts are normally lumpy if you are breastfeeding. This is temporary, and it is not a health risk. INTIMACY AND SEXUALITY Avoid sexual activity for at least 3-4 weeks after delivery or until the brownish-red vaginal flow is completely gone. If you want to avoid pregnancy, use some form of birth control. You can get pregnant after delivery, even if you have not had your period. SEEK MEDICAL CARE IF:  You feel unable to cope with the changes that a child brings to your life, and these feelings do not go away after several weeks.  You notice a lump, a bump, or discharge on your breast.  SEEK IMMEDIATE MEDICAL CARE IF:  Blood soaks your pad in 1 hour or less.  You have: ? Severe pain or cramping in your lower abdomen. ? A bad-smelling vaginal discharge. ? A fever that is not controlled by medicine. ? A fever, and an area of your breast is red and sore. ? Pain or redness in your calf. ? Sudden, severe chest pain. ? Shortness of breath. ? Painful or bloody urination. ? Problems with your vision.  You vomit for 12 hours or longer.  You develop a severe headache.  You have serious thoughts about hurting yourself, your child, or anyone else.  This information is not intended to replace advice given to you by your health care provider. Make sure you discuss any questions you have with your health care provider. Document Released: 06/07/2000 Document Revised: 11/16/2015 Document  Reviewed: 12/12/2014 Elsevier Interactive Patient Education  2017 [place]. Postpartum Care After Vaginal Delivery The period of time right after you deliver your newborn is called the postpartum period. What kind of medical care will I receive?  You may continue  to receive fluids and medicines through an IV tube inserted into one of your veins.  If an incision was made near your vagina (episiotomy) or if you had some vaginal tearing during delivery, cold compresses may be placed on your episiotomy or your tear. This helps to reduce pain and swelling.  You may be given a squirt bottle to use when you go to the bathroom. You may use this until you are comfortable wiping as usual. To use the squirt bottle, follow these steps: ? Before you urinate, fill the squirt bottle with warm water. Do not use hot water. ? After you urinate, while you are sitting on the toilet, use the squirt bottle to rinse the area around your urethra and vaginal opening. This rinses away any urine and blood. ? You may do this instead of wiping. As you start healing, you may use the squirt bottle before wiping yourself. Make sure to wipe gently. ? Fill the squirt bottle with clean water every time you use the bathroom.  You will be given sanitary pads to wear. How can I expect to feel?  You may not feel the need to urinate for several hours after delivery.  You will have some soreness and pain in your abdomen and vagina.  If you are breastfeeding, you may have uterine contractions every time you breastfeed for up to several weeks postpartum. Uterine contractions help your uterus return to its normal size.  It is normal to have vaginal bleeding (lochia) after delivery. The amount and appearance of lochia is often similar to a menstrual period in the first week after delivery. It will gradually decrease over the next few weeks to a dry, yellow-brown discharge. For most women, lochia stops completely by 6-8 weeks after  delivery. Vaginal bleeding can vary from woman to woman.  Within the first few days after delivery, you may have breast engorgement. This is when your breasts feel heavy, full, and uncomfortable. Your breasts may also throb and feel hard, tightly stretched, warm, and tender. After this occurs, you may have milk leaking from your breasts.Your health care provider can help you relieve discomfort due to breast engorgement. Breast engorgement should go away within a few days.  You may feel more sad or worried than normal due to hormonal changes after delivery. These feelings should not last more than a few days. If these feelings do not go away after several days, speak with your health care provider. How should I care for myself?  Tell your health care provider if you have pain or discomfort.  Drink enough water to keep your urine clear or pale yellow.  Wash your hands thoroughly with soap and water for at least 20 seconds after changing your sanitary pads, after using the toilet, and before holding or feeding your baby.  If you are not breastfeeding, avoid touching your breasts a lot. Doing this can make your breasts produce more milk.  If you become weak or lightheaded, or you feel like you might faint, ask for help before: ? Getting out of bed. ? Showering.  Change your sanitary pads frequently. Watch for any changes in your flow, such as a sudden increase in volume, a change in color, the passing of large blood clots. If you pass a blood clot from your vagina, save it to show to your health care provider. Do not flush blood clots down the toilet without having your health care provider look at them.  Make sure that all your vaccinations are  up to date. This can help protect you and your baby from getting certain diseases. You may need to have immunizations done before you leave the hospital.  If desired, talk with your health care provider about methods of family planning or birth control  (contraception). How can I start bonding with my baby? Spending as much time as possible with your baby is very important. During this time, you and your baby can get to know each other and develop a bond. Having your baby stay with you in your room (rooming in) can give you time to get to know your baby. Rooming in can also help you become comfortable caring for your baby. Breastfeeding can also help you bond with your baby. How can I plan for returning home with my baby?  Make sure that you have a car seat installed in your vehicle. ? Your car seat should be checked by a certified car seat installer to make sure that it is installed safely. ? Make sure that your baby fits into the car seat safely.  Ask your health care provider any questions you have about caring for yourself or your baby. Make sure that you are able to contact your health care provider with any questions after leaving the hospital. This information is not intended to replace advice given to you by your health care provider. Make sure you discuss any questions you have with your health care provider. Document Released: 04/07/2007 Document Revised: 11/13/2015 Document Reviewed: 05/15/2015 Elsevier Interactive Patient Education  2018 [employer]. Postpartum Depression and Baby Blues The postpartum period begins right after the birth of a baby. During this time, there is often a great amount of joy and excitement. It is also a time of many changes in the life of the parents. Regardless of how many times a mother gives birth, each child brings new challenges and dynamics to the family. It is not unusual to have feelings of excitement along with confusing shifts in moods, emotions, and thoughts. All mothers are at risk of developing postpartum depression or the "baby blues." These mood changes can occur right after giving birth, or they may occur many months after giving birth. The baby blues or postpartum depression can be mild or  severe. Additionally, postpartum depression can go away rather quickly, or it can be a long-term condition. What are the causes? Raised hormone levels and the rapid drop in those levels are thought to be a main cause of postpartum depression and the baby blues. A number of hormones change during and after pregnancy. Estrogen and progesterone usually decrease right after the delivery of your baby. The levels of thyroid hormone and various cortisol steroids also rapidly drop. Other factors that play a role in these mood changes include major life events and genetics. What increases the risk? If you have any of the following risks for the baby blues or postpartum depression, know what symptoms to watch out for during the postpartum period. Risk factors that may increase the likelihood of getting the baby blues or postpartum depression include:  Having a personal or family history of depression.  Having depression while being pregnant.  Having premenstrual mood issues or mood issues related to oral contraceptives.  Having a lot of life stress.  Having marital conflict.  Lacking a social support network.  Having a baby with special needs.  Having health problems, such as diabetes.  What are the signs or symptoms? Symptoms of baby blues include:  Brief changes in mood, such  as going from extreme happiness to sadness.  Decreased concentration.  Difficulty sleeping.  Crying spells, tearfulness.  Irritability.  Anxiety.  Symptoms of postpartum depression typically begin within the first month after giving birth. These symptoms include:  Difficulty sleeping or excessive sleepiness.  Marked weight loss.  Agitation.  Feelings of worthlessness.  Lack of interest in activity or food.  Postpartum psychosis is a very serious condition and can be dangerous. Fortunately, it is rare. Displaying any of the following symptoms is cause for immediate medical attention. Symptoms of  postpartum psychosis include:  Hallucinations and delusions.  Bizarre or disorganized behavior.  Confusion or disorientation.  How is this diagnosed? A diagnosis is made by an evaluation of your symptoms. There are no medical or lab tests that lead to a diagnosis, but there are various questionnaires that a health care provider may use to identify those with the baby blues, postpartum depression, or psychosis. Often, a screening tool called the [place] Postnatal Depression Scale is used to diagnose depression in the postpartum period. How is this treated? The baby blues usually goes away on its own in 1-2 weeks. Social support is often all that is needed. You will be encouraged to get adequate sleep and rest. Occasionally, you may be given medicines to help you sleep. Postpartum depression requires treatment because it can last several months or longer if it is not treated. Treatment may include individual or group therapy, medicine, or both to address any social, physiological, and psychological factors that may play a role in the depression. Regular exercise, a healthy diet, rest, and social support may also be strongly recommended. Postpartum psychosis is more serious and needs treatment right away. Hospitalization is often needed. Follow these instructions at home:  Get as much rest as you can. Nap when the baby sleeps.  Exercise regularly. Some women find yoga and walking to be beneficial.  Eat a balanced and nourishing diet.  Do little things that you enjoy. Have a cup of tea, take a bubble bath, read your favorite magazine, or listen to your favorite music.  Avoid alcohol.  Ask for help with household chores, cooking, grocery shopping, or running errands as needed. Do not try to do everything.  Talk to people close to you about how you are feeling. Get support from your partner, family members, friends, or other new moms.  Try to stay positive in how you think. Think about the  things you are grateful for.  Do not spend a lot of time alone.  Only take over-the-counter or prescription medicine as directed by your health care provider.  Keep all your postpartum appointments.  Let your health care provider know if you have any concerns. Contact a health care provider if: You are having a reaction to or problems with your medicine. Get help right away if:  You have suicidal feelings.  You think you may harm the baby or someone else. This information is not intended to replace advice given to you by your health care provider. Make sure you discuss any questions you have with your health care provider. Document Released: 03/14/2004 Document Revised: 11/16/2015 Document Reviewed: 03/22/2013 Elsevier Interactive Patient Education  2017 [employer]

## 2017-06-20 NOTE — Lactation Note (Signed)
This note was copied from a baby's chart. Lactation Consultation Note  Patient Name: Boy Conley Canalracey Whitehair QMVHQ'IToday's Date: 06/20/2017 Reason for consult: Follow-up assessment;Other (Comment)(sleepy baby)   Maternal Data Formula Feeding for Exclusion: No Has patient been taught Hand Expression?: Yes Does the patient have breastfeeding experience prior to this delivery?: Yes  Feeding Feeding Type: Breast Fed Length of feed: 27 min(both breasts, 20 right breast, 7 min. left) Baby sleepy since yesterday, circ this am , mom unable to get baby awake enough to stay latched and to nurse  LATCH Score Latch: Grasps breast easily, tongue down, lips flanged, rhythmical sucking.  Audible Swallowing: Spontaneous and intermittent  Type of Nipple: Everted at rest and after stimulation  Comfort (Breast/Nipple): Soft / non-tender  Hold (Positioning): Assistance needed to correctly position infant at breast and maintain latch.  LATCH Score: 9  Interventions Interventions: Assisted with latch;Breast compression;Adjust position  Lactation Tools Discussed/Used WIC Program: No   Consult Status Consult Status: PRN Date: 06/20/17 Follow-up type: In-patient    Dyann KiefMarsha D Bakary Bramer 06/20/2017, 2:51 PM

## 2017-06-20 NOTE — Anesthesia Postprocedure Evaluation (Signed)
Anesthesia Post Note  Patient: Diane [name]  Procedure(s) Performed: AN AD HOC LABOR EPIDURAL  Patient location during evaluation: Mother Baby Anesthesia Type: Epidural Level of consciousness: awake and alert and oriented Pain management: pain level controlled Vital Signs Assessment: post-procedure vital signs reviewed and stable Respiratory status: spontaneous breathing and nonlabored ventilation Cardiovascular status: stable Postop Assessment: no headache, no backache, adequate PO intake and no apparent nausea or vomiting Anesthetic complications: no     Last Vitals:  Vitals:   06/20/17 0427 06/20/17 0721  BP: (!) 108/47 113/65  Pulse: 70 83  Resp: 20   Temp: 36.7 C 36.6 C  SpO2:  99%    Last Pain:  Vitals:   06/20/17 0721  TempSrc: Oral  PainSc:                  Zachary GeorgeWeatherly,  Heidee Audi F

## 2017-06-20 NOTE — Progress Notes (Signed)
Patient ID: Boykin Nearingracey A Regal, female   DOB: July 11, 1985, 31 y.o.   MRN: [id number]  Post Partum Day # 1, s/p SVD  Subjective:  Pt sitting in bed, nursing infant. FOB at bedside. No questions or concerns.   Denies difficulty breathing or respiratory distress, chest pain, abdominal pain, excessive vaginal bleeding, dysuria, and leg pain or swelling.   Objective:  Temp:  [97.7 F (36.5 C)-98.2 F (36.8 C)] 98 F (36.7 C) (12/28 0427) Pulse Rate:  [70-87] 70 (12/28 0427) Resp:  [16-20] 20 (12/28 0427) BP: (100-138)/(47-80) 108/47 (12/28 0427) SpO2:  [95 %-99 %] 98 % (12/27 2001)  Physical Exam:   General: alert and cooperative   Lungs: clear to auscultation bilaterally  Breasts: normal appearance, no masses or tenderness  Heart: regular rate and rhythm, S1, S2 normal, no murmur, click, rub or gallop  Abdomen: soft, non-tender; bowel sounds normal; no masses,  no organomegaly  Pelvis: Lochia: appropriate, Uterine  Fundus: firm  Extremities: DVT Evaluation: No evidence of DVT seen on physical exam.  Recent Labs    06/18/17 1539 06/20/17 0423  HGB 12.7 11.7*  HCT 36.8 34.7*    Assessment:    Plan: Discharge home, Breastfeeding and Circumcision prior to discharge   LOS: 2 days    Gunnar BullaJenkins Michelle Ettel Albergo, CNM [facility] 06/20/2017 5:41 AM

## 2017-06-23 ENCOUNTER — Other Ambulatory Visit: Payer: Self-pay | Admitting: Endocrinology

## 2017-06-24 ENCOUNTER — Encounter: Payer: Self-pay | Admitting: Certified Nurse Midwife

## 2017-07-19 ENCOUNTER — Other Ambulatory Visit: Payer: Self-pay | Admitting: Endocrinology

## 2017-07-28 ENCOUNTER — Other Ambulatory Visit: Payer: Self-pay | Admitting: Medical

## 2017-07-28 ENCOUNTER — Ambulatory Visit: Payer: BLUE CROSS/BLUE SHIELD | Admitting: Medical

## 2017-07-28 VITALS — BP 110/70 | HR 91 | Temp 98.8°F | Resp 16 | Wt 188.8 lb

## 2017-07-28 DIAGNOSIS — R1013 Epigastric pain: Secondary | ICD-10-CM

## 2017-07-28 DIAGNOSIS — E559 Vitamin D deficiency, unspecified: Secondary | ICD-10-CM

## 2017-07-28 NOTE — Patient Instructions (Signed)
Seek out  The Emergency department if you have another attack.  Your ultrasound appointment is Friday 8th at 8:10 am.    ow-Fat Diet for Pancreatitis or Gallbladder Conditions A low-fat diet can be helpful if you have pancreatitis or a gallbladder condition. With these conditions, your pancreas and gallbladder have trouble digesting fats. A healthy eating plan with less fat will help rest your pancreas and gallbladder and reduce your symptoms. What do I need to know about this diet?  Eat a low-fat diet. ? Reduce your fat intake to less than 20-30% of your total daily calories. This is less than 50-60 g of fat per day. ? Remember that you need some fat in your diet. Ask your dietician what your daily goal should be. ? Choose nonfat and low-fat healthy foods. Look for the words "nonfat," "low fat," or "fat free." ? As a guide, look on the label and choose foods with less than 3 g of fat per serving. Eat only one serving.  Avoid alcohol.  Do not smoke. If you need help quitting, talk with your health care provider.  Eat small frequent meals instead of three large heavy meals. What foods can I eat? Grains Include healthy grains and starches such as potatoes, wheat bread, fiber-rich cereal, and brown rice. Choose whole grain options whenever possible. In adults, whole grains should account for 45-65% of your daily calories. Fruits and Vegetables Eat plenty of fruits and vegetables. Fresh fruits and vegetables add fiber to your diet. Meats and Other Protein Sources Eat lean meat such as chicken and pork. Trim any fat off of meat before cooking it. Eggs, fish, and beans are other sources of protein. In adults, these foods should account for 10-35% of your daily calories. Dairy Choose low-fat milk and dairy options. Dairy includes fat and protein, as well as calcium. Fats and Oils Limit high-fat foods such as fried foods, sweets, baked goods, sugary drinks. Other Creamy sauces and  condiments, such as mayonnaise, can add extra fat. Think about whether or not you need to use them, or use smaller amounts or low fat options. What foods are not recommended?  High fat foods, such as: ? [employer]. ? Ice cream. ? [place] toast. ? Sweet rolls. ? Pizza. ? Cheese bread. ? Foods covered with batter, butter, creamy sauces, or cheese. ? Fried foods. ? Sugary drinks and desserts.  Foods that cause gas or bloating This information is not intended to replace advice given to you by your health care provider. Make sure you discuss any questions you have with your health care provider. Document Released: 06/15/2013 Document Revised: 11/16/2015 Document Reviewed: 05/24/2013 Elsevier Interactive Patient Education  2017 Elsevier Inc. Gastritis, Adult Gastritis is swelling (inflammation) of the stomach. When you have this condition, you can have these problems (symptoms):  Pain in your stomach.  A burning feeling in your stomach.  Feeling sick to your stomach (nauseous).  Throwing up (vomiting).  Feeling too full after you eat.  It is important to get help for this condition. Without help, your stomach can bleed, and you can get sores (ulcers) in your stomach. Follow these instructions at home:  Take over-the-counter and prescription medicines only as told by your doctor.  If you were prescribed an antibiotic medicine, take it as told by your doctor. Do not stop taking it even if you start to feel better.  Drink enough fluid to keep your pee (urine) clear or pale yellow.  Instead of eating big meals,  eat small meals often. Contact a health care provider if:  Your problems get worse.  Your problems go away and then come back. Get help right away if:  You throw up blood or something that looks like coffee grounds.  You have black or dark red poop (stools).  You cannot keep fluids down.  Your stomach pain gets worse.  You have a fever.  You do not feel better  after 1 week. This information is not intended to replace advice given to you by your health care provider. Make sure you discuss any questions you have with your health care provider. Document Released: 11/27/2007 Document Revised: 02/07/2016 Document Reviewed: 03/04/2015 Elsevier Interactive Patient Education  [employer]2018 Elsevier Inc.

## 2017-07-28 NOTE — Progress Notes (Signed)
   Subjective:    Patient ID: Diane [name], female    DOB: 05/26/1986, 32 y.o.   MRN: [id number]  HPI  32 yo female in non acute distress. With epigastric pain occurring   first episode  3 weeks ago can not recall what she ate.   Occurred last  night epigastric pain radiating into the chest , felt like intense indigestion.  Last night eating pizza and BBQ wings intense pain epigastric radiating into the chest. Sweating  Intensely. Took Kaopectate, gas-x , hard time breathing pain 10/10.  Severe pain lasted 10-15 minutes and then seemed to go down to a level of indigestion 5/`10.  Pain between  Shoulder blades. Took Advil 600 mg at  9am. And  Kaopectate this morning , Pain today was 2/10.  Last ate around 1 pm  peanut butter and jelly sandwich no pain after ingestion. Ate eggs with cheese, feels a heaviness in epigastric area.No right upper quadrant pain.  Drank Milk last night  2 glasses which seemed to help.   Review of Systems  Constitutional: Positive for chills. Negative for fever.  HENT: Negative for congestion, ear discharge and sore throat.   Respiratory: Positive for shortness of breath (during the servere pain). Negative for cough.   Cardiovascular: Positive for chest pain.  Gastrointestinal: Positive for abdominal pain (epigastric radiated into chest upward) and nausea.  Endocrine: Negative for polydipsia, polyphagia and polyuria.  Genitourinary: Negative for dysuria.  Musculoskeletal: Positive for back pain (between the shoulder blades).  Skin: Negative for rash.  Allergic/Immunologic: Negative for food allergies.  Neurological: Negative for dizziness, syncope and light-headedness.  Psychiatric/Behavioral: Negative for behavioral problems, self-injury and suicidal ideas. The patient is not nervous/anxious.    245 week old  Boy  " Theatre managerBennett:" . Cuirrently Breast feeding.   Rapid  30lbs weight loss. Diabetic in 3rd trimester.highest  Weight 207 lbs, currently  189 lbs  now. no pain now. Still felft  History of  Reflux  X 20 years started in Elementary school no history of ulcer.  Had reflux bad with pregnancy. Last endoscope  2009.due to bad flare up of acid reflux. [place] in [place].   Seeinng Diane [name] while  Pregnant and Diane [name] for her hypothyroidism. No primary doctor. Objective:   Physical Exam  Constitutional: She is oriented to person, place, and time. She appears well-developed and well-nourished.  HENT:  Head: Normocephalic and atraumatic.  Eyes: Conjunctivae and EOM are normal.  Cardiovascular: Normal rate, regular rhythm and normal heart sounds.  Pulmonary/Chest: Effort normal and breath sounds normal.  Abdominal: Soft. Bowel sounds are normal. There is tenderness (mild epigastric tenderness with palpation).  Neurological: She is alert and oriented to person, place, and time.  Skin: Skin is warm and dry.  Psychiatric: She has a normal mood and affect. Her behavior is normal. Judgment and thought content normal.  Nursing note and vitals reviewed.    1-2 pain with palpation of the epigastric area,negative Murphy's sign      Assessment & Plan:  Epigastric pain better now.  Stat CBC w/ diff/ MetC / Lipase/ routine Vit D OTC Zantac 75 mg / day. CLD x 24 hours then titrate up to a bland I tolerated. Non fatty / no spicy foods, small meals. LimiIted RUQ ultrasound [facility]

## 2017-07-29 ENCOUNTER — Ambulatory Visit: Payer: BLUE CROSS/BLUE SHIELD | Admitting: Medical

## 2017-07-29 ENCOUNTER — Encounter: Payer: Self-pay | Admitting: Certified Nurse Midwife

## 2017-07-29 ENCOUNTER — Telehealth: Payer: Self-pay | Admitting: Medical

## 2017-07-29 VITALS — BP 100/70 | HR 69 | Temp 97.8°F | Resp 16

## 2017-07-29 DIAGNOSIS — R1013 Epigastric pain: Secondary | ICD-10-CM

## 2017-07-29 DIAGNOSIS — R748 Abnormal levels of other serum enzymes: Secondary | ICD-10-CM

## 2017-07-29 DIAGNOSIS — D72829 Elevated white blood cell count, unspecified: Secondary | ICD-10-CM

## 2017-07-29 LAB — CBC WITH DIFFERENTIAL/PLATELET
BASOS ABS: 0.1 10*3/uL (ref 0.0–0.2)
Basos: 1 %
EOS (ABSOLUTE): 0.1 10*3/uL (ref 0.0–0.4)
Eos: 1 %
HEMOGLOBIN: 13.5 g/dL (ref 11.1–15.9)
Hematocrit: 39.6 % (ref 34.0–46.6)
IMMATURE GRANS (ABS): 0 10*3/uL (ref 0.0–0.1)
Immature Granulocytes: 0 %
LYMPHS: 25 %
Lymphocytes Absolute: 2.2 10*3/uL (ref 0.7–3.1)
MCH: 28.8 pg (ref 26.6–33.0)
MCHC: 34.1 g/dL (ref 31.5–35.7)
MCV: 84 fL (ref 79–97)
MONOCYTES: 12 %
Monocytes Absolute: 1 10*3/uL — ABNORMAL HIGH (ref 0.1–0.9)
NEUTROS PCT: 61 %
Neutrophils Absolute: 5.2 10*3/uL (ref 1.4–7.0)
PLATELETS: 272 10*3/uL (ref 150–379)
RBC: 4.69 x10E6/uL (ref 3.77–5.28)
RDW: 13.7 % (ref 12.3–15.4)
WBC: 8.5 10*3/uL (ref 3.4–10.8)

## 2017-07-29 LAB — COMPREHENSIVE METABOLIC PANEL
ALBUMIN: 4.6 g/dL (ref 3.5–5.5)
ALK PHOS: 165 IU/L — AB (ref 39–117)
ALT: 250 IU/L — ABNORMAL HIGH (ref 0–32)
AST: 145 IU/L — ABNORMAL HIGH (ref 0–40)
Albumin/Globulin Ratio: 1.8 (ref 1.2–2.2)
BILIRUBIN TOTAL: 0.3 mg/dL (ref 0.0–1.2)
BUN / CREAT RATIO: 26 — AB (ref 9–23)
BUN: 18 mg/dL (ref 6–20)
CHLORIDE: 103 mmol/L (ref 96–106)
CO2: 24 mmol/L (ref 20–29)
CREATININE: 0.68 mg/dL (ref 0.57–1.00)
Calcium: 9.3 mg/dL (ref 8.7–10.2)
GFR calc non Af Amer: 116 mL/min/{1.73_m2} (ref >59)
GFR, EST AFRICAN AMERICAN: 134 mL/min/{1.73_m2} (ref >59)
GLUCOSE: 100 mg/dL — AB (ref 65–99)
Globulin, Total: 2.6 g/dL (ref 1.5–4.5)
Potassium: 4.1 mmol/L (ref 3.5–5.2)
Sodium: 145 mmol/L — ABNORMAL HIGH (ref 134–144)
TOTAL PROTEIN: 7.2 g/dL (ref 6.0–8.5)

## 2017-07-29 LAB — LIPASE: LIPASE: 25 U/L (ref 14–72)

## 2017-07-29 LAB — VITAMIN D 25 HYDROXY (VIT D DEFICIENCY, FRACTURES): Vit D, 25-Hydroxy: 25.6 ng/mL — ABNORMAL LOW (ref 30.0–100.0)

## 2017-07-29 NOTE — Telephone Encounter (Signed)
Phone call 2.4.19 8:30 pm   Called patient and reviewed labs with her. Elevated Alk phos, AST, ALT. WBC 12.2  She has had no more pain and was able to eat broth and brown rice tonight.  She is to follow up with me in the morning  8 am. She verbalizes understanding and has no questions at discharge.

## 2017-07-29 NOTE — Progress Notes (Signed)
07/29/17 1600- Pt called with concerns about elevated labs that were discussed this morning at visit. She would like a STAT ultrasound instead of waiting until Friday 08/01/17. [facility]-C, ok to schedule stat. [facility] on [address] For tomorrow 07/30/17 at 0800, NPO after midnight. Pt called and given appt date, time, and NPO instructions. Pt verbalizes understanding and has no further needs or concerns at this time.

## 2017-07-29 NOTE — Progress Notes (Signed)
Reviewed patient with Dr. Sullivan LoneGilbert, possible gallbladder attack even with normal lipase.  Perform US, patient [facility]. If negative begin infectious hepatitis work up.  Patient comes in for recheck this morning. She feels well and is in no pain. No pain after eating broth and brown rice last night for dinner. Will continue to titrate food to bland foods. Gallbladder diet given to patient yesterday.  History She denies any risky sexual behavior only one partner. No IV drug use/ or drug use in general. Denies any transfusions.\ Denies Tylenol use/ or ETOH use ( she is breast feeding).  Physical exam.  Non acute distress Bowel sounds al 4 quadrants within normal limits. No pain with palpation to the RUQ or the Epigastric area today. Improved from yesterday's epigastric tenderness. Other abdominal quadrants, not painful as well.  Plan: History of epigastric pain Elevated liver enzymes Recheck labs on Thursday. Friday U/S limited Right upper quadrant. Due to a history of anxiety recommended meditation and given [employer] information sheet so if she would like to talk to someone and learn coping skills she can contact them. Patient verbalizes understanding and has no questions at discharge.   Patient calls at  4 pm 07/30/27 wanting U/S because she cannot wait that long for the information,  It is causing her to have anxiety.schedule changed for U/S  Scheduled for  8 am 07/30/17 , patient contacted by nurse and informed nothing to eat/drink including water after midnight.

## 2017-07-30 ENCOUNTER — Telehealth: Payer: Self-pay | Admitting: Medical

## 2017-07-30 ENCOUNTER — Ambulatory Visit
Admission: RE | Admit: 2017-07-30 | Discharge: 2017-07-30 | Disposition: A | Discharge status: Home / Self Care | Payer: BLUE CROSS/BLUE SHIELD | Source: Ambulatory Visit | Attending: Medical | Admitting: Medical

## 2017-07-30 DIAGNOSIS — R1013 Epigastric pain: Secondary | ICD-10-CM | POA: Diagnosis not present

## 2017-07-30 DIAGNOSIS — K8020 Calculus of gallbladder without cholecystitis without obstruction: Secondary | ICD-10-CM | POA: Insufficient documentation

## 2017-07-30 HISTORY — DX: Calculus of gallbladder without cholecystitis without obstruction: K80.20

## 2017-07-30 NOTE — Telephone Encounter (Signed)
Called patient , informed her of test results will refer her to General  Surgery. Reviewed with Dr. Sullivan LoneGilbert agrees on plan. Eating bland diet given to patient has not had any more pain.    Ultrasound FINDINGS: Gallbladder:  Cholelithiasis is noted without gallbladder wall thickening or pericholecystic fluid. No sonographic Murphy's sign is noted. Largest calculus measures approximately 5 mm.  Common bile duct:  Diameter: 3 mm which is within normal limits.  Liver:  No focal lesion identified. Within normal limits in parenchymal echogenicity. Portal vein is patent on color Doppler imaging with normal direction of blood flow towards the liver.  IMPRESSION: Cholelithiasis is noted without evidence of inflammation. No other abnormality seen in the right upper quadrant of the abdomen.

## 2017-07-31 ENCOUNTER — Encounter: Payer: Self-pay | Admitting: Certified Nurse Midwife

## 2017-07-31 ENCOUNTER — Other Ambulatory Visit: Payer: BLUE CROSS/BLUE SHIELD

## 2017-07-31 ENCOUNTER — Ambulatory Visit (INDEPENDENT_AMBULATORY_CARE_PROVIDER_SITE_OTHER): Payer: BLUE CROSS/BLUE SHIELD | Admitting: Certified Nurse Midwife

## 2017-07-31 DIAGNOSIS — D72829 Elevated white blood cell count, unspecified: Secondary | ICD-10-CM

## 2017-07-31 DIAGNOSIS — R1013 Epigastric pain: Secondary | ICD-10-CM

## 2017-07-31 DIAGNOSIS — R748 Abnormal levels of other serum enzymes: Secondary | ICD-10-CM

## 2017-07-31 MED ORDER — NORETHINDRONE 0.35 MG PO TABS: 1.0000 | ORAL_TABLET | Freq: Every day | ORAL | 4 refills | Status: DC

## 2017-07-31 NOTE — Patient Instructions (Signed)
Preventive Care 18-39 Years, Female Preventive care refers to lifestyle choices and visits with your health care provider that can promote health and wellness. What does preventive care include?  A yearly physical exam. This is also called an annual well check.  Dental exams once or twice a year.  Routine eye exams. Ask your health care provider how often you should have your eyes checked.  Personal lifestyle choices, including: ? Daily care of your teeth and gums. ? Regular physical activity. ? Eating a healthy diet. ? Avoiding tobacco and drug use. ? Limiting alcohol use. ? Practicing safe sex. ? Taking vitamin and mineral supplements as recommended by your health care provider. What happens during an annual well check? The services and screenings done by your health care provider during your annual well check will depend on your age, overall health, lifestyle risk factors, and family history of disease. Counseling Your health care provider may ask you questions about your:  Alcohol use.  Tobacco use.  Drug use.  Emotional well-being.  Home and relationship well-being.  Sexual activity.  Eating habits.  Work and work Statistician.  Method of birth control.  Menstrual cycle.  Pregnancy history.  Screening You may have the following tests or measurements:  Height, weight, and BMI.  Diabetes screening. This is done by checking your blood sugar (glucose) after you have not eaten for a while (fasting).  Blood pressure.  Lipid and cholesterol levels. These may be checked every 5 years starting at age 66.  Skin check.  Hepatitis C blood test.  Hepatitis B blood test.  Sexually transmitted disease (STD) testing.  BRCA-related cancer screening. This may be done if you have a family history of breast, ovarian, tubal, or peritoneal cancers.  Pelvic exam and Pap test. This may be done every 3 years starting at age 40. Starting at age 59, this may be done every 5  years if you have a Pap test in combination with an HPV test.  Discuss your test results, treatment options, and if necessary, the need for more tests with your health care provider. Vaccines Your health care provider may recommend certain vaccines, such as:  Influenza vaccine. This is recommended every year.  Tetanus, diphtheria, and acellular pertussis (Tdap, Td) vaccine. You may need a Td booster every 10 years.  Varicella vaccine. You may need this if you have not been vaccinated.  HPV vaccine. If you are 69 or younger, you may need three doses over 6 months.  Measles, mumps, and rubella (MMR) vaccine. You may need at least one dose of MMR. You may also need a second dose.  Pneumococcal 13-valent conjugate (PCV13) vaccine. You may need this if you have certain conditions and were not previously vaccinated.  Pneumococcal polysaccharide (PPSV23) vaccine. You may need one or two doses if you smoke cigarettes or if you have certain conditions.  Meningococcal vaccine. One dose is recommended if you are age 27-21 years and a first-year college student living in a residence hall, or if you have one of several medical conditions. You may also need additional booster doses.  Hepatitis A vaccine. You may need this if you have certain conditions or if you travel or work in places where you may be exposed to hepatitis A.  Hepatitis B vaccine. You may need this if you have certain conditions or if you travel or work in places where you may be exposed to hepatitis B.  Haemophilus influenzae type b (Hib) vaccine. You may need this if  you have certain risk factors.  Talk to your health care provider about which screenings and vaccines you need and how often you need them. This information is not intended to replace advice given to you by your health care provider. Make sure you discuss any questions you have with your health care provider. Document Released: 08/06/2001 Document Revised: 02/28/2016  Document Reviewed: 04/11/2015 Elsevier Interactive Patient Education  [employer].

## 2017-07-31 NOTE — Progress Notes (Signed)
PT is doing well PHQ-9=0

## 2017-07-31 NOTE — Progress Notes (Signed)
Subjective:    Diane [name] is a 32 y.o. [id number] Caucasian female who presents for a postpartum visit. She is 6 weeks postpartum following a spontaneous vaginal delivery at 37+6 gestational weeks. Anesthesia: epidural. I have fully reviewed the prenatal and intrapartum course.   Postpartum course has been complicated by gallstones, see chart. Baby's course has been uncomplicated. Baby is feeding by breast. Bleeding no bleeding. Bowel function is normal. Bladder function is normal.   Patient is not sexually active. Last sexual activity: during pregnancy. Contraception method is abstinence. Postpartum depression screening: negative. Score 0.  Last pap 2016 and was negative.  The following portions of the patient's history were reviewed and updated as appropriate: allergies, current medications, past medical history, past surgical history and problem list.  Review of Systems  Pertinent items are noted in HPI.   Objective:   BP 126/81   Pulse 76   Wt 185 lb 12.8 oz (84.3 kg)   Breastfeeding? Yes   BMI 30.92 kg/m   General:  alert, cooperative and no distress   Breasts:  deferred, no complaints  Lungs: clear to auscultation bilaterally  Heart:  regular rate and rhythm  Abdomen: soft, nontender   Vulva: normal  Vagina: normal vagina  Cervix:  closed  Corpus: Well-involuted  Adnexa:  Non-palpable         Depression screen [facility] 07/31/2017  Decreased Interest 0  Down, Depressed, Hopeless 0  PHQ - 2 Score 0  Altered sleeping 0  Tired, decreased energy 0  Change in appetite 0  Feeling bad or failure about yourself  0  Trouble concentrating 0  Moving slowly or fidgety/restless 0  Suicidal thoughts 0  PHQ-9 Score 0   Assessment:   Postpartum exam Six (6) wks s/p vaginal delivery Gallstones Breastfeeding Depression screening Contraception counseling   Plan:   Patient would like referral to GI, see orders. Education regarding "gallbladder diet". Postpartum glucola  deferred at this time.  Reviewed red flag symptoms and when to call.   Rx: Camila, see orders.   Follow up in: 9 months for Annual Exam or earlier if needed.   Gunnar BullaJenkins Michelle Clifton Safley, CNM [facility], [facility]

## 2017-08-01 ENCOUNTER — Other Ambulatory Visit: Payer: BLUE CROSS/BLUE SHIELD

## 2017-08-01 ENCOUNTER — Telehealth: Payer: Self-pay | Admitting: Adult Health

## 2017-08-01 DIAGNOSIS — R7401 Elevation of levels of liver transaminase levels: Secondary | ICD-10-CM

## 2017-08-01 DIAGNOSIS — R740 Nonspecific elevation of levels of transaminase and lactic acid dehydrogenase [LDH]: Principal | ICD-10-CM

## 2017-08-01 LAB — CBC WITH DIFFERENTIAL/PLATELET
BASOS: 1 %
Basophils Absolute: 0 10*3/uL (ref 0.0–0.2)
EOS (ABSOLUTE): 0.1 10*3/uL (ref 0.0–0.4)
Eos: 2 %
HEMOGLOBIN: 12.9 g/dL (ref 11.1–15.9)
Hematocrit: 37.6 % (ref 34.0–46.6)
IMMATURE GRANS (ABS): 0 10*3/uL (ref 0.0–0.1)
Immature Granulocytes: 0 %
LYMPHS ABS: 1.8 10*3/uL (ref 0.7–3.1)
LYMPHS: 27 %
MCH: 28.4 pg (ref 26.6–33.0)
MCHC: 34.3 g/dL (ref 31.5–35.7)
MCV: 83 fL (ref 79–97)
MONOCYTES: 11 %
Monocytes Absolute: 0.7 10*3/uL (ref 0.1–0.9)
NEUTROS ABS: 4 10*3/uL (ref 1.4–7.0)
Neutrophils: 59 %
Platelets: 270 10*3/uL (ref 150–379)
RBC: 4.55 x10E6/uL (ref 3.77–5.28)
RDW: 13.6 % (ref 12.3–15.4)
WBC: 6.7 10*3/uL (ref 3.4–10.8)

## 2017-08-01 LAB — COMPREHENSIVE METABOLIC PANEL
A/G RATIO: 1.8 (ref 1.2–2.2)
ALBUMIN: 4.4 g/dL (ref 3.5–5.5)
ALT: 68 IU/L — ABNORMAL HIGH (ref 0–32)
AST: 18 IU/L (ref 0–40)
Alkaline Phosphatase: 113 IU/L (ref 39–117)
BILIRUBIN TOTAL: 0.4 mg/dL (ref 0.0–1.2)
BUN / CREAT RATIO: 16 (ref 9–23)
BUN: 9 mg/dL (ref 6–20)
CHLORIDE: 104 mmol/L (ref 96–106)
CO2: 21 mmol/L (ref 20–29)
Calcium: 9.2 mg/dL (ref 8.7–10.2)
Creatinine, Ser: 0.57 mg/dL (ref 0.57–1.00)
GFR calc non Af Amer: 123 mL/min/{1.73_m2} (ref >59)
GFR, EST AFRICAN AMERICAN: 142 mL/min/{1.73_m2} (ref >59)
GLOBULIN, TOTAL: 2.5 g/dL (ref 1.5–4.5)
Glucose: 72 mg/dL (ref 65–99)
POTASSIUM: 3.6 mmol/L (ref 3.5–5.2)
SODIUM: 144 mmol/L (ref 134–144)
Total Protein: 6.9 g/dL (ref 6.0–8.5)

## 2017-08-01 LAB — LIPASE: Lipase: 17 U/L (ref 14–72)

## 2017-08-01 NOTE — Telephone Encounter (Signed)
Recent Results (from the past 2160 hour(s))  POCT urinalysis dipstick     Status: None   Collection Time: 05/20/17  8:50 AM  Result Value Ref Range   Color, UA yellow    Clarity, UA cloudy    Glucose, UA negative    Bilirubin, UA negative    Ketones, UA 3+    Spec Grav, UA 1.010 1.010 - 1.025   Blood, UA small    pH, UA 6.0 5.0 - 8.0   Protein, UA negative    Urobilinogen, UA 0.2 0.2 or 1.0 E.U./dL   Nitrite, UA negative    Leukocytes, UA Negative Negative  T4, free     Status: None   Collection Time: 05/21/17  4:34 PM  Result Value Ref Range   Free T4 0.79 0.60 - 1.60 ng/dL    Comment: Specimens from patients who are undergoing biotin therapy and /or ingesting biotin supplements may contain high levels of biotin.  The higher biotin concentration in these specimens interferes with this Free T4 assay.  Specimens that contain high levels  of biotin may cause false high results for this Free T4 assay.  Please interpret results in light of the total clinical presentation of the patient.    TSH     Status: Abnormal   Collection Time: 05/21/17  4:34 PM  Result Value Ref Range   TSH 0.26 (L) 0.35 - 4.50 uIU/mL  POCT urinalysis dipstick     Status: None   Collection Time: 06/03/17  2:30 PM  Result Value Ref Range   Color, UA pale yellow    Clarity, UA clear    Glucose, UA neg    Bilirubin, UA neg    Ketones, UA neg    Spec Grav, UA 1.015 1.010 - 1.025   Blood, UA neg    pH, UA 6.5 5.0 - 8.0   Protein, UA neg    Urobilinogen, UA 0.2 0.2 or 1.0 E.U./dL   Nitrite, UA neg    Leukocytes, UA Negative Negative   Appearance     Odor    Strep Gp B NAA     Status: None   Collection Time: 06/03/17  3:21 PM  Result Value Ref Range   Strep Gp B NAA Negative Negative    Comment: Centers for Disease Control and Prevention (CDC) and American Congress of Obstetricians and Gynecologists (ACOG) guidelines for prevention of perinatal group B streptococcal (GBS) disease specify co-collection  of a vaginal and rectal swab specimen to maximize sensitivity of GBS detection. Per the CDC and ACOG, swabbing both the lower vagina and rectum substantially increases the yield of detection compared with sampling the vagina alone. Penicillin G, ampicillin, or cefazolin are indicated for intrapartum prophylaxis of perinatal GBS colonization. Reflex susceptibility testing should be performed prior to use of clindamycin only on GBS isolates from penicillin-allergic women who are considered a high risk for anaphylaxis. Treatment with vancomycin without additional testing is warranted if resistance to clindamycin is noted.   GC/Chlamydia Probe Amp     Status: None   Collection Time: 06/03/17  3:22 PM  Result Value Ref Range   Chlamydia trachomatis, NAA Negative Negative   Neisseria gonorrhoeae by PCR Negative Negative  POCT urinalysis dipstick     Status: None   Collection Time: 06/12/17 11:17 AM  Result Value Ref Range   Color, UA pale yellow    Clarity, UA clera    Glucose, UA neg    Bilirubin, UA neg  Ketones, UA neg    Spec Grav, UA 1.015 1.010 - 1.025   Blood, UA neg    pH, UA 6.5 5.0 - 8.0   Protein, UA neg    Urobilinogen, UA 0.2 0.2 or 1.0 E.U./dL   Nitrite, UA neg    Leukocytes, UA Negative Negative   Appearance     Odor    Glucose, capillary     Status: None   Collection Time: 06/18/17 12:59 PM  Result Value Ref Range   Glucose-Capillary 88 65 - 99 mg/dL  CBC     Status: None   Collection Time: 06/18/17  3:39 PM  Result Value Ref Range   WBC 8.0 3.6 - 11.0 K/uL   RBC 4.35 3.80 - 5.20 MIL/uL   Hemoglobin 12.7 12.0 - 16.0 g/dL   HCT 16.1 09.6 - 04.5 %   MCV 84.5 80.0 - 100.0 fL   MCH 29.2 26.0 - 34.0 pg   MCHC 34.5 32.0 - 36.0 g/dL   RDW 40.9 81.1 - 91.4 %   Platelets 254 150 - 440 K/uL    Comment: Performed at [facility], [address] [place], [place] [address]  Type and screen [facility]     Status: None   Collection  Time: 06/18/17  3:39 PM  Result Value Ref Range   ABO/RH(D) O POS    Antibody Screen NEG    Sample Expiration      06/21/2017 Performed at [facility], [address] [place], [place] [address]   RPR     Status: None   Collection Time: 06/18/17  3:39 PM  Result Value Ref Range   RPR Ser Ql Non Reactive Non Reactive    Comment: (NOTE) Performed At: [facility] [address] [place], [place] [id number] Jolene Schimke MD [id number] Performed at [facility], [address] [place], [place] [address]   Glucose, capillary     Status: Abnormal   Collection Time: 06/18/17  8:43 PM  Result Value Ref Range   Glucose-Capillary 113 (H) 65 - 99 mg/dL  Glucose, capillary     Status: Abnormal   Collection Time: 06/19/17  9:20 AM  Result Value Ref Range   Glucose-Capillary 119 (H) 65 - 99 mg/dL  Glucose, capillary     Status: Abnormal   Collection Time: 06/19/17 10:42 PM  Result Value Ref Range   Glucose-Capillary 116 (H) 65 - 99 mg/dL  CBC     Status: Abnormal   Collection Time: 06/20/17  4:23 AM  Result Value Ref Range   WBC 12.2 (H) 3.6 - 11.0 K/uL   RBC 4.06 3.80 - 5.20 MIL/uL   Hemoglobin 11.7 (L) 12.0 - 16.0 g/dL   HCT 53.6 (L) 64.4 - 03.4 %   MCV 85.3 80.0 - 100.0 fL   MCH 28.7 26.0 - 34.0 pg   MCHC 33.7 32.0 - 36.0 g/dL   RDW 74.2 (H) 59.5 - 63.8 %   Platelets 214 150 - 440 K/uL    Comment: Performed at [facility], [address] [place], [place] [address]  Glucose, random     Status: None   Collection Time: 06/20/17  4:23 AM  Result Value Ref Range   Glucose, Bld 87 65 - 99 mg/dL    Comment: Performed at [facility], [address] [place], [place] [address]  CBC with Differential/Platelet     Status: Abnormal   Collection Time: 07/28/17  3:27 PM  Result  Value Ref Range   WBC 8.5 3.4 - 10.8 x10E3/uL   RBC 4.69 3.77 - 5.28 x10E6/uL   Hemoglobin 13.5 11.1 - 15.9 g/dL   Hematocrit 16.1 09.6 - 46.6 %   MCV 84  79 - 97 fL   MCH 28.8 26.6 - 33.0 pg   MCHC 34.1 31.5 - 35.7 g/dL   RDW 04.5 40.9 - 81.1 %   Platelets 272 150 - 379 x10E3/uL   Neutrophils 61 Not Estab. %   Lymphs 25 Not Estab. %   Monocytes 12 Not Estab. %   Eos 1 Not Estab. %   Basos 1 Not Estab. %   Neutrophils Absolute 5.2 1.4 - 7.0 x10E3/uL   Lymphocytes Absolute 2.2 0.7 - 3.1 x10E3/uL   Monocytes Absolute 1.0 (H) 0.1 - 0.9 x10E3/uL   EOS (ABSOLUTE) 0.1 0.0 - 0.4 x10E3/uL   Basophils Absolute 0.1 0.0 - 0.2 x10E3/uL   Immature Granulocytes 0 Not Estab. %   Immature Grans (Abs) 0.0 0.0 - 0.1 x10E3/uL  Comprehensive metabolic panel     Status: Abnormal   Collection Time: 07/28/17  3:27 PM  Result Value Ref Range   Glucose 100 (H) 65 - 99 mg/dL   BUN 18 6 - 20 mg/dL   Creatinine, Ser 9.14 0.57 - 1.00 mg/dL   GFR calc non Af Amer 116 >59 mL/min/1.73   GFR calc Af Amer 134 >59 mL/min/1.73   BUN/Creatinine Ratio 26 (H) 9 - 23   Sodium 145 (H) 134 - 144 mmol/L   Potassium 4.1 3.5 - 5.2 mmol/L   Chloride 103 96 - 106 mmol/L   CO2 24 20 - 29 mmol/L   Calcium 9.3 8.7 - 10.2 mg/dL   Total Protein 7.2 6.0 - 8.5 g/dL   Albumin 4.6 3.5 - 5.5 g/dL   Globulin, Total 2.6 1.5 - 4.5 g/dL   Albumin/Globulin Ratio 1.8 1.2 - 2.2   Bilirubin Total 0.3 0.0 - 1.2 mg/dL   Alkaline Phosphatase 165 (H) 39 - 117 IU/L   AST 145 (H) 0 - 40 IU/L   ALT 250 (H) 0 - 32 IU/L  Lipase     Status: None   Collection Time: 07/28/17  3:27 PM  Result Value Ref Range   Lipase 25 14 - 72 U/L  VITAMIN D 25 Hydroxy (Vit-D Deficiency, Fractures)     Status: Abnormal   Collection Time: 07/28/17  3:27 PM  Result Value Ref Range   Vit D, 25-Hydroxy 25.6 (L) 30.0 - 100.0 ng/mL    Comment: Vitamin D deficiency has been defined by the Institute of Medicine and an Endocrine Society practice guideline as a level of serum 25-OH vitamin D less than 20 ng/mL (1,2). The Endocrine Society went on to further define vitamin D insufficiency as a level between 21 and 29  ng/mL (2). 1. IOM (Institute of Medicine). 2010. Dietary reference    intakes for calcium and D. [place]: The    [employer]. 2. Holick MF, [place], Bischoff-Ferrari HA, et al.    Evaluation, treatment, and prevention of vitamin D    deficiency: an Endocrine Society clinical practice    guideline. JCEM. 2011 Jul; 96(7):1911-30.   Lipase     Status: None   Collection Time: 07/31/17  8:10 AM  Result Value Ref Range   Lipase 17 14 - 72 U/L  CBC with Differential/Platelet     Status: None   Collection Time: 07/31/17  8:10 AM  Result Value Ref Range  WBC 6.7 3.4 - 10.8 x10E3/uL   RBC 4.55 3.77 - 5.28 x10E6/uL   Hemoglobin 12.9 11.1 - 15.9 g/dL   Hematocrit 16.1 09.6 - 46.6 %   MCV 83 79 - 97 fL   MCH 28.4 26.6 - 33.0 pg   MCHC 34.3 31.5 - 35.7 g/dL   RDW 04.5 40.9 - 81.1 %   Platelets 270 150 - 379 x10E3/uL   Neutrophils 59 Not Estab. %   Lymphs 27 Not Estab. %   Monocytes 11 Not Estab. %   Eos 2 Not Estab. %   Basos 1 Not Estab. %   Neutrophils Absolute 4.0 1.4 - 7.0 x10E3/uL   Lymphocytes Absolute 1.8 0.7 - 3.1 x10E3/uL   Monocytes Absolute 0.7 0.1 - 0.9 x10E3/uL   EOS (ABSOLUTE) 0.1 0.0 - 0.4 x10E3/uL   Basophils Absolute 0.0 0.0 - 0.2 x10E3/uL   Immature Granulocytes 0 Not Estab. %   Immature Grans (Abs) 0.0 0.0 - 0.1 x10E3/uL  Comprehensive metabolic panel     Status: Abnormal   Collection Time: 07/31/17  8:10 AM  Result Value Ref Range   Glucose 72 65 - 99 mg/dL    Comment: Specimen received in contact with cells. No visible hemolysis present. However GLUC may be decreased and K increased. Clinical correlation indicated.    BUN 9 6 - 20 mg/dL   Creatinine, Ser 9.14 0.57 - 1.00 mg/dL   GFR calc non Af Amer 123 >59 mL/min/1.73   GFR calc Af Amer 142 >59 mL/min/1.73   BUN/Creatinine Ratio 16 9 - 23   Sodium 144 134 - 144 mmol/L   Potassium 3.6 3.5 - 5.2 mmol/L    Comment: Specimen received in contact with cells. No visible hemolysis present.  However GLUC may be decreased and K increased. Clinical correlation indicated.    Chloride 104 96 - 106 mmol/L   CO2 21 20 - 29 mmol/L   Calcium 9.2 8.7 - 10.2 mg/dL   Total Protein 6.9 6.0 - 8.5 g/dL   Albumin 4.4 3.5 - 5.5 g/dL   Globulin, Total 2.5 1.5 - 4.5 g/dL   Albumin/Globulin Ratio 1.8 1.2 - 2.2   Bilirubin Total 0.4 0.0 - 1.2 mg/dL   Alkaline Phosphatase 113 39 - 117 IU/L   AST 18 0 - 40 IU/L   ALT 68 (H) 0 - 32 IU/L   Patient called with for lab results which have improved tremendously. She denies any symptoms at the time of phone call and did see her GYN yesterday and reports her glucose follow up test was put off until she sees Careers adviser. GYN note also shows patient requested GI referral.  08/01/17.  She is advised to return to this office for repeat CMP within two to four weeks to be sure ALT completely resolves as well. She will see surgeon on 08/07/17. She denies any pain or symptoms. She will call for appointment as needed and follow up after surgeon appointment.  She is aware of emergent symptoms and will call 911 or be seen in emergency room if occur.   Orders Placed This Encounter  Procedures  . Comprehensive metabolic panel    Standing Status:   Future    Standing Expiration Date:   08/29/2017  She will call for follow up lab appointment.

## 2017-08-04 ENCOUNTER — Encounter: Payer: Self-pay | Admitting: Surgery

## 2017-08-04 ENCOUNTER — Telehealth: Payer: Self-pay | Admitting: *Deleted

## 2017-08-04 ENCOUNTER — Telehealth: Payer: Self-pay | Admitting: Medical

## 2017-08-04 DIAGNOSIS — R740 Nonspecific elevation of levels of transaminase and lactic acid dehydrogenase [LDH]: Principal | ICD-10-CM

## 2017-08-04 DIAGNOSIS — R7401 Elevation of levels of liver transaminase levels: Secondary | ICD-10-CM

## 2017-08-04 NOTE — Progress Notes (Signed)
Call patient and let her know her liver panel is almost normal. ALT still elevated. Recheck in 2 weeks following her current diet. TY

## 2017-08-04 NOTE — Telephone Encounter (Signed)
Liver enzymes much improved. ALT still elevated at 68. Recheck hepatic panel in 2 weeks. Patient to be following gallbladder diet. Brandy Cozart RN to call pateint and schedule appointment. Will call patient with results.

## 2017-08-07 ENCOUNTER — Encounter: Payer: Self-pay | Admitting: Surgery

## 2017-08-07 ENCOUNTER — Ambulatory Visit: Payer: BLUE CROSS/BLUE SHIELD | Admitting: Surgery

## 2017-08-07 ENCOUNTER — Other Ambulatory Visit: Payer: BLUE CROSS/BLUE SHIELD

## 2017-08-07 VITALS — BP 121/81 | HR 86 | Temp 97.7°F | Ht 65.0 in | Wt 181.8 lb

## 2017-08-07 DIAGNOSIS — K8020 Calculus of gallbladder without cholecystitis without obstruction: Secondary | ICD-10-CM | POA: Diagnosis not present

## 2017-08-07 NOTE — Patient Instructions (Addendum)
You have requested to have your gallbladder removed. This will be done on 08/29/17 at [facility] with Dr.Piscoya  Please pick up Zantac and begin taking it to see if it helps with your symptoms.  Please see your blue pre-care sheet for surgery information.    Low-Fat Diet for Pancreatitis or Gallbladder Conditions   A low-fat diet can be helpful if you have pancreatitis or a gallbladder condition. With these conditions, your pancreas and gallbladder have trouble digesting fats. A healthy eating plan with less fat will help rest your pancreas and gallbladder and reduce your symptoms. What do I need to know about this diet?  Eat a low-fat diet. ? Reduce your fat intake to less than 20-30% of your total daily calories. This is less than 50-60 g of fat per day. ? Remember that you need some fat in your diet. Ask your dietician what your daily goal should be. ? Choose nonfat and low-fat healthy foods. Look for the words "nonfat," "low fat," or "fat free." ? As a guide, look on the label and choose foods with less than 3 g of fat per serving. Eat only one serving.  Avoid alcohol.  Do not smoke. If you need help quitting, talk with your health care provider.  Eat small frequent meals instead of three large heavy meals. What foods can I eat? Grains Include healthy grains and starches such as potatoes, wheat bread, fiber-rich cereal, and brown rice. Choose whole grain options whenever possible. In adults, whole grains should account for 45-65% of your daily calories. Fruits and Vegetables Eat plenty of fruits and vegetables. Fresh fruits and vegetables add fiber to your diet. Meats and Other Protein Sources Eat lean meat such as chicken and pork. Trim any fat off of meat before cooking it. Eggs, fish, and beans are other sources of protein. In adults, these foods should account for 10-35% of your daily calories. Dairy Choose low-fat milk and dairy options. Dairy includes fat and protein, as  well as calcium. Fats and Oils Limit high-fat foods such as fried foods, sweets, baked goods, sugary drinks. Other Creamy sauces and condiments, such as mayonnaise, can add extra fat. Think about whether or not you need to use them, or use smaller amounts or low fat options. What foods are not recommended?  High fat foods, such as: ? [employer]. ? Ice cream. ? [place] toast. ? Sweet rolls. ? Pizza. ? Cheese bread. ? Foods covered with batter, butter, creamy sauces, or cheese. ? Fried foods. ? Sugary drinks and desserts.  Foods that cause gas or bloating This information is not intended to replace advice given to you by your health care provider. Make sure you discuss any questions you have with your health care provider. Document Released: 06/15/2013 Document Revised: 11/16/2015 Document Reviewed: 05/24/2013 Elsevier Interactive Patient Education  2017 [employer]   You will most likely be out of work 1-2 weeks for this surgery. You will return after your post-op appointment with a lifting restriction for approximately 4 more weeks.  You will be able to eat anything you would like to following surgery. But, start by eating a bland diet and advance this as tolerated. The Gallbladder diet is below, please go as closely by this diet as possible prior to surgery to avoid any further attacks.  Please see the (blue)pre-care form that you have been given today. If you have any questions, please call our office.  Laparoscopic Cholecystectomy Laparoscopic cholecystectomy is surgery to remove the gallbladder.  The gallbladder is located in the upper right part of the abdomen, behind the liver. It is a storage sac for bile, which is produced in the liver. Bile aids in the digestion and absorption of fats. Cholecystectomy is often done for inflammation of the gallbladder (cholecystitis). This condition is usually caused by a buildup of gallstones (cholelithiasis) in the gallbladder. Gallstones  can block the flow of bile, and that can result in inflammation and pain. In severe cases, emergency surgery may be required. If emergency surgery is not required, you will have time to prepare for the procedure. Laparoscopic surgery is an alternative to open surgery. Laparoscopic surgery has a shorter recovery time. Your common bile duct may also need to be examined during the procedure. If stones are found in the common bile duct, they may be removed. LET [facility] CARE PROVIDER KNOW ABOUT:  Any allergies you have.  All medicines you are taking, including vitamins, herbs, eye drops, creams, and over-the-counter medicines.  Previous problems you or members of your family have had with the use of anesthetics.  Any blood disorders you have.  Previous surgeries you have had.    Any medical conditions you have. RISKS AND COMPLICATIONS Generally, this is a safe procedure. However, problems may occur, including:  Infection.  Bleeding.  Allergic reactions to medicines.  Damage to other structures or organs.  A stone remaining in the common bile duct.  A bile leak from the cyst duct that is clipped when your gallbladder is removed.  The need to convert to open surgery, which requires a larger incision in the abdomen. This may be necessary if your surgeon thinks that it is not safe to continue with a laparoscopic procedure. BEFORE THE PROCEDURE  Ask your health care provider about:  Changing or stopping your regular medicines. This is especially important if you are taking diabetes medicines or blood thinners.  Taking medicines such as aspirin and ibuprofen. These medicines can thin your blood. Do not take these medicines before your procedure if your health care provider instructs you not to.  Follow instructions from your health care provider about eating or drinking restrictions.  Let your health care provider know if you develop a cold or an infection before surgery.  Plan  to have someone take you home after the procedure.  Ask your health care provider how your surgical site will be marked or identified.  You may be given antibiotic medicine to help prevent infection. PROCEDURE  To reduce your risk of infection:  Your health care team will wash or sanitize their hands.  Your skin will be washed with soap.  An IV tube may be inserted into one of your veins.  You will be given a medicine to make you fall asleep (general anesthetic).  A breathing tube will be placed in your mouth.  The surgeon will make several small cuts (incisions) in your abdomen.  A thin, lighted tube (laparoscope) that has a tiny camera on the end will be inserted through one of the small incisions. The camera on the laparoscope will send a picture to a TV screen (monitor) in the operating room. This will give the surgeon a good view inside your abdomen.  A gas will be pumped into your abdomen. This will expand your abdomen to give the surgeon more room to perform the surgery.  Other tools that are needed for the procedure will be inserted through the other incisions. The gallbladder will be removed through one of the incisions.  After your gallbladder has been removed, the incisions will be closed with stitches (sutures), staples, or skin glue.  Your incisions may be covered with a bandage (dressing). The procedure may vary among health care providers and hospitals. AFTER THE PROCEDURE  Your blood pressure, heart rate, breathing rate, and blood oxygen level will be monitored often until the medicines you were given have worn off.  You will be given medicines as needed to control your pain.   This information is not intended to replace advice given to you by your health care provider. Make sure you discuss any questions you have with your health care provider.   Document Released: 06/10/2005 Document Revised: 03/01/2015 Document Reviewed: 01/20/2013 Elsevier Interactive  Patient Education 2016 [place] Diet for Gallbladder Conditions A low-fat diet can be helpful if you have pancreatitis or a gallbladder condition. With these conditions, your pancreas and gallbladder have trouble digesting fats. A healthy eating plan with less fat will help rest your pancreas and gallbladder and reduce your symptoms. WHAT DO I NEED TO KNOW ABOUT THIS DIET?  Eat a low-fat diet.  Reduce your fat intake to less than 20-30% of your total daily calories. This is less than 50-60 g of fat per day.  Remember that you need some fat in your diet. Ask your dietician what your daily goal should be.  Choose nonfat and low-fat healthy foods. Look for the words "nonfat," "low fat," or "fat free."  As a guide, look on the label and choose foods with less than 3 g of fat per serving. Eat only one serving.  Avoid alcohol.  Do not smoke. If you need help quitting, talk with your health care provider.  Eat small frequent meals instead of three large heavy meals. WHAT FOODS CAN I EAT? Grains Include healthy grains and starches such as potatoes, wheat bread, fiber-rich cereal, and brown rice. Choose whole grain options whenever possible. In adults, whole grains should account for 45-65% of your daily calories.  Fruits and Vegetables Eat plenty of fruits and vegetables. Fresh fruits and vegetables add fiber to your diet. Meats and Other Protein Sources Eat lean meat such as chicken and pork. Trim any fat off of meat before cooking it. Eggs, fish, and beans are other sources of protein. In adults, these foods should account for 10-35% of your daily calories. Dairy Choose low-fat milk and dairy options. Dairy includes fat and protein, as well as calcium.  Fats and Oils Limit high-fat foods such as fried foods, sweets, baked goods, sugary drinks.  Other Creamy sauces and condiments, such as mayonnaise, can add extra fat. Think about whether or not you need to use them, or use  smaller amounts or low fat options. WHAT FOODS ARE NOT RECOMMENDED?  High fat foods, such as:  [employer].  Ice cream.  [place] toast.  Sweet rolls.  Pizza.  Cheese bread.  Foods covered with batter, butter, creamy sauces, or cheese.  Fried foods.  Sugary drinks and desserts.  Foods that cause gas or bloating   This information is not intended to replace advice given to you by your health care provider. Make sure you discuss any questions you have with your health care provider.   Document Released: 06/15/2013 Document Reviewed: 06/15/2013 Elsevier Interactive Patient Education [employer].

## 2017-08-07 NOTE — Progress Notes (Signed)
08/07/2017  Reason for Visit:  Cholelithiasis  Referring Provider:  Ellie Lunch, PA-C  History of Present Illness: Diane [name] is a 32 y.o. female who presents for evaluation of cholelithiasis.  Patient reports that she initially had an episode of epigastric pain in mid-January which was short-lived.  She then was eating pizza and hot wings during [employer] on 2/3 and had a very severe episode of epigastric pain, that nearly doubled her over on the floor.  This resolved on its own, but was severe enough that she went to see her PCP.  Labs and ultrasound were obtained which showed cholelithiasis.  Her LFTs were mildly elevated with AST 145 and ALT 250 and a normal tbili of 0.3, AP 165, and WBC of 8.5.  Due to the LFT elevation, she had repeat labs on 2/7 which showed AST 18, ALT 68, tbili 0.4, AP 113, and WBC 6.7.  Due to the cholelithiasis, she was referred to [place] for further evaluation.  The patient had a recent vaginal delivery and reports that her pain on 2/5 was worse than her pain during delivery.  She also has a history of acid reflux for many years and reports that this pain was not like her reflux pain and it was much more severe.  Denies any fevers, chills, chest pain, or shortness of breath.  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  She has not had any severe episodes of pain since she started a low fat diet.  She reports having acid reflux issues during her pregnancy but no pain like these episodes either.  Past Medical History: Past Medical History:  Diagnosis Date  . Endometriosis   . Gall stones 07/30/2017  . Gestational diabetes mellitus   . Hypothyroidism   . Ovarian cyst   . Stress incontinence 04/16/2017  . Thyroiditis   . 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  LAPAROSCOPY  2009    Home Medications: Prior to Admission medications   Medication Sig Start Date End Date Taking? Authorizing Provider  albuterol (PROAIR HFA) 108 (90 Base) MCG/ACT inhaler Inhale 2 puffs into the lungs every 6 (six) hours as needed for wheezing or shortness of breath. 12/11/16  Yes Shambley, Melody N, CNM  levothyroxine (SYNTHROID, LEVOTHROID) 112 MCG tablet TAKE 1 TABLET(112 MCG) BY MOUTH DAILY BEFORE BREAKFAST 07/19/17  Yes Romero Belling, MD  norethindrone (MICRONOR,CAMILA,ERRIN) 0.35 MG tablet Take 1 tablet (0.35 mg total) by mouth daily. 07/31/17  Yes Lawhorn, Vanessa Blackwell, CNM  Prenatal Vit-Fe Fumarate-FA (PRENATAL MULTIVITAMIN) TABS tablet Take 1 tablet by mouth daily at 12 noon.   Yes [provider]    Allergies: No Known Allergies  Social History:  reports that  has never smoked. she has never used smokeless tobacco. She reports that she does not drink alcohol or use drugs.   Family History: Family History  Problem Relation Age of Onset  . Diabetes Father   . Cancer Maternal Grandmother        colon, breast  . Heart disease Maternal Grandmother   . Heart disease Paternal Grandfather   . Thyroid disease Neg Hx     Review of Systems: Review of Systems  Constitutional: Negative for chills and fever.  HENT: Negative for hearing loss.   Respiratory: Negative  for shortness of breath.   Cardiovascular: Negative for chest pain.  Gastrointestinal: Positive for abdominal pain and heartburn. Negative for constipation, diarrhea, nausea and vomiting.  Genitourinary: Negative for dysuria.  Musculoskeletal: Negative for myalgias.  Skin: Negative for rash.  Neurological: Negative for dizziness.  Psychiatric/Behavioral: Negative for depression.  All other systems reviewed and are negative.   Physical Exam BP 121/81   Pulse 86   Temp 97.7 F (36.5 C) (Oral)   Ht 5\' 5"  (1.651 m)   Wt 82.5 kg (181 lb 12.8 oz)   Breastfeeding? Yes   BMI 30.25 kg/m   CONSTITUTIONAL: No acute distress HEENT:  Normocephalic, atraumatic, extraocular motion intact. NECK: Trachea is midline, and there is no jugular venous distension.  RESPIRATORY:  Lungs are clear, and breath sounds are equal bilaterally. Normal respiratory effort without pathologic use of accessory muscles. CARDIOVASCULAR: Heart is regular without murmurs, gallops, or rubs. GI: The abdomen is soft, nondistended, nontender to palpation.  Negative Murphy's sign. There were no palpable masses.  MUSCULOSKELETAL:  Normal muscle strength and tone in all four extremities.  No peripheral edema or cyanosis. SKIN: Skin turgor is normal. There are no pathologic skin lesions.  NEUROLOGIC:  Motor and sensation is grossly normal.  Cranial nerves are grossly intact. PSYCH:  Alert and oriented to person, place and time. Affect is normal.  Laboratory Analysis: As mentioned in HPI.  Imaging: U/S 07/30/17: IMPRESSION: Cholelithiasis is noted without evidence of inflammation. No other abnormality seen in the right upper quadrant of the abdomen.  Assessment and Plan: This is a 32 y.o. female who presents with symptomatic cholelithiasis.  I have independently viewed the patient's imaging study and reviewed her laboratory studies.  Overall her ultrasound shows cholelithiasis without any wall thickening or pericholecystic fluid.  Her labs have normalized as well compare to the initial ones.  Discussed with the patient that some of her symptoms could be related to GERD though the patient does report that her symptoms are not the same and her pain felt very different and was more severe.  In light of this, will schedule her for surgery.  She will be scheduled for 08/29/17.  In the meantime, have suggested to start Zantac 150 mg BID to see how her symptoms progress.  She should also maintain a low fat diet.  Discussed the role for laparoscopic cholecystectomy and the risks of bleeding, infection, and injury to surrounding  structures and the possibility to convert to an open procedure.  She is willing to proceed.  Face-to-face time spent with the patient and care providers was 60 minutes, with more than 50% of the time spent counseling, educating, and coordinating care of the patient.     Howie IllJose Luis Mikaila Grunert, MD [facility]

## 2017-08-12 ENCOUNTER — Telehealth: Payer: Self-pay | Admitting: Surgery

## 2017-08-12 NOTE — Telephone Encounter (Signed)
Patient not available. She stated that she will call back to obtain information below later today.   pre op date/time and sx date. Sx: 08/29/17 with Dr Piscoya--laparoscopic cholecystectomy.  Pre op: 08/22/17 between 9-1:00pm--phone interview.   Patient made aware to call [phone], between 1-3:00pm the day before surgery, to find out what time to arrive.

## 2017-08-13 NOTE — Telephone Encounter (Signed)
I contacted patient to go over surgery information. Patient has decided at this time she would like to cancel surgery and try the Zantac 150 mg BID for a while to see if her symptoms could improve.   I have advised Dr Aleen CampiPiscoya of this and he would like to refer the patient to [facility] for possible GERD. Patient agreed.   I have advised her that if her symptoms did not improve and in the future she would like to have surgery, to please call and set up another consultation.  I have notified [facility] OR and pre admissions.

## 2017-08-21 ENCOUNTER — Ambulatory Visit: Payer: BLUE CROSS/BLUE SHIELD | Admitting: Endocrinology

## 2017-08-22 ENCOUNTER — Other Ambulatory Visit: Payer: BLUE CROSS/BLUE SHIELD

## 2017-08-25 ENCOUNTER — Ambulatory Visit (INDEPENDENT_AMBULATORY_CARE_PROVIDER_SITE_OTHER): Payer: BLUE CROSS/BLUE SHIELD | Admitting: Endocrinology

## 2017-08-25 ENCOUNTER — Encounter: Payer: Self-pay | Admitting: Endocrinology

## 2017-08-25 VITALS — BP 122/68 | HR 87 | Temp 98.4°F | Ht 65.0 in | Wt 178.6 lb

## 2017-08-25 DIAGNOSIS — E061 Subacute thyroiditis: Secondary | ICD-10-CM

## 2017-08-25 LAB — TSH: TSH: 0.03 u[IU]/mL — AB (ref 0.35–4.50)

## 2017-08-25 LAB — T4, FREE: FREE T4: 0.98 ng/dL (ref 0.60–1.60)

## 2017-08-25 MED ORDER — LEVOTHYROXINE SODIUM 75 MCG PO TABS: 75.0000 ug | ORAL_TABLET | Freq: Every day | ORAL | 2 refills | Status: DC

## 2017-08-25 NOTE — Patient Instructions (Signed)
blood tests are requested for you today.  We'll let you know about the results.  Please come back for a follow-up appointment in 6 months.   We can check the blood tests in between if you want.

## 2017-08-25 NOTE — Progress Notes (Signed)
Subjective:    Patient ID: Diane [name], female    DOB: Oct 05, 1985, 32 y.o.   MRN: [id number]  HPI Pt returns for f/u of subacute thyroiditis (dx'ed mid-2016, when she presented with hyprethyroidism; she has never had thyroid imaging; she had a child in early 2016; she was rx'ed synthroid in late 2016).  pt states she feels well in general, except for GERD sxs.  She is now 2 1/2 mos postpartum.  She is now well below pre-pregnancy weight.   Past Medical History:  Diagnosis Date  . Endometriosis   . Gall stones 07/30/2017  . Gestational diabetes mellitus   . Hypothyroidism   . Ovarian cyst   . Stress incontinence 04/16/2017  . Thyroiditis   . 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 LAPAROSCOPY  2009    Social History   Socioeconomic History  . Marital status: Married    Spouse name: Not on file  . Number of children: Not on file  . Years of education: Not on file  . Highest education level: Not on file  Social Needs  . Financial resource strain: Not on file  . Food insecurity - worry: Not on file  . Food insecurity - inability: Not on file  . Transportation needs - medical: Not on file  . Transportation needs - non-medical: Not on file  Occupational History  . Not on file  Tobacco Use  . Smoking status: Never Smoker  . Smokeless tobacco: Never Used  Substance and Sexual Activity  . Alcohol use: No  . Drug use: No  . Sexual activity: No    Birth control/protection: Pill  Other Topics Concern  . Not on file  Social History Narrative  . Not on file    Current Outpatient Medications on File Prior to Visit  Medication Sig Dispense Refill  . albuterol (PROAIR HFA) 108 (90 Base) MCG/ACT inhaler Inhale 2 puffs into the lungs every 6 (six) hours as needed for wheezing or shortness of breath. 1 Inhaler 2  . norethindrone (MICRONOR,CAMILA,ERRIN) 0.35 MG tablet Take 1 tablet (0.35 mg total) by mouth daily. 3 Package 4    . Prenatal Vit-Fe Fumarate-FA (PRENATAL MULTIVITAMIN) TABS tablet Take 1 tablet by mouth daily at 12 noon.    . ranitidine (ZANTAC) 150 MG capsule Take 150 mg by mouth 2 (two) times daily.     No current facility-administered medications on file prior to visit.     No Known Allergies  Family History  Problem Relation Age of Onset  . Diabetes Father   . Cancer Maternal Grandmother        colon, breast  . Heart disease Maternal Grandmother   . Heart disease Paternal Grandfather   . Thyroid disease Neg Hx     BP 122/68 (BP Location: Left Arm, Patient Position: Sitting, Cuff Size: Normal)   Pulse 87   Temp 98.4 F (36.9 C) (Oral)   Ht 5\' 5"  (1.651 m)   Wt 178 lb 9.6 oz (81 kg)   SpO2 98%   BMI 29.72 kg/m    Review of Systems Denies tremor    Objective:   Physical Exam VITAL SIGNS:  See vs page GENERAL: no distress NECK: There is no palpable thyroid enlargement.  No thyroid nodule is palpable.  No palpable lymphadenopathy at the anterior neck.   Lab Results  Component Value Date   TSH 0.03 (L) 08/25/2017   T4TOTAL 13.2 (H) 01/11/2016  Assessment & Plan:  Hypothyroidism: overreplaced.  I have sent a prescription to your pharmacy, to reduce

## 2017-08-29 ENCOUNTER — Ambulatory Visit: Admit: 2017-08-29 | Payer: BLUE CROSS/BLUE SHIELD | Admitting: Surgery

## 2017-08-29 SURGERY — LAPAROSCOPIC CHOLECYSTECTOMY
Anesthesia: General

## 2017-09-02 ENCOUNTER — Encounter: Payer: Self-pay | Admitting: Gastroenterology

## 2017-09-02 ENCOUNTER — Ambulatory Visit: Payer: BLUE CROSS/BLUE SHIELD | Admitting: Gastroenterology

## 2017-09-02 VITALS — BP 127/87 | HR 76 | Ht 65.0 in | Wt 179.2 lb

## 2017-09-02 DIAGNOSIS — K219 Gastro-esophageal reflux disease without esophagitis: Secondary | ICD-10-CM | POA: Diagnosis not present

## 2017-09-02 DIAGNOSIS — K8020 Calculus of gallbladder without cholecystitis without obstruction: Secondary | ICD-10-CM | POA: Diagnosis not present

## 2017-09-02 NOTE — Progress Notes (Signed)
Jonathon Bellows MD, MRCP(U.K) [address]  [place]  [place], [place] [address]  Main: [phone]  Fax: [phone]   Gastroenterology Consultation  Referring Provider:     Olean Ree, MD Primary Care Physician:  Patient, No Pcp Per Primary Gastroenterologist:  Dr. Jonathon Bellows  Reason for Consultation:     GERD        HPI:   GLENDY BARSANTI is a 32 y.o. y/o female . She has been referred for GERD. She was seen by Dr Hampton Abbot on 08/07/17 for evaluation of chololithiasis. She had epigastric pain in 06/2017 while eating pizza and wings . Because the pain was severe she went and was seen by her PCP who checked her LFT's and noted elevated transaminases , ultrasound showed gall stones,CBD 3 mm . She subsequently had a repeat LFT's in 07/2017 and it showed resolution of her LFT's . She had a discussion about cholecystectomy and initially was scheduled but later cancelled.     Hepatic Function Latest Ref Rng & Units 07/31/2017 07/28/2017 01/11/2016  Total Protein 6.0 - 8.5 g/dL 6.9 7.2 7.0  Albumin 3.5 - 5.5 g/dL 4.4 4.6 4.5  AST 0 - 40 IU/L 18 145(H) 9  ALT 0 - 32 IU/L 68(H) 250(H) 8  Alk Phosphatase 39 - 117 IU/L 113 165(H) 62  Total Bilirubin 0.0 - 1.2 mg/dL 0.4 0.3 0.2   She recalls on that day in 06/2017 she had severe abdominal pain when she was rolling on the floor, epigastrium , no similar pains in the past. Reslved rapidly over 30 mins. No similar episodes in the past. Cameron mother had her gall bladder taken out. She is on the oral contraceptive pill. H/o GERD since younger years but pain is different in nature. At that time she was past partum.   Past Medical History:  Diagnosis Date  . Endometriosis   . Gall stones 07/30/2017  . Gestational diabetes mellitus   . Hypothyroidism   . Ovarian cyst   . Stress incontinence 04/16/2017  . Thyroiditis   . 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 LAPAROSCOPY  2009    Prior to  Admission medications   Medication Sig Start Date End Date Taking? Authorizing Provider  albuterol (PROAIR HFA) 108 (90 Base) MCG/ACT inhaler Inhale 2 puffs into the lungs every 6 (six) hours as needed for wheezing or shortness of breath. 12/11/16   Shambley, Melody N, CNM  levothyroxine (SYNTHROID, LEVOTHROID) 75 MCG tablet Take 1 tablet (75 mcg total) by mouth daily before breakfast. 08/25/17   Renato Shin, MD  norethindrone (MICRONOR,CAMILA,ERRIN) 0.35 MG tablet Take 1 tablet (0.35 mg total) by mouth daily. 07/31/17   Lawhorn, Lara Mulch, CNM  Prenatal Vit-Fe Fumarate-FA (PRENATAL MULTIVITAMIN) TABS tablet Take 1 tablet by mouth daily at 12 noon.    [provider]  ranitidine (ZANTAC) 150 MG capsule Take 150 mg by mouth 2 (two) times daily.    [provider]    Family History  Problem Relation Age of Onset  . Diabetes Father   . Cancer Maternal Grandmother        colon, breast  . Heart disease Maternal Grandmother   . Heart disease Paternal Grandfather   . Thyroid disease Neg Hx      Social History   Tobacco Use  . Smoking status: Never Smoker  . Smokeless tobacco: Never Used  Substance Use Topics  . Alcohol use: No  . Drug  use: No    Allergies as of 09/02/2017  . (No Known Allergies)    Review of Systems:    All systems reviewed and negative except where noted in HPI.   Physical Exam:  There were no vitals taken for this visit. No LMP recorded. Psych:  Alert and cooperative. Normal mood and affect. General:   Alert,  Well-developed, well-nourished, pleasant and cooperative in NAD Head:  Normocephalic and atraumatic. Eyes:  Sclera clear, no icterus.   Conjunctiva pink. Ears:  Normal auditory acuity. Nose:  No deformity, discharge, or lesions. Mouth:  No deformity or lesions,oropharynx pink & moist. Neck:  Supple; no masses or thyromegaly. Lungs:  Respirations even and unlabored.  Clear throughout to auscultation.   No wheezes, crackles, or  rhonchi. No acute distress. Heart:  Regular rate and rhythm; no murmurs, clicks, rubs, or gallops. Abdomen:  Normal bowel sounds.  No bruits.  Soft, non-tender and non-distended without masses, hepatosplenomegaly or hernias noted.  No guarding or rebound tenderness.    Neurologic:  Alert and oriented x3;  grossly normal neurologically. Skin:  Intact without significant lesions or rashes. No jaundice. Lymph Nodes:  No significant cervical adenopathy. Psych:  Alert and cooperative. Normal mood and affect.  Imaging Studies: No results found.  Assessment and Plan:   JADALYN OLIVERI is a 32 y.o. y/o female has been referred for GERD . Review of her history , elevated liver transaminases that were transient, being post partum , on the hormonal contraceptive. My impression is that she likely passed a gall stone and the severity of the pain she describes would not be seen in GERD and more likely biliary. Explained based on my assessment , suggest her to have her gall bladder taken out due to the risk of future pancreatitis. Explained there is no way to be certain that this is from her gall bladder or not. The assessment has been made based on her history and labs tests with gall stones seen on her ultrasound.   Plan  1. She requested another opinion- she would like [place] to refer to a surgeon  2. Suggested to continue Zantac BID 3. Consider non hormonal options for contraception  4. F/u with me in 4 weeks to revaluate options    Follow up in 4 weeks  Dr Jonathon Bellows MD,MRCP(U.K)

## 2017-09-03 ENCOUNTER — Encounter: Payer: Self-pay | Admitting: Obstetrics and Gynecology

## 2017-09-10 DIAGNOSIS — K811 Chronic cholecystitis: Secondary | ICD-10-CM | POA: Diagnosis not present

## 2017-09-23 ENCOUNTER — Encounter: Payer: Self-pay | Admitting: Endocrinology

## 2017-09-24 ENCOUNTER — Other Ambulatory Visit: Payer: Self-pay | Admitting: Endocrinology

## 2017-09-24 ENCOUNTER — Other Ambulatory Visit (INDEPENDENT_AMBULATORY_CARE_PROVIDER_SITE_OTHER): Payer: BLUE CROSS/BLUE SHIELD

## 2017-09-24 DIAGNOSIS — E061 Subacute thyroiditis: Secondary | ICD-10-CM

## 2017-09-24 LAB — T4, FREE: FREE T4: 0.88 ng/dL (ref 0.60–1.60)

## 2017-09-24 LAB — TSH: TSH: 0.21 u[IU]/mL — ABNORMAL LOW (ref 0.35–4.50)

## 2017-09-24 MED ORDER — LEVOTHYROXINE SODIUM 50 MCG PO TABS: 50.0000 ug | ORAL_TABLET | Freq: Every day | ORAL | 3 refills | Status: DC

## 2017-09-30 ENCOUNTER — Ambulatory Visit: Payer: BLUE CROSS/BLUE SHIELD | Admitting: Gastroenterology

## 2017-09-30 ENCOUNTER — Encounter: Payer: Self-pay | Admitting: Gastroenterology

## 2017-09-30 VITALS — BP 116/81 | HR 65 | Ht 65.0 in | Wt 168.2 lb

## 2017-09-30 DIAGNOSIS — K219 Gastro-esophageal reflux disease without esophagitis: Secondary | ICD-10-CM | POA: Diagnosis not present

## 2017-09-30 DIAGNOSIS — R945 Abnormal results of liver function studies: Secondary | ICD-10-CM

## 2017-09-30 DIAGNOSIS — R7989 Other specified abnormal findings of blood chemistry: Secondary | ICD-10-CM

## 2017-09-30 DIAGNOSIS — K8050 Calculus of bile duct without cholangitis or cholecystitis without obstruction: Secondary | ICD-10-CM | POA: Diagnosis not present

## 2017-09-30 NOTE — Progress Notes (Signed)
Wyline MoodKiran Raine Blodgett MD, MRCP(U.K) [address]  [address]  [place], [place] [address]  Main: [phone]  Fax: [phone]   Primary Care Physician: Patient, No Pcp Per  Primary Gastroenterologist:  Dr. Wyline MoodKiran Charmayne Odell   No chief complaint on file.   HPI: Diane [name] is a 32 y.o. female    Summary of history :  She was initially referred for GERD . She was seen by Dr Aleen CampiPiscoya on 08/07/17 for evaluation of chololithiasis. She had epigastric pain in 06/2017 while eating pizza and wings . Because the pain was severe she went and was seen by her PCP who checked her LFT's and noted elevated transaminases , ultrasound showed gall stones,CBD 3 mm . She subsequently had a repeat LFT's in 07/2017 and it showed resolution of her LFT's . She had a discussion about cholecystectomy and initially was scheduled but later cancelled.   Interval history   09/02/2017-  09/30/2017  At her last visit she requested a third opinion and was seen by [facility] Daphine DeutscherMartin at [facility]. She is scheduled for surgery June 2019 . Since last visit no issues.    Current Outpatient Medications  Medication Sig Dispense Refill  . albuterol (PROAIR HFA) 108 (90 Base) MCG/ACT inhaler Inhale 2 puffs into the lungs every 6 (six) hours as needed for wheezing or shortness of breath. 1 Inhaler 2  . levothyroxine (SYNTHROID, LEVOTHROID) 50 MCG tablet Take 1 tablet (50 mcg total) by mouth daily before breakfast. 90 tablet 3  . norethindrone (MICRONOR,CAMILA,ERRIN) 0.35 MG tablet Take 1 tablet (0.35 mg total) by mouth daily. 3 Package 4  . Prenatal Vit-Fe Fumarate-FA (PRENATAL MULTIVITAMIN) TABS tablet Take 1 tablet by mouth daily at 12 noon.    . ranitidine (ZANTAC) 150 MG capsule Take 150 mg by mouth 2 (two) times daily.     No current facility-administered medications for this visit.     Allergies as of 09/30/2017  . (No Known Allergies)    ROS:  General: Negative for anorexia, weight loss, fever, chills, fatigue,  weakness. ENT: Negative for hoarseness, difficulty swallowing , nasal congestion. CV: Negative for chest pain, angina, palpitations, dyspnea on exertion, peripheral edema.  Respiratory: Negative for dyspnea at rest, dyspnea on exertion, cough, sputum, wheezing.  GI: See history of present illness. GU:  Negative for dysuria, hematuria, urinary incontinence, urinary frequency, nocturnal urination.  Endo: Negative for unusual weight change.    Physical Examination:   There were no vitals taken for this visit.  General: Well-nourished, well-developed in no acute distress.  Eyes: No icterus. Conjunctivae pink. Mouth: Oropharyngeal mucosa moist and pink , no lesions erythema or exudate. Lungs: Clear to auscultation bilaterally. Non-labored. Heart: Regular rate and rhythm, no murmurs rubs or gallops.  Abdomen: Bowel sounds are normal, nontender, nondistended, no hepatosplenomegaly or masses, no abdominal bruits or hernia , no rebound or guarding.   Extremities: No lower extremity edema. No clubbing or deformities. Neuro: Alert and oriented x 3.  Grossly intact. Skin: Warm and dry, no jaundice.   Psych: Alert and cooperative, normal mood and affect.   Imaging Studies: No results found.  Assessment and Plan:   Diane [name] is a 32 y.o. y/o female  Here to follow up for GERD and biliary colic.   Plan  1. Scheduled for surgery for cholecystectomy  2. Continue Zantac for GERD and if still no better try Prilosec 40 mg once a day  3. Repeat LFT's   Dr Wyline MoodKiran Mylez Venable  MD,MRCP [facility](U.K) Follow  up in 4 months

## 2017-10-23 ENCOUNTER — Other Ambulatory Visit: Payer: Self-pay

## 2017-10-23 DIAGNOSIS — R945 Abnormal results of liver function studies: Secondary | ICD-10-CM

## 2017-10-24 ENCOUNTER — Encounter: Payer: Self-pay | Admitting: Adult Health

## 2017-10-24 ENCOUNTER — Ambulatory Visit: Payer: BLUE CROSS/BLUE SHIELD | Admitting: Adult Health

## 2017-10-24 ENCOUNTER — Other Ambulatory Visit: Payer: BLUE CROSS/BLUE SHIELD

## 2017-10-24 VITALS — BP 109/63 | HR 72 | Temp 98.1°F | Resp 16 | Ht 65.0 in | Wt 165.0 lb

## 2017-10-24 DIAGNOSIS — R945 Abnormal results of liver function studies: Secondary | ICD-10-CM

## 2017-10-24 DIAGNOSIS — R740 Nonspecific elevation of levels of transaminase and lactic acid dehydrogenase [LDH]: Principal | ICD-10-CM

## 2017-10-24 DIAGNOSIS — H6690 Otitis media, unspecified, unspecified ear: Secondary | ICD-10-CM

## 2017-10-24 DIAGNOSIS — R7401 Elevation of levels of liver transaminase levels: Secondary | ICD-10-CM

## 2017-10-24 MED ORDER — AMOXICILLIN 875 MG PO TABS: 875.0000 mg | ORAL_TABLET | Freq: Two times a day (BID) | ORAL | 0 refills | Status: DC

## 2017-10-24 NOTE — Progress Notes (Signed)
Subjective:     Patient ID: Diane [name], female   DOB: 1986-05-16, 32 y.o.   MRN: [id number]  HPI   Patient is a 33 year old female in no acute distress who comes to the clinic with complaint of ear ache in her right ear since yesterday. Mild allergy symptoms. She has runny nose occasionally. Denies any ear discharge.  Patient  denies any fever, body aches,chills, rash, chest pain, shortness of breath, nausea, vomiting, or diarrhea.   She is scheduled for gallbladder removal on June 7th with Luretha Murphy. She reports her Gastrointestinal symptoms are stable- she has had follow up with surgeon and Gastrointestinal MD.  Patient Active Problem List   Diagnosis Date Noted  . Symptomatic cholelithiasis 08/07/2017  . Stress incontinence 04/16/2017  . Thyroiditis, 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    Current Outpatient Medications:  .  albuterol (PROAIR HFA) 108 (90 Base) MCG/ACT inhaler, Inhale 2 puffs into the lungs every 6 (six) hours as needed for wheezing or shortness of breath., Disp: 1 Inhaler, Rfl: 2 .  levothyroxine (SYNTHROID, LEVOTHROID) 50 MCG tablet, Take 1 tablet (50 mcg total) by mouth daily before breakfast., Disp: 90 tablet, Rfl: 3 .  norethindrone (MICRONOR,CAMILA,ERRIN) 0.35 MG tablet, Take 1 tablet (0.35 mg total) by mouth daily., Disp: 3 Package, Rfl: 4 .  Prenatal Vit-Fe Fumarate-FA (PRENATAL MULTIVITAMIN) TABS tablet, Take 1 tablet by mouth daily at 12 noon., Disp: , Rfl:  .  ranitidine (ZANTAC) 150 MG capsule, Take 150 mg by mouth 2 (two) times daily., Disp: , Rfl:   Review of Systems  Constitutional: Negative.  Negative for activity change, appetite change, chills, diaphoresis, fatigue, fever and unexpected weight change.  HENT: Positive for ear pain and rhinorrhea. Negative for congestion, dental problem, drooling, ear discharge, facial swelling, hearing loss, mouth sores, nosebleeds, postnasal drip, sinus pressure, sinus pain, sneezing, sore throat, tinnitus, trouble  swallowing and voice change.   Eyes: Negative for photophobia, pain, discharge, redness, itching and visual disturbance.  Respiratory: Negative.  Negative for apnea, cough, choking, chest tightness, shortness of breath, wheezing and stridor.   Cardiovascular: Negative.  Negative for chest pain, palpitations and leg swelling.  Gastrointestinal: Negative for abdominal distention, abdominal pain, anal bleeding, blood in stool, constipation, diarrhea, nausea, rectal pain and vomiting.       None today- having gallbladder removal June 2019 with [facility].   Endocrine: Negative for cold intolerance, heat intolerance, polydipsia, polyphagia and polyuria.  Genitourinary: Negative for decreased urine volume, difficulty urinating, dyspareunia, dysuria, enuresis, flank pain, frequency, genital sores, hematuria, menstrual problem, pelvic pain, urgency, vaginal bleeding, vaginal discharge and vaginal pain.  Musculoskeletal: Negative for arthralgias, back pain, gait problem, joint swelling, myalgias, neck pain and neck stiffness.  Skin: Negative for color change, pallor, rash and wound.  Allergic/Immunologic: Negative for environmental allergies, food allergies and immunocompromised state.  Neurological: Negative for dizziness, tremors, seizures, syncope, facial asymmetry, speech difficulty, weakness, light-headedness, numbness and headaches.  Hematological: Negative for adenopathy. Does not bruise/bleed easily.  Psychiatric/Behavioral: Negative for agitation, behavioral problems, confusion, decreased concentration, dysphoric mood, hallucinations, self-injury, sleep disturbance and suicidal ideas. The patient is not nervous/anxious and is not hyperactive.        Objective:   Physical Exam  Constitutional: She is oriented to person, place, and time. Vital signs are normal. She appears well-developed and well-nourished. She is active.  Non-toxic appearance. She does not have a sickly appearance.  She does not appear ill. No distress.  Patient moves on and off of  exam table and in room without difficulty. Gait is normal in hall and in room. Patient is oriented to person place time and situation. Patient answers questions appropriately and engages in conversation.  Patient is alert and oriented and responsive to questions Engages in eye contact with provider. Speaks in full sentences without any pauses without any shortness of breath.    HENT:  Head: Normocephalic and atraumatic.  Right Ear: Hearing, external ear and ear canal normal. No tenderness. Tympanic membrane is erythematous. Tympanic membrane is not perforated. A middle ear effusion (darker yellow/ bropwn fluid behind membrane ) is present.  Left Ear: Hearing, tympanic membrane, external ear and ear canal normal. Tympanic membrane is not perforated and not erythematous.  No middle ear effusion (clear fluid behind membrane ).  Nose: Nose normal.  Mouth/Throat: Oropharynx is clear and moist. No oropharyngeal exudate.  Eyes: Pupils are equal, round, and reactive to light. Conjunctivae and EOM are normal. Right eye exhibits no discharge. Left eye exhibits no discharge. No scleral icterus.  Neck: Normal range of motion. Neck supple. No JVD present. No tracheal deviation present.  Cardiovascular: Normal rate, regular rhythm, normal heart sounds and intact distal pulses. Exam reveals no gallop and no friction rub.  No murmur heard. Pulmonary/Chest: Effort normal and breath sounds normal. No stridor. No respiratory distress. She has no wheezes. She has no rales. She exhibits no tenderness.  Abdominal: Soft. Bowel sounds are normal.  Musculoskeletal: Normal range of motion.  Lymphadenopathy:    She has no cervical adenopathy.  Neurological: She is alert and oriented to person, place, and time. She displays normal reflexes. No cranial nerve deficit. She exhibits normal muscle tone. Coordination normal.  Skin: Skin is warm and dry. Capillary  refill takes less than 2 seconds. No rash noted. She is not diaphoretic. No erythema. No pallor.  Psychiatric: She has a normal mood and affect. Her behavior is normal. Judgment and thought content normal.  Vitals reviewed.      Assessment:     Acute otitis media, unspecified otitis media type      Plan:     Meds ordered this encounter  Medications  . amoxicillin (AMOXIL) 875 MG tablet    Sig: Take 1 tablet (875 mg total) by mouth 2 (two) times daily.    Dispense:  20 tablet    Refill:  0    Advised patient call the office or your primary care doctor for an appointment if no improvement within 72 hours or if any symptoms change or worsen at any time  Advised ER or urgent Care if after hours or on weekend. Call 911 for emergency symptoms at any time.Patinet verbalized understanding of all instructions given/reviewed and treatment plan and has no further questions or concerns at this time.    Patient verbalized understanding of all instructions given and denies any further questions at this time.

## 2017-10-24 NOTE — Patient Instructions (Signed)
Otitis Media, Adult Otitis media is redness, soreness, and puffiness (swelling) in the space just behind your eardrum (middle ear). It may be caused by allergies or infection. It often happens along with a cold. Follow these instructions at home:  Take your medicine as told. Finish it even if you start to feel better.  Only take over-the-counter or prescription medicines for pain, discomfort, or fever as told by your doctor.  Follow up with your doctor as told. Contact a doctor if:  You have otitis media only in one ear, or bleeding from your nose, or both.  You notice a lump on your neck.  You are not getting better in 3-5 days.  You feel worse instead of better. Get help right away if:  You have pain that is not helped with medicine.  You have puffiness, redness, or pain around your ear.  You get a stiff neck.  You cannot move part of your face (paralysis).  You notice that the bone behind your ear hurts when you touch it. This information is not intended to replace advice given to you by your health care provider. Make sure you discuss any questions you have with your health care provider. Document Released: 11/27/2007 Document Revised: 11/16/2015 Document Reviewed: 01/05/2013 Elsevier Interactive Patient Education  2017 [employer]vier [employer].  

## 2017-10-25 LAB — COMPREHENSIVE METABOLIC PANEL
A/G RATIO: 2 (ref 1.2–2.2)
ALBUMIN: 4.7 g/dL (ref 3.5–5.5)
ALK PHOS: 83 IU/L (ref 39–117)
ALT: 9 IU/L (ref 0–32)
AST: 13 IU/L (ref 0–40)
BUN / CREAT RATIO: 22 (ref 9–23)
BUN: 14 mg/dL (ref 6–20)
Bilirubin Total: 0.3 mg/dL (ref 0.0–1.2)
CHLORIDE: 105 mmol/L (ref 96–106)
CO2: 25 mmol/L (ref 20–29)
CREATININE: 0.65 mg/dL (ref 0.57–1.00)
Calcium: 9.2 mg/dL (ref 8.7–10.2)
GFR calc Af Amer: 136 mL/min/{1.73_m2} (ref >59)
GFR calc non Af Amer: 118 mL/min/{1.73_m2} (ref >59)
Globulin, Total: 2.4 g/dL (ref 1.5–4.5)
Glucose: 88 mg/dL (ref 65–99)
Potassium: 3.9 mmol/L (ref 3.5–5.2)
Sodium: 143 mmol/L (ref 134–144)
Total Protein: 7.1 g/dL (ref 6.0–8.5)

## 2017-10-25 LAB — HEPATIC FUNCTION PANEL: Bilirubin, Direct: 0.09 mg/dL (ref 0.00–0.40)

## 2017-10-26 ENCOUNTER — Encounter: Payer: Self-pay | Admitting: Gastroenterology

## 2017-10-27 NOTE — Progress Notes (Signed)
Normal Bilirubin level - released to MY CHART. Patient has been seen since and doing well. Has cholecystectomy scheduled with Dr. Luretha Murphy [facility] in [place]. Patient will follow up as needed and instructed.

## 2017-11-06 ENCOUNTER — Telehealth: Payer: Self-pay

## 2017-11-06 NOTE — Telephone Encounter (Signed)
Pt called inquiring about a refund? Message sent to [facility].

## 2017-11-12 ENCOUNTER — Encounter (INDEPENDENT_AMBULATORY_CARE_PROVIDER_SITE_OTHER): Payer: Self-pay

## 2017-11-21 ENCOUNTER — Encounter: Payer: Self-pay | Admitting: Endocrinology

## 2017-11-21 ENCOUNTER — Encounter
Admission: RE | Admit: 2017-11-21 | Discharge: 2017-11-21 | Disposition: A | Discharge status: Home / Self Care | Payer: BLUE CROSS/BLUE SHIELD | Source: Ambulatory Visit | Attending: Surgery | Admitting: Surgery

## 2017-11-21 ENCOUNTER — Other Ambulatory Visit: Payer: Self-pay

## 2017-11-21 DIAGNOSIS — Z01818 Encounter for other preprocedural examination: Secondary | ICD-10-CM | POA: Diagnosis not present

## 2017-11-21 HISTORY — DX: Unspecified asthma, uncomplicated: J45.909

## 2017-11-21 HISTORY — DX: Gastro-esophageal reflux disease without esophagitis: K21.9

## 2017-11-21 HISTORY — DX: Anxiety disorder, unspecified: F41.9

## 2017-11-21 NOTE — Patient Instructions (Signed)
Your procedure is scheduled on: Friday, November 28, 2017  Report to THE SECOND FLOOR OF THE MEDICAL MALL.  DO NOT STOP AT THE FIRST FLOOR TO REGISTER.  To find out your arrival time please call [phone] between 1PM - 3PM on Thursday, November 27, 2017  Remember: Instructions that are not followed completely may result in serious medical risk,  up to and including death, or upon the discretion of your surgeon and anesthesiologist your  surgery may need to be rescheduled.     _X__ 1. Do not eat food after midnight the night before your procedure.                 No gum chewing or hard candies.                  You may drink clear liquids up to 2 hours before you are scheduled to arrive for your surgery-                   DO not drink clear liquids within 2 hours of the start of your surgery.                  Clear Liquids include:  water, apple juice without pulp, clear carbohydrate                 drink such as Clearfast of Gatorade, Black Coffee or Tea (Do not add                 anything to coffee or tea).  __X__2.  On the morning of surgery brush your teeth with toothpaste and water,                         you may rinse your mouth with mouthwash if you wish.                             Do not swallow any toothpaste of mouthwash.     _X__ 3.  No Alcohol for 24 hours before or after surgery.   _X__ 4.  Do Not Smoke or use e-cigarettes For 24 Hours Prior to Your Surgery.                 Do not use any chewable tobacco products for at least 6 hours prior to                 surgery.  ____  5.  Bring all medications with you on the day of surgery if instructed.   __X__  6.  Notify your doctor if there is any change in your medical condition      (cold, fever, infections).     Do not wear jewelry, make-up, hairpins, clips or nail polish. Do not wear lotions, powders, or perfumes. You may wear deodorant. Do not shave 48 hours prior to surgery. Men may shave face  and neck. Do not bring valuables to the hospital.    [facility] is not responsible for any belongings or valuables.  Contacts, dentures or bridgework may not be worn into surgery. Leave your suitcase in the car. After surgery it may be brought to your room. For patients admitted to the hospital, discharge time is determined by your treatment team.   Patients discharged the day of surgery will not be allowed to drive home.   Please read over the following  fact sheets that you were given:   PREPARING FOR SURGERY  ____ Take these medicines the morning of surgery with A SIP OF WATER:    1. ZANTAC  2.   3.   4.  5.  6.  ____ Fleet Enema (as directed)   _X___ Use CHG Soap as directed  ____ Use inhalers on the day of surgery   _X__ Stop ALL ASPIRIN PRODUCTS AS OF TODAY.               THIS INCLUDES EXCEDRIN / BC POWDERS / GOODYS POWDER  _X___ Stop Anti-inflammatories AS OF TODAY                THIS INCLUDES IBUPROFEN / MOTRIN / ADVIL / ALEVE   __X__ Stop supplements until after surgery.                 THIS INCLUDES PRENATAL VITAMINS   ____ Bring C-Pap to the hospital.   WEAR COMFORTABLE AND LOOSE CLOTHING TO THE  HOSPITAL.  YOU MAY TAKE TYLENOL AT ANY TIME PRIOR TO SURGERY.

## 2017-11-21 NOTE — OR Nursing (Signed)
Patient is currently breastfeeding and is concerned about post op narcotics and when she can resume breastfeeding.

## 2017-11-23 NOTE — H&P (Signed)
Diane [name] Location: [facility] Patient #: [id number] DOB: May 12, 1986 Married / Language: English / Race: White Female   History of Present Illness   The patient is a 32 year old female who presents for evaluation of gallbladder disease. Diane [name] has a 33-year-old and a child born in December. She is still breast-feeding. In January or early February on the day of this bowl she was eating some wings and had severe midepigastric pain. This lasted about 30 minutes. Laboratory that time showed she bumped her transaminases. There was no elevation of her bilirubin noted.  She was referred by her primary care to surgeon who was equivocal and thinking whether this was reflux or gallbladder and then a gastroenterologist who feels that she has symptomatic gallstones. Based on her symptoms think that this was an episode of biliary colic. I don't think she passed a common bile duct stone more likely has intermittent ball valving in the cystic duct. Since that severe attack she has not had any other attacks. We discussed surgery versus no surgery. I think that she is symptomatic and I would recommend laparoscopic cholecystectomy. I discussed the procedure with her in some detail gave her a booklet on this.  I performed a physical exam which did not reveal any abnormalities. She wants to schedule this in May when her mother is off work again, give her some childcare relief. Think that would be fine. She prefer to have this done area and see.   Diagnostic Studies History  Colonoscopy  never Mammogram  never Pap Smear  1-5 years ago  Allergies  No Known Drug Allergies  Medication History  Levothyroxine Sodium (75MCG Tablet, Oral) Active. Norethindrone (0.35MG  Tablet, Oral) Active. Ranitidine 150 Max Strength (150MG  Tablet, Oral) Active. Prenatal Multi Natural (Oral) Active. Medications Reconciled  Social History Alcohol use  Occasional alcohol  use. Caffeine use  Coffee. No drug use  Tobacco use  Never smoker.  Pregnancy / Birth History  Age at menarche  12 years. Contraceptive History  Oral contraceptives. Gravida  2 Maternal age  726-30 Para  2 Regular periods   Other Problems  Back Pain  Cholelithiasis  Gastroesophageal Reflux Disease  Hemorrhoids  Thyroid Disease     Review of Systems General Present- Weight Loss. Not Present- Appetite Loss, Chills, Fatigue, Fever, Night Sweats and Weight Gain. Skin Not Present- Change in Wart/Mole, Dryness, Hives, Jaundice, New Lesions, Non-Healing Wounds, Rash and Ulcer. HEENT Present- Wears glasses/contact lenses. Not Present- Earache, Hearing Loss, Hoarseness, Nose Bleed, Oral Ulcers, Ringing in the Ears, Seasonal Allergies, Sinus Pain, Sore Throat, Visual Disturbances and Yellow Eyes. Respiratory Not Present- Bloody sputum, Chronic Cough, Difficulty Breathing, Snoring and Wheezing. Breast Not Present- Breast Mass, Breast Pain, Nipple Discharge and Skin Changes. Cardiovascular Present- Chest Pain. Not Present- Difficulty Breathing Lying Down, Leg Cramps, Palpitations, Rapid Heart Rate, Shortness of Breath and Swelling of Extremities. Gastrointestinal Present- Abdominal Pain and Indigestion. Not Present- Bloating, Bloody Stool, Change in Bowel Habits, Chronic diarrhea, Constipation, Difficulty Swallowing, Excessive gas, Gets full quickly at meals, Hemorrhoids, Nausea, Rectal Pain and Vomiting. Female Genitourinary Not Present- Frequency, Nocturia, Painful Urination, Pelvic Pain and Urgency. Musculoskeletal Present- Back Pain. Not Present- Joint Pain, Joint Stiffness, Muscle Pain, Muscle Weakness and Swelling of Extremities. Neurological Not Present- Decreased Memory, Fainting, Headaches, Numbness, Seizures, Tingling, Tremor, Trouble walking and Weakness. Psychiatric Present- Anxiety. Not Present- Bipolar, Change in Sleep Pattern, Depression, Fearful and  Frequent crying. Endocrine Not Present- Cold Intolerance, Excessive Hunger, Hair Changes, Heat Intolerance, Hot flashes  and New Diabetes. Hematology Not Present- Blood Thinners, Easy Bruising, Excessive bleeding, Gland problems, HIV and Persistent Infections.  Vitals  Weight: 172.6 lb Height: 65in Body Surface Area: 1.86 m Body Mass Index: 28.72 kg/m  Pulse: 93 (Regular)  BP: 124/72 (Sitting, Left Arm, Standard)       Physical Exam  The physical exam findings are as follows: Note:WDWNWF NAD HEENT no icterus Neck supple Chest clear Heart SR without murmurs or gallops Abdomen nontender at present Ext FROM no hx of DVT    Assessment & Plan CHOLECYSTITIS, CHRONIC (K81.1) Impression: I have discussed lap chole with her in detail and will schedule at [facility] ~ May. She is aware of the risks and benefits not limited to bile leaks and CBD injuries.  Matt B. Daphine Deutscher, MD, FACS

## 2017-11-25 ENCOUNTER — Other Ambulatory Visit: Payer: Self-pay | Admitting: Endocrinology

## 2017-11-25 DIAGNOSIS — E061 Subacute thyroiditis: Secondary | ICD-10-CM

## 2017-11-27 ENCOUNTER — Other Ambulatory Visit: Payer: BLUE CROSS/BLUE SHIELD

## 2017-11-27 ENCOUNTER — Other Ambulatory Visit: Payer: Self-pay | Admitting: Endocrinology

## 2017-11-27 DIAGNOSIS — E061 Subacute thyroiditis: Secondary | ICD-10-CM

## 2017-11-27 MED ORDER — SODIUM CHLORIDE 0.9 % IV SOLN
2.0000 g | INTRAVENOUS | Status: AC
  Administered 2017-11-28: 2 g via INTRAVENOUS
  Filled 2017-11-27: qty 2

## 2017-11-28 ENCOUNTER — Encounter
Admission: RE | Disposition: A | Discharge status: Home / Self Care | Payer: Self-pay | Source: Ambulatory Visit | Attending: Surgery

## 2017-11-28 ENCOUNTER — Ambulatory Visit
Admission: RE | Admit: 2017-11-28 | Discharge: 2017-11-28 | Disposition: A | Discharge status: Home / Self Care | Payer: BLUE CROSS/BLUE SHIELD | Source: Ambulatory Visit | Attending: Surgery | Admitting: Surgery

## 2017-11-28 ENCOUNTER — Ambulatory Visit: Payer: BLUE CROSS/BLUE SHIELD | Admitting: Anesthesiology

## 2017-11-28 ENCOUNTER — Other Ambulatory Visit: Payer: Self-pay

## 2017-11-28 ENCOUNTER — Ambulatory Visit: Payer: BLUE CROSS/BLUE SHIELD

## 2017-11-28 DIAGNOSIS — Z419 Encounter for procedure for purposes other than remedying health state, unspecified: Secondary | ICD-10-CM

## 2017-11-28 DIAGNOSIS — K8010 Calculus of gallbladder with chronic cholecystitis without obstruction: Secondary | ICD-10-CM | POA: Insufficient documentation

## 2017-11-28 DIAGNOSIS — K811 Chronic cholecystitis: Secondary | ICD-10-CM | POA: Diagnosis not present

## 2017-11-28 DIAGNOSIS — Z9049 Acquired absence of other specified parts of digestive tract: Secondary | ICD-10-CM

## 2017-11-28 DIAGNOSIS — E119 Type 2 diabetes mellitus without complications: Secondary | ICD-10-CM | POA: Diagnosis not present

## 2017-11-28 DIAGNOSIS — K219 Gastro-esophageal reflux disease without esophagitis: Secondary | ICD-10-CM | POA: Diagnosis not present

## 2017-11-28 DIAGNOSIS — E039 Hypothyroidism, unspecified: Secondary | ICD-10-CM | POA: Diagnosis not present

## 2017-11-28 HISTORY — PX: CHOLECYSTECTOMY: SHX55

## 2017-11-28 LAB — POCT PREGNANCY, URINE: Preg Test, Ur: NEGATIVE

## 2017-11-28 SURGERY — LAPAROSCOPIC CHOLECYSTECTOMY WITH INTRAOPERATIVE CHOLANGIOGRAM
Anesthesia: General

## 2017-11-28 MED ORDER — FENTANYL CITRATE (PF) 100 MCG/2ML IJ SOLN
INTRAMUSCULAR | Status: AC
  Filled 2017-11-28: qty 2

## 2017-11-28 MED ORDER — DEXAMETHASONE SODIUM PHOSPHATE 10 MG/ML IJ SOLN
INTRAMUSCULAR | Status: AC
  Filled 2017-11-28: qty 1

## 2017-11-28 MED ORDER — ONDANSETRON HCL 4 MG/2ML IJ SOLN: 4.0000 mg | Freq: Once | INTRAMUSCULAR | Status: DC | PRN

## 2017-11-28 MED ORDER — ONDANSETRON HCL 4 MG/2ML IJ SOLN
INTRAMUSCULAR | Status: DC | PRN
  Administered 2017-11-28: 4 mg via INTRAVENOUS

## 2017-11-28 MED ORDER — ONDANSETRON HCL 4 MG/2ML IJ SOLN
INTRAMUSCULAR | Status: AC
  Filled 2017-11-28: qty 2

## 2017-11-28 MED ORDER — MIDAZOLAM HCL 2 MG/2ML IJ SOLN
INTRAMUSCULAR | Status: DC | PRN
  Administered 2017-11-28: 2 mg via INTRAVENOUS

## 2017-11-28 MED ORDER — SUGAMMADEX SODIUM 200 MG/2ML IV SOLN
INTRAVENOUS | Status: DC | PRN
  Administered 2017-11-28: 150 mg via INTRAVENOUS

## 2017-11-28 MED ORDER — HEPARIN SODIUM (PORCINE) 5000 UNIT/ML IJ SOLN
INTRAMUSCULAR | Status: AC
Start: 2017-11-28 — End: 2017-11-28
  Administered 2017-11-28: 5000 [IU] via SUBCUTANEOUS
  Filled 2017-11-28: qty 1

## 2017-11-28 MED ORDER — CHLORHEXIDINE GLUCONATE CLOTH 2 % EX PADS: 6.0000 | MEDICATED_PAD | Freq: Once | CUTANEOUS | Status: DC

## 2017-11-28 MED ORDER — PROPOFOL 10 MG/ML IV BOLUS
INTRAVENOUS | Status: AC
  Filled 2017-11-28: qty 20

## 2017-11-28 MED ORDER — HEPARIN SODIUM (PORCINE) 5000 UNIT/ML IJ SOLN
5000.0000 [IU] | Freq: Once | INTRAMUSCULAR | Status: AC
  Administered 2017-11-28: 5000 [IU] via SUBCUTANEOUS

## 2017-11-28 MED ORDER — GABAPENTIN 300 MG PO CAPS
300.0000 mg | ORAL_CAPSULE | ORAL | Status: AC
  Administered 2017-11-28: 300 mg via ORAL

## 2017-11-28 MED ORDER — MIDAZOLAM HCL 2 MG/2ML IJ SOLN
INTRAMUSCULAR | Status: AC
  Filled 2017-11-28: qty 2

## 2017-11-28 MED ORDER — BUPIVACAINE LIPOSOME 1.3 % IJ SUSP
20.0000 mL | Freq: Once | INTRAMUSCULAR | Status: DC
  Filled 2017-11-28: qty 20

## 2017-11-28 MED ORDER — LIDOCAINE HCL (CARDIAC) PF 100 MG/5ML IV SOSY
PREFILLED_SYRINGE | INTRAVENOUS | Status: DC | PRN
  Administered 2017-11-28: 100 mg via INTRAVENOUS

## 2017-11-28 MED ORDER — FENTANYL CITRATE (PF) 100 MCG/2ML IJ SOLN
INTRAMUSCULAR | Status: AC
  Administered 2017-11-28: 25 ug via INTRAVENOUS
  Filled 2017-11-28: qty 2

## 2017-11-28 MED ORDER — LACTATED RINGERS IV SOLN
INTRAVENOUS | Status: DC
  Administered 2017-11-28: 06:00:00 via INTRAVENOUS

## 2017-11-28 MED ORDER — PHENYLEPHRINE HCL 10 MG/ML IJ SOLN
INTRAMUSCULAR | Status: DC | PRN
  Administered 2017-11-28 (×3): 200 ug via INTRAVENOUS

## 2017-11-28 MED ORDER — GABAPENTIN 300 MG PO CAPS
ORAL_CAPSULE | ORAL | Status: AC
Start: 2017-11-28 — End: 2017-11-28
  Administered 2017-11-28: 300 mg via ORAL
  Filled 2017-11-28: qty 1

## 2017-11-28 MED ORDER — FENTANYL CITRATE (PF) 100 MCG/2ML IJ SOLN
INTRAMUSCULAR | Status: DC | PRN
  Administered 2017-11-28 (×2): 50 ug via INTRAVENOUS

## 2017-11-28 MED ORDER — ACETAMINOPHEN 500 MG PO TABS
ORAL_TABLET | ORAL | Status: AC
Start: 2017-11-28 — End: 2017-11-28
  Administered 2017-11-28: 1000 mg via ORAL
  Filled 2017-11-28: qty 2

## 2017-11-28 MED ORDER — CELECOXIB 200 MG PO CAPS
ORAL_CAPSULE | ORAL | Status: AC
  Administered 2017-11-28: 200 mg via ORAL
  Filled 2017-11-28: qty 1

## 2017-11-28 MED ORDER — ROCURONIUM BROMIDE 50 MG/5ML IV SOLN
INTRAVENOUS | Status: AC
  Filled 2017-11-28: qty 1

## 2017-11-28 MED ORDER — PROPOFOL 10 MG/ML IV BOLUS
INTRAVENOUS | Status: DC | PRN
  Administered 2017-11-28: 200 mg via INTRAVENOUS

## 2017-11-28 MED ORDER — DEXAMETHASONE SODIUM PHOSPHATE 10 MG/ML IJ SOLN
INTRAMUSCULAR | Status: DC | PRN
  Administered 2017-11-28: 6 mg via INTRAVENOUS

## 2017-11-28 MED ORDER — ACETAMINOPHEN 500 MG PO TABS
1000.0000 mg | ORAL_TABLET | ORAL | Status: AC
  Administered 2017-11-28: 1000 mg via ORAL

## 2017-11-28 MED ORDER — LIDOCAINE HCL (PF) 2 % IJ SOLN
INTRAMUSCULAR | Status: AC
  Filled 2017-11-28: qty 10

## 2017-11-28 MED ORDER — BUPIVACAINE LIPOSOME 1.3 % IJ SUSP
INTRAMUSCULAR | Status: DC | PRN
  Administered 2017-11-28: 20 mL

## 2017-11-28 MED ORDER — CELECOXIB 200 MG PO CAPS
200.0000 mg | ORAL_CAPSULE | ORAL | Status: AC
  Administered 2017-11-28: 200 mg via ORAL

## 2017-11-28 MED ORDER — FENTANYL CITRATE (PF) 100 MCG/2ML IJ SOLN
25.0000 ug | INTRAMUSCULAR | Status: DC | PRN
  Administered 2017-11-28 (×2): 25 ug via INTRAVENOUS

## 2017-11-28 MED ORDER — ROCURONIUM BROMIDE 100 MG/10ML IV SOLN
INTRAVENOUS | Status: DC | PRN
  Administered 2017-11-28: 40 mg via INTRAVENOUS

## 2017-11-28 SURGICAL SUPPLY — 44 items
APPLIER CLIP 5 13 M/L LIGAMAX5 (MISCELLANEOUS)
APPLIER CLIP ROT 10 11.4 M/L (STAPLE) ×2
CANISTER SUCT 1200ML W/VALVE (MISCELLANEOUS) ×2 IMPLANT
CATH REDDICK CHOLANGI 4FR 50CM (CATHETERS) ×2 IMPLANT
CHLORAPREP W/TINT 26ML (MISCELLANEOUS) ×2 IMPLANT
CLIP APPLIE 5 13 M/L LIGAMAX5 (MISCELLANEOUS) IMPLANT
CLIP APPLIE ROT 10 11.4 M/L (STAPLE) ×1 IMPLANT
DERMABOND ADVANCED (GAUZE/BANDAGES/DRESSINGS) ×1
DERMABOND ADVANCED .7 DNX12 (GAUZE/BANDAGES/DRESSINGS) ×1 IMPLANT
DRAPE C-ARM XRAY 36X54 (DRAPES) ×2 IMPLANT
ELECT REM PT RETURN 9FT ADLT (ELECTROSURGICAL) ×2
ELECTRODE REM PT RTRN 9FT ADLT (ELECTROSURGICAL) ×1 IMPLANT
GLOVE BIO SURGEON STRL SZ8 (GLOVE) ×2 IMPLANT
GOWN STRL REUS W/ TWL LRG LVL3 (GOWN DISPOSABLE) ×3 IMPLANT
GOWN STRL REUS W/TWL 2XL LVL3 (GOWN DISPOSABLE) ×2 IMPLANT
GOWN STRL REUS W/TWL LRG LVL3 (GOWN DISPOSABLE) ×3
IRRIGATION STRYKERFLOW (MISCELLANEOUS) ×1 IMPLANT
IRRIGATOR STRYKERFLOW (MISCELLANEOUS) ×2
IV CATH ANGIO 12GX3 LT BLUE (NEEDLE) ×2 IMPLANT
IV NS 1000ML (IV SOLUTION) ×1
IV NS 1000ML BAXH (IV SOLUTION) ×1 IMPLANT
KIT PINK PAD W/HEAD ARE REST (MISCELLANEOUS) ×2
KIT PINK PAD W/HEAD ARM REST (MISCELLANEOUS) ×1 IMPLANT
KIT TURNOVER KIT A (KITS) ×2 IMPLANT
L-HOOK LAP DISP 36CM (ELECTROSURGICAL) ×2
LHOOK LAP DISP 36CM (ELECTROSURGICAL) ×1 IMPLANT
NEEDLE HYPO 22GX1.5 SAFETY (NEEDLE) ×2 IMPLANT
NS IRRIG 1000ML POUR BTL (IV SOLUTION) ×2 IMPLANT
NS IRRIG 500ML POUR BTL (IV SOLUTION) ×2 IMPLANT
PACK LAP CHOLECYSTECTOMY (MISCELLANEOUS) ×2 IMPLANT
PENCIL ELECTRO HAND CTR (MISCELLANEOUS) ×2 IMPLANT
POUCH SPECIMEN RETRIEVAL 10MM (ENDOMECHANICALS) ×2 IMPLANT
SCISSORS METZENBAUM CVD 33 (INSTRUMENTS) ×2 IMPLANT
SLEEVE ENDOPATH XCEL 5M (ENDOMECHANICALS) ×4 IMPLANT
SUT MNCRL AB 4-0 PS2 18 (SUTURE) ×2 IMPLANT
SUT VIC AB 4-0 SH 27 (SUTURE) ×1
SUT VIC AB 4-0 SH 27XANBCTRL (SUTURE) ×1 IMPLANT
SUT VICRYL 0 AB UR-6 (SUTURE) ×2 IMPLANT
SYR 10ML LL (SYRINGE) ×2 IMPLANT
TOWEL OR 17X26 4PK STRL BLUE (TOWEL DISPOSABLE) ×2 IMPLANT
TROCAR XCEL BLUNT TIP 100MML (ENDOMECHANICALS) ×2 IMPLANT
TROCAR XCEL NON-BLD 11X100MML (ENDOMECHANICALS) ×2 IMPLANT
TROCAR XCEL NON-BLD 5MMX100MML (ENDOMECHANICALS) ×2 IMPLANT
TUBING INSUFFLATION (TUBING) ×2 IMPLANT

## 2017-11-28 NOTE — Discharge Instructions (Signed)

## 2017-11-28 NOTE — Transfer of Care (Signed)
Immediate Anesthesia Transfer of Care Note  Patient: Diane [name]  Procedure(s) Performed: LAPAROSCOPIC CHOLECYSTECTOMY WITH INTRAOPERATIVE CHOLANGIOGRAM (N/A )  Patient Location: PACU  Anesthesia Type:General  Level of Consciousness: awake and alert   Airway & Oxygen Therapy: Patient Spontanous Breathing and Patient connected to face mask oxygen  Post-op Assessment: Report given to RN and Post -op Vital signs reviewed and stable  Post vital signs: Reviewed and stable  Last Vitals:  Vitals Value Taken Time  BP 124/83 11/28/2017  9:09 AM  Temp    Pulse 67 11/28/2017  9:10 AM  Resp 14 11/28/2017  9:10 AM  SpO2 100 % 11/28/2017  9:10 AM  Vitals shown include unvalidated device data.  Last Pain:  Vitals:   11/28/17 0610  TempSrc: Temporal  PainSc: 0-No pain         Complications: No apparent anesthesia complications

## 2017-11-28 NOTE — Anesthesia Postprocedure Evaluation (Signed)
Anesthesia Post Note  Patient: Diane [name]  Procedure(s) Performed: LAPAROSCOPIC CHOLECYSTECTOMY WITH INTRAOPERATIVE CHOLANGIOGRAM (N/A )  Patient location during evaluation: PACU Anesthesia Type: General Level of consciousness: awake and alert Pain management: pain level controlled Vital Signs Assessment: post-procedure vital signs reviewed and stable Respiratory status: spontaneous breathing and respiratory function stable Cardiovascular status: stable Anesthetic complications: no     Last Vitals:  Vitals:   11/28/17 0910 11/28/17 0919  BP: 124/83 117/71  Pulse: 64 67  Resp: 15 16  Temp: (!) 36.1 C   SpO2: 100% 100%    Last Pain:  Vitals:   11/28/17 0919  TempSrc:   PainSc: 0-No pain                 KEPHART,WILLIAM K

## 2017-11-28 NOTE — Anesthesia Preprocedure Evaluation (Signed)
Anesthesia Evaluation  Patient identified by MRN, date of birth, ID band Patient awake    Reviewed: Allergy & Precautions, NPO status , Patient's Chart, lab work & pertinent test results  History of Anesthesia Complications Negative for: history of anesthetic complications  Airway Mallampati: II       Dental   Pulmonary asthma (not used inhalers in years) , neg sleep apnea, neg COPD,           Cardiovascular (-) hypertension(-) Past MI and (-) CHF (-) dysrhythmias (-) Valvular Problems/Murmurs     Neuro/Psych neg Seizures Anxiety    GI/Hepatic Neg liver ROS, GERD  Medicated and Poorly Controlled,  Endo/Other  diabetes, GestationalHypothyroidism   Renal/GU negative Renal ROS     Musculoskeletal   Abdominal   Peds  Hematology   Anesthesia Other Findings   Reproductive/Obstetrics                             Anesthesia Physical Anesthesia Plan  ASA: II  Anesthesia Plan: General   Post-op Pain Management:    Induction: Intravenous  PONV Risk Score and Plan: 3 and Dexamethasone, Ondansetron and Midazolam  Airway Management Planned: Oral ETT  Additional Equipment:   Intra-op Plan:   Post-operative Plan:   Informed Consent: I have reviewed the patients History and Physical, chart, labs and discussed the procedure including the risks, benefits and alternatives for the proposed anesthesia with the patient or authorized representative who has indicated his/her understanding and acceptance.     Plan Discussed with:   Anesthesia Plan Comments:         Anesthesia Quick Evaluation

## 2017-11-28 NOTE — Interval H&P Note (Signed)
History and Physical Interval Note:  11/28/2017 7:25 AM  Diane [name]  has presented today for surgery, with the diagnosis of CHRONIC CHOLECYSTITIS  The various methods of treatment have been discussed with the patient and family. After consideration of risks, benefits and other options for treatment, the patient has consented to  Procedure(s): LAPAROSCOPIC CHOLECYSTECTOMY WITH INTRAOPERATIVE CHOLANGIOGRAM (N/A) as a surgical intervention .  The patient's history has been reviewed, patient examined, no change in status, stable for surgery.  I have reviewed the patient's chart and labs.  Questions were answered to the patient's satisfaction.     Valarie MerinoMatthew B Susan Bleich

## 2017-11-28 NOTE — Anesthesia Procedure Notes (Signed)
Procedure Name: Intubation Date/Time: 11/28/2017 7:41 AM Performed by: Philbert Riser, CRNA Pre-anesthesia Checklist: Patient identified, Emergency Drugs available, Suction available, Patient being monitored and Timeout performed Patient Re-evaluated:Patient Re-evaluated prior to induction Oxygen Delivery Method: Circle system utilized and Simple face mask Preoxygenation: Pre-oxygenation with 100% oxygen Induction Type: IV induction Ventilation: Mask ventilation without difficulty Laryngoscope Size: Mac and 3 Grade View: Grade I Tube type: Oral Tube size: 7.0 mm Number of attempts: 1 Airway Equipment and Method: Stylet Placement Confirmation: ETT inserted through vocal cords under direct vision,  positive ETCO2 and breath sounds checked- equal and bilateral Secured at: 19 cm Tube secured with: Tape Dental Injury: Teeth and Oropharynx as per pre-operative assessment

## 2017-11-28 NOTE — Anesthesia Post-op Follow-up Note (Signed)
Anesthesia QCDR form completed.        

## 2017-11-28 NOTE — Op Note (Signed)
Diane [name]  [facility]  [facility]    11/28/2017  9:02 AM  Procedure: Laparoscopic Cholecystectomy with intraoperative cholangiogram  Surgeon: Susy FrizzleMatt B. Daphine DeutscherMartin, MD, FACS Asst:  Felicity PellegriniP J Toth, MD, FACS  Anes:  General  Drains:  None  Findings: Chronic cholecystitis with cholesterol stones impacted in cystic duct  Description of Procedure: The patient was taken to OR 7 and given general anesthesia.  The patient was prepped with chlorohexidine and draped sterilely. A time out was performed.  Access to the abdomen was achieved with a 5 mm Optiview through the right upper quadrant.  Port placement included three 5 mm trocars and one 11 mm trocar in the upper midline.    The gallbladder was visualized and the fundus was grasped and the gallbladder was elevated. Traction on the infundibulum allowed for successful demonstration of the critical view. Inflammatory changes were chronic.  The cystic duct was identified and clipped up on the gallbladder and an incision was made in the cystic duct and the Reddick catheter was inserted after milking the cystic duct of any debris-multiple yellow flecks of stone came out. A dynamic cholangiogram was performed which demonstrated intrahepatic filling and flow into the duodenum.  No stones were seen.  .    The cystic duct was then triple clipped and divided, the cystic artery was double clipped and divided and then the gallbladder was removed from the gallbladder bed. Removal of the gallbladder from the gallbladder bed was performed without entering it.  The gallbladder was then placed in a bag and brought out through one of the trocar sites. The gallbladder bed was inspected and no bleeding or bile leaks were seen.   Incisions were injected with Exparel and closed with 4-0 Monocryl and Dermabond on the skin.  Sponge and needle count were correct.    The patient was taken to the recovery room in satisfactory condition.

## 2017-11-28 NOTE — Progress Notes (Signed)
Patient exhibited anxiety, though controlled. Chaplain provided emotional support and helped patient talk about the source of the anxiety and her concerns about surgery. Chaplain encouraged patient to trust in the medical team and their ability to care for her.

## 2017-12-01 LAB — SURGICAL PATHOLOGY

## 2017-12-03 LAB — SPECIMEN STATUS REPORT

## 2017-12-03 LAB — T4, FREE: Free T4: 1.66 ng/dL (ref 0.82–1.77)

## 2017-12-03 LAB — TSH: TSH: 0.227 u[IU]/mL — ABNORMAL LOW (ref 0.450–4.500)

## 2017-12-04 ENCOUNTER — Encounter: Payer: Self-pay | Admitting: Endocrinology

## 2017-12-04 ENCOUNTER — Other Ambulatory Visit: Payer: Self-pay | Admitting: Endocrinology

## 2017-12-04 MED ORDER — LEVOTHYROXINE SODIUM 25 MCG PO TABS: 25.0000 ug | ORAL_TABLET | Freq: Every day | ORAL | 3 refills | Status: DC

## 2017-12-23 ENCOUNTER — Encounter: Payer: Self-pay | Admitting: Obstetrics and Gynecology

## 2017-12-26 ENCOUNTER — Other Ambulatory Visit: Payer: BLUE CROSS/BLUE SHIELD

## 2017-12-26 DIAGNOSIS — O24419 Gestational diabetes mellitus in pregnancy, unspecified control: Secondary | ICD-10-CM

## 2017-12-27 LAB — GLUCOSE, TWO-HOUR POSTPRANDIAL: GLUCOSE, TWO-HOUR POSTPRANDIAL: 108 mg/dL (ref 65–139)

## 2018-01-01 ENCOUNTER — Encounter: Payer: BLUE CROSS/BLUE SHIELD | Admitting: Certified Nurse Midwife

## 2018-01-23 DIAGNOSIS — M955 Acquired deformity of pelvis: Secondary | ICD-10-CM | POA: Diagnosis not present

## 2018-01-23 DIAGNOSIS — M9905 Segmental and somatic dysfunction of pelvic region: Secondary | ICD-10-CM | POA: Diagnosis not present

## 2018-01-23 DIAGNOSIS — M9903 Segmental and somatic dysfunction of lumbar region: Secondary | ICD-10-CM | POA: Diagnosis not present

## 2018-01-23 DIAGNOSIS — M5416 Radiculopathy, lumbar region: Secondary | ICD-10-CM | POA: Diagnosis not present

## 2018-01-26 DIAGNOSIS — M5416 Radiculopathy, lumbar region: Secondary | ICD-10-CM | POA: Diagnosis not present

## 2018-01-26 DIAGNOSIS — M955 Acquired deformity of pelvis: Secondary | ICD-10-CM | POA: Diagnosis not present

## 2018-01-26 DIAGNOSIS — M9903 Segmental and somatic dysfunction of lumbar region: Secondary | ICD-10-CM | POA: Diagnosis not present

## 2018-01-26 DIAGNOSIS — M9905 Segmental and somatic dysfunction of pelvic region: Secondary | ICD-10-CM | POA: Diagnosis not present

## 2018-01-28 DIAGNOSIS — M955 Acquired deformity of pelvis: Secondary | ICD-10-CM | POA: Diagnosis not present

## 2018-01-28 DIAGNOSIS — M5416 Radiculopathy, lumbar region: Secondary | ICD-10-CM | POA: Diagnosis not present

## 2018-01-28 DIAGNOSIS — M9903 Segmental and somatic dysfunction of lumbar region: Secondary | ICD-10-CM | POA: Diagnosis not present

## 2018-01-28 DIAGNOSIS — M9905 Segmental and somatic dysfunction of pelvic region: Secondary | ICD-10-CM | POA: Diagnosis not present

## 2018-01-29 ENCOUNTER — Ambulatory Visit (INDEPENDENT_AMBULATORY_CARE_PROVIDER_SITE_OTHER): Payer: BLUE CROSS/BLUE SHIELD | Admitting: Obstetrics and Gynecology

## 2018-01-29 ENCOUNTER — Encounter: Payer: Self-pay | Admitting: Obstetrics and Gynecology

## 2018-01-29 ENCOUNTER — Telehealth: Payer: Self-pay | Admitting: Obstetrics and Gynecology

## 2018-01-29 VITALS — BP 119/82 | HR 80 | Ht 65.0 in | Wt 162.3 lb

## 2018-01-29 DIAGNOSIS — E061 Subacute thyroiditis: Secondary | ICD-10-CM

## 2018-01-29 DIAGNOSIS — Z01419 Encounter for gynecological examination (general) (routine) without abnormal findings: Secondary | ICD-10-CM | POA: Diagnosis not present

## 2018-01-29 DIAGNOSIS — E559 Vitamin D deficiency, unspecified: Secondary | ICD-10-CM | POA: Diagnosis not present

## 2018-01-29 NOTE — Progress Notes (Signed)
Subjective:   Boykin Nearingracey A Costin is a 32 y.o. 782P1001 Caucasian female here for a routine well-woman exam.  No LMP recorded.    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  [facility]       does desire labs  Social History: Sexual: heterosexual Marital Status: married Living situation: with family Occupation: unknown occupation Tobacco/alcohol: no tobacco use Illicit drugs: no history of illicit drug use  The following portions of the patient's history were reviewed and updated as appropriate: allergies, current medications, past family history, past medical history, past social history, past surgical history and problem list.  Past Medical History Past Medical History:  Diagnosis Date  . Anxiety   . Asthma    activity induced asthma  . Endometriosis   . Gall stones 07/30/2017  . GERD (gastroesophageal reflux disease)   . Gestational diabetes mellitus   . Hypothyroidism   . Ovarian cyst   . Stress incontinence 04/16/2017  . Thyroiditis   . Thyroiditis, 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    Past Surgical History Past Surgical History:  Procedure Laterality Date  . CHOLECYSTECTOMY N/A 11/28/2017   Procedure: LAPAROSCOPIC CHOLECYSTECTOMY WITH INTRAOPERATIVE CHOLANGIOGRAM;  Surgeon: Luretha MurphyMartin, Matthew, MD;  Location: [facility] ORS;  Service: General;  Laterality: N/A;  . LAPAROSCOPY  2009  . nsvd  [id number]   x 2    Gynecologic History G2P1001  No LMP recorded. Contraception: oral progesterone-only contraceptive Last Pap: 2016. Results were: normal   Obstetric History OB History  Gravida Para Term Preterm AB Living  2 1 1     1   SAB TAB Ectopic Multiple Live Births          1    # Outcome Date GA Lbr Len/2nd Weight Sex Delivery Anes PTL Lv  2 Gravida           1 Term 08/11/14 3520w0d   F Vag-Spont None  LIV    Current Medications Current Outpatient Medications on File Prior to Visit  Medication Sig Dispense Refill  . levothyroxine (SYNTHROID, LEVOTHROID) 25  MCG tablet Take 1 tablet (25 mcg total) by mouth daily before breakfast. 90 tablet 3  . norethindrone (MICRONOR,CAMILA,ERRIN) 0.35 MG tablet Take 1 tablet (0.35 mg total) by mouth daily. 3 Package 4  . Prenatal Vit-Fe Fumarate-FA (PRENATAL MULTIVITAMIN) TABS tablet Take 1 tablet by mouth daily.     Marland Kitchen. albuterol (PROAIR HFA) 108 (90 Base) MCG/ACT inhaler Inhale 2 puffs into the lungs every 6 (six) hours as needed for wheezing or shortness of breath. (Patient not taking: Reported on 11/28/2017) 1 Inhaler 2  . ibuprofen (ADVIL,MOTRIN) 200 MG tablet Take 400 mg by mouth every 6 (six) hours as needed for headache.     No current facility-administered medications on file prior to visit.     Review of Systems Patient denies any headaches, blurred vision, shortness of breath, chest pain, abdominal pain, problems with bowel movements, urination, or intercourse.  Objective:  BP 119/82   Pulse 80   Ht 5\' 5"  (1.651 m)   Wt 162 lb 4.8 oz (73.6 kg)   Breastfeeding? Yes   BMI 27.01 kg/m  Physical Exam  General:  Well developed, well nourished, no acute distress. She is alert and oriented x3. Skin:  Warm and dry Neck:  Midline trachea, + thyromegaly without nodules Cardiovascular: Regular rate and rhythm, no murmur heard Lungs:  Effort normal, all lung fields clear to auscultation bilaterally Breasts:  No dominant palpable mass, retraction, or nipple discharge Abdomen:  Soft, non tender, no hepatosplenomegaly or masses Pelvic:  External genitalia is normal in appearance.  The vagina is normal in appearance. The cervix is bulbous, no CMT.  Thin prep pap is done with HR HPV cotesting. Uterus is felt to be normal size, shape, and contour.  No adnexal masses or tenderness noted. Extremities:  No swelling or varicosities noted Psych:  She has a normal mood and affect  Assessment:   Healthy well-woman exam Vit d deficiency Thyroid disorder  Plan:  Vit D & thyroid labs checked-will follow up  accordingly F/U 1 year for AE  Zadie Deemer Suzan Nailer, CNM

## 2018-01-29 NOTE — Telephone Encounter (Signed)
The patient came into the office for an appointment, The patient asked who she needed to speak with in regards to her billing, I told the pt she would need to contact [facility] and that I would be able to provide that number for her. The patient got upset due to me not being able to provide her with an immediate solution. Kristal C. Was a witness upfront Thank you.

## 2018-01-29 NOTE — Patient Instructions (Signed)
Preventive Care 18-39 Years, Female Preventive care refers to lifestyle choices and visits with your health care provider that can promote health and wellness. What does preventive care include?  A yearly physical exam. This is also called an annual well check.  Dental exams once or twice a year.  Routine eye exams. Ask your health care provider how often you should have your eyes checked.  Personal lifestyle choices, including: ? Daily care of your teeth and gums. ? Regular physical activity. ? Eating a healthy diet. ? Avoiding tobacco and drug use. ? Limiting alcohol use. ? Practicing safe sex. ? Taking vitamin and mineral supplements as recommended by your health care provider. What happens during an annual well check? The services and screenings done by your health care provider during your annual well check will depend on your age, overall health, lifestyle risk factors, and family history of disease. Counseling Your health care provider may ask you questions about your:  Alcohol use.  Tobacco use.  Drug use.  Emotional well-being.  Home and relationship well-being.  Sexual activity.  Eating habits.  Work and work Statistician.  Method of birth control.  Menstrual cycle.  Pregnancy history.  Screening You may have the following tests or measurements:  Height, weight, and BMI.  Diabetes screening. This is done by checking your blood sugar (glucose) after you have not eaten for a while (fasting).  Blood pressure.  Lipid and cholesterol levels. These may be checked every 5 years starting at age 38.  Skin check.  Hepatitis C blood test.  Hepatitis B blood test.  Sexually transmitted disease (STD) testing.  BRCA-related cancer screening. This may be done if you have a family history of breast, ovarian, tubal, or peritoneal cancers.  Pelvic exam and Pap test. This may be done every 3 years starting at age 38. Starting at age 30, this may be done  every 5 years if you have a Pap test in combination with an HPV test.  Discuss your test results, treatment options, and if necessary, the need for more tests with your health care provider. Vaccines Your health care provider may recommend certain vaccines, such as:  Influenza vaccine. This is recommended every year.  Tetanus, diphtheria, and acellular pertussis (Tdap, Td) vaccine. You may need a Td booster every 10 years.  Varicella vaccine. You may need this if you have not been vaccinated.  HPV vaccine. If you are 39 or younger, you may need three doses over 6 months.  Measles, mumps, and rubella (MMR) vaccine. You may need at least one dose of MMR. You may also need a second dose.  Pneumococcal 13-valent conjugate (PCV13) vaccine. You may need this if you have certain conditions and were not previously vaccinated.  Pneumococcal polysaccharide (PPSV23) vaccine. You may need one or two doses if you smoke cigarettes or if you have certain conditions.  Meningococcal vaccine. One dose is recommended if you are age 68-21 years and a first-year college student living in a residence hall, or if you have one of several medical conditions. You may also need additional booster doses.  Hepatitis A vaccine. You may need this if you have certain conditions or if you travel or work in places where you may be exposed to hepatitis A.  Hepatitis B vaccine. You may need this if you have certain conditions or if you travel or work in places where you may be exposed to hepatitis B.  Haemophilus influenzae type b (Hib) vaccine. You may need this  if you have certain risk factors.  Talk to your health care provider about which screenings and vaccines you need and how often you need them. This information is not intended to replace advice given to you by your health care provider. Make sure you discuss any questions you have with your health care provider. Document Released: 08/06/2001 Document Revised:  02/28/2016 Document Reviewed: 04/11/2015 Elsevier Interactive Patient Education  2018 Elsevier Inc.  

## 2018-02-02 ENCOUNTER — Encounter: Payer: Self-pay | Admitting: Gastroenterology

## 2018-02-02 ENCOUNTER — Ambulatory Visit (INDEPENDENT_AMBULATORY_CARE_PROVIDER_SITE_OTHER): Payer: BLUE CROSS/BLUE SHIELD | Admitting: Gastroenterology

## 2018-02-02 ENCOUNTER — Other Ambulatory Visit: Payer: BLUE CROSS/BLUE SHIELD

## 2018-02-02 VITALS — BP 118/66 | HR 94 | Ht 65.0 in | Wt 163.8 lb

## 2018-02-02 DIAGNOSIS — M9903 Segmental and somatic dysfunction of lumbar region: Secondary | ICD-10-CM | POA: Diagnosis not present

## 2018-02-02 DIAGNOSIS — E559 Vitamin D deficiency, unspecified: Secondary | ICD-10-CM | POA: Diagnosis not present

## 2018-02-02 DIAGNOSIS — K219 Gastro-esophageal reflux disease without esophagitis: Secondary | ICD-10-CM | POA: Diagnosis not present

## 2018-02-02 DIAGNOSIS — E039 Hypothyroidism, unspecified: Secondary | ICD-10-CM | POA: Diagnosis not present

## 2018-02-02 DIAGNOSIS — M955 Acquired deformity of pelvis: Secondary | ICD-10-CM | POA: Diagnosis not present

## 2018-02-02 DIAGNOSIS — M9905 Segmental and somatic dysfunction of pelvic region: Secondary | ICD-10-CM | POA: Diagnosis not present

## 2018-02-02 DIAGNOSIS — M5416 Radiculopathy, lumbar region: Secondary | ICD-10-CM | POA: Diagnosis not present

## 2018-02-02 DIAGNOSIS — E061 Subacute thyroiditis: Secondary | ICD-10-CM | POA: Diagnosis not present

## 2018-02-02 NOTE — Progress Notes (Signed)
Wyline MoodKiran Jaliyah Fotheringham MD, MRCP(U.K) [address]  [address]  [place], [place] [address]  Main: [phone]  Fax: [phone]   [facility] Physician[facility]  Primary Gastroenterologist:  Dr. Wyline MoodKiran Carlus Stay   Chief Complaint  Patient presents with  . Gastroesophageal Reflux    HPI: Diane Nearingracey A Irby is a 32 y.o. female    Summary of history :  She was initially referred for GERD . She was seen by Dr Aleen CampiPiscoya on 08/07/17 for evaluation of chololithiasis. She had epigastric pain in 06/2017 while eating pizza and wings . Because the pain was severe she went and was seen by her PCP who checked her LFT's and noted elevated transaminases , ultrasound showed gall stones,CBD 3 mm . She subsequently had a repeat LFT's in 07/2017 and it showed resolution of her LFT's . She had a discussion about cholecystectomy and initially was scheduled but later cancelled.At her last visit she requested a third opinion and was seen by Dr Daphine DeutscherMartin at [place].  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   Denies any pain or discomfort. Feels much better after surgery. Stopped all her reflux meds and feels fine.      Current Outpatient Medications  Medication Sig Dispense Refill  . levothyroxine (SYNTHROID, LEVOTHROID) 25 MCG tablet Take 1 tablet (25 mcg total) by mouth daily before breakfast. 90 tablet 3  . norethindrone (MICRONOR,CAMILA,ERRIN) 0.35 MG tablet Take 1 tablet (0.35 mg total) by mouth daily. 3 Package 4  . Prenatal Vit-Fe Fumarate-FA (PRENATAL MULTIVITAMIN) TABS tablet Take 1 tablet by mouth daily.     Marland Kitchen. albuterol (PROAIR HFA) 108 (90 Base) MCG/ACT inhaler Inhale 2 puffs into the lungs every 6 (six) hours as needed for wheezing or shortness of breath. (Patient not taking: Reported on 11/28/2017) 1 Inhaler 2  . ibuprofen (ADVIL,MOTRIN) 200 MG tablet Take 400 mg by mouth every 6 (six) hours as needed for  headache.     No current facility-administered medications for this visit.     Allergies as of 02/02/2018  . (No Known Allergies)    ROS:  General: Negative for anorexia, weight loss, fever, chills, fatigue, weakness. ENT: Negative for hoarseness, difficulty swallowing , nasal congestion. CV: Negative for chest pain, angina, palpitations, dyspnea on exertion, peripheral edema.  Respiratory: Negative for dyspnea at rest, dyspnea on exertion, cough, sputum, wheezing.  GI: See history of present illness. GU:  Negative for dysuria, hematuria, urinary incontinence, urinary frequency, nocturnal urination.  Endo: Negative for unusual weight change.    Physical Examination:   BP 118/66   Pulse 94   Ht 5\' 5"  (1.651 m)   Wt 163 lb 12.8 oz (74.3 kg)   BMI 27.26 kg/m   General: Well-nourished, well-developed in no acute distress.  Eyes: No icterus. Conjunctivae pink. Mouth: Oropharyngeal mucosa moist and pink , no lesions erythema or exudate. Lungs: Clear to auscultation bilaterally. Non-labored. Heart: Regular rate and rhythm, no murmurs rubs or gallops.  Abdomen: Bowel sounds are normal, nontender, nondistended, no hepatosplenomegaly or masses, no abdominal bruits or hernia , no rebound or guarding.   Extremities: No lower extremity edema. No clubbing or deformities. Neuro: Alert and oriented x 3.  Grossly intact. Skin: Warm and dry, no jaundice.   Psych: Alert and cooperative, normal mood and affect.   Imaging Studies: No results found.  Assessment and Plan:   Diane [name] is a 32 y.o. y/o female here to follow  up for GERD and biliary colic , s/p cholecystectomy.    Plan  1. D/c zantac,PPI  Dr Wyline MoodKiran Eloni Darius  MD,MRCP [facility](U.K) Follow up PRN

## 2018-02-03 ENCOUNTER — Encounter: Payer: Self-pay | Admitting: Medical

## 2018-02-03 ENCOUNTER — Ambulatory Visit: Payer: BLUE CROSS/BLUE SHIELD | Admitting: Medical

## 2018-02-03 VITALS — BP 112/62 | HR 67 | Temp 98.6°F | Resp 18 | Ht 65.0 in | Wt 163.6 lb

## 2018-02-03 DIAGNOSIS — J069 Acute upper respiratory infection, unspecified: Secondary | ICD-10-CM

## 2018-02-03 DIAGNOSIS — J0100 Acute maxillary sinusitis, unspecified: Secondary | ICD-10-CM

## 2018-02-03 LAB — CYTOLOGY - PAP

## 2018-02-03 LAB — THYROID PANEL WITH TSH
Free Thyroxine Index: 1.1 — ABNORMAL LOW (ref 1.2–4.9)
T3 UPTAKE RATIO: 28 % (ref 24–39)
T4 TOTAL: 4 ug/dL — AB (ref 4.5–12.0)
TSH: 14.44 u[IU]/mL — AB (ref 0.450–4.500)

## 2018-02-03 LAB — THYROID ANTIBODIES
Thyroglobulin Antibody: 4.5 IU/mL — ABNORMAL HIGH (ref 0.0–0.9)
Thyroperoxidase Ab SerPl-aCnc: 26 IU/mL (ref 0–34)

## 2018-02-03 LAB — VITAMIN D 25 HYDROXY (VIT D DEFICIENCY, FRACTURES): Vit D, 25-Hydroxy: 25 ng/mL — ABNORMAL LOW (ref 30.0–100.0)

## 2018-02-03 MED ORDER — AMOXICILLIN 875 MG PO TABS: 875.0000 mg | ORAL_TABLET | Freq: Two times a day (BID) | ORAL | 0 refills | Status: DC

## 2018-02-03 NOTE — Progress Notes (Signed)
   Subjective:    Patient ID: Diane [name], female    DOB: [birth year], 32 y.o.   MRN: [id number]  HPI 32 yo female in non acute distress. Started with symptoms on Thursday with sore throat. Nasal congestin with runny nose , clear but creamy yellow at night.denies fever or chills  Blood pressure 112/62, pulse 67, temperature 98.6 F (37 C), resp. rate 18, height 5\' 5"  (1.651 m), weight 163 lb 9.6 oz (74.2 kg), currently breastfeeding.   Review of Systems  Constitutional: Negative for chills and fever.  HENT: Positive for congestion, ear pain (left > right ), postnasal drip, rhinorrhea (no color), sinus pressure, sinus pain (maxillary area), sneezing and sore throat (better some). Negative for facial swelling.   Respiratory: Positive for cough. Negative for shortness of breath.   Cardiovascular: Negative for chest pain.  Gastrointestinal: Negative for abdominal pain.  Endocrine: Negative for polydipsia, polyphagia and polyuria.  Genitourinary: Negative for dysuria.  Musculoskeletal: Negative for myalgias.  Skin: Negative for rash.  Allergic/Immunologic: Negative for environmental allergies and food allergies.  Neurological: Negative for dizziness, syncope and light-headedness.  Hematological: Negative for adenopathy.  Psychiatric/Behavioral: Negative for behavioral problems, confusion, self-injury and suicidal ideas.      currently breast feeding. Objective:   Physical Exam  Constitutional: She is oriented to person, place, and time. She appears well-developed and well-nourished.  HENT:  Head: Normocephalic and atraumatic.  Right Ear: Hearing, external ear and ear canal normal. A middle ear effusion is present.  Left Ear: Hearing, external ear and ear canal normal. Tympanic membrane is erythematous (base of tm). A middle ear effusion is present.  Nose: Mucosal edema and rhinorrhea present.  Mouth/Throat: Uvula is midline and mucous membranes are normal. Uvula swelling (mild)  present. Posterior oropharyngeal erythema (mild) present. Tonsils are 0 on the right. Tonsils are 0 on the left. No tonsillar exudate.  Eyes: Pupils are equal, round, and reactive to light. Conjunctivae and EOM are normal.  Neck: Normal range of motion. Neck supple.  Cardiovascular: Normal rate, regular rhythm and normal heart sounds.  Pulmonary/Chest: Effort normal and breath sounds normal.  Lymphadenopathy:    She has no cervical adenopathy.  Neurological: She is alert and oriented to person, place, and time.  Skin: Skin is warm and dry.  Psychiatric: She has a normal mood and affect. Her behavior is normal. Judgment and thought content normal.  Nursing note reviewed.  Erythema and yellow discharge in left nare, mild turbinate swelling. Clearing throat in room. alot    Assessment & Plan:  Sinusitis, URI Meds ordered this encounter  Medications  . amoxicillin (AMOXIL) 875 MG tablet    Sig: Take 1 tablet (875 mg total) by mouth 2 (two) times daily.    Dispense:  20 tablet    Refill:  0  Patient still breast feeding. Recommended Claritin, however this can dry up her milk so informed patient to decided if she wants to try it.Marland Kitchen. She would like to breast feed for a year and baby is only 567 months old.  ( brought Lily 33yo and CozadBennett 7 months old ) to the clinic. Patient verbalizes understanding and has no questions at discharge, declines AVS. If not improving in 3-5 days to return to the clinic .

## 2018-02-04 ENCOUNTER — Other Ambulatory Visit: Payer: Self-pay | Admitting: *Deleted

## 2018-02-04 ENCOUNTER — Encounter: Payer: Self-pay | Admitting: Endocrinology

## 2018-02-04 DIAGNOSIS — M5416 Radiculopathy, lumbar region: Secondary | ICD-10-CM | POA: Diagnosis not present

## 2018-02-04 DIAGNOSIS — M955 Acquired deformity of pelvis: Secondary | ICD-10-CM | POA: Diagnosis not present

## 2018-02-04 DIAGNOSIS — M9905 Segmental and somatic dysfunction of pelvic region: Secondary | ICD-10-CM | POA: Diagnosis not present

## 2018-02-04 DIAGNOSIS — M9903 Segmental and somatic dysfunction of lumbar region: Secondary | ICD-10-CM | POA: Diagnosis not present

## 2018-02-04 MED ORDER — VITAMIN D (ERGOCALCIFEROL) 1.25 MG (50000 UNIT) PO CAPS: 50000.0000 [IU] | ORAL_CAPSULE | ORAL | 4 refills | Status: DC

## 2018-02-05 ENCOUNTER — Other Ambulatory Visit: Payer: Self-pay | Admitting: Endocrinology

## 2018-02-05 ENCOUNTER — Encounter: Payer: Self-pay | Admitting: Podiatry

## 2018-02-05 ENCOUNTER — Ambulatory Visit (INDEPENDENT_AMBULATORY_CARE_PROVIDER_SITE_OTHER): Payer: BLUE CROSS/BLUE SHIELD | Admitting: Podiatry

## 2018-02-05 ENCOUNTER — Ambulatory Visit: Payer: Self-pay

## 2018-02-05 VITALS — BP 104/66 | HR 69

## 2018-02-05 DIAGNOSIS — M9903 Segmental and somatic dysfunction of lumbar region: Secondary | ICD-10-CM | POA: Diagnosis not present

## 2018-02-05 DIAGNOSIS — M722 Plantar fascial fibromatosis: Secondary | ICD-10-CM

## 2018-02-05 DIAGNOSIS — M216X1 Other acquired deformities of right foot: Secondary | ICD-10-CM

## 2018-02-05 DIAGNOSIS — D485 Neoplasm of uncertain behavior of skin: Secondary | ICD-10-CM | POA: Diagnosis not present

## 2018-02-05 DIAGNOSIS — M5416 Radiculopathy, lumbar region: Secondary | ICD-10-CM | POA: Diagnosis not present

## 2018-02-05 DIAGNOSIS — D2239 Melanocytic nevi of other parts of face: Secondary | ICD-10-CM | POA: Diagnosis not present

## 2018-02-05 DIAGNOSIS — D2260 Melanocytic nevi of unspecified upper limb, including shoulder: Secondary | ICD-10-CM | POA: Diagnosis not present

## 2018-02-05 DIAGNOSIS — M216X2 Other acquired deformities of left foot: Secondary | ICD-10-CM | POA: Diagnosis not present

## 2018-02-05 DIAGNOSIS — S93609A Unspecified sprain of unspecified foot, initial encounter: Secondary | ICD-10-CM | POA: Diagnosis not present

## 2018-02-05 DIAGNOSIS — D2270 Melanocytic nevi of unspecified lower limb, including hip: Secondary | ICD-10-CM | POA: Diagnosis not present

## 2018-02-05 DIAGNOSIS — M955 Acquired deformity of pelvis: Secondary | ICD-10-CM | POA: Diagnosis not present

## 2018-02-05 DIAGNOSIS — D225 Melanocytic nevi of trunk: Secondary | ICD-10-CM | POA: Diagnosis not present

## 2018-02-05 DIAGNOSIS — Z1283 Encounter for screening for malignant neoplasm of skin: Secondary | ICD-10-CM | POA: Diagnosis not present

## 2018-02-05 DIAGNOSIS — M9905 Segmental and somatic dysfunction of pelvic region: Secondary | ICD-10-CM | POA: Diagnosis not present

## 2018-02-05 MED ORDER — LEVOTHYROXINE SODIUM 50 MCG PO TABS: 50.0000 ug | ORAL_TABLET | Freq: Every day | ORAL | 3 refills | Status: DC

## 2018-02-05 NOTE — Progress Notes (Signed)
This patient presents the office with chief complaint of generalized pain through the bottom and top of both of her feet.  She says she has pain that runs from the outside of her feet over to the ball of both feet.  She says this gets aggravated by activity and has been going on for 2 year.  She says the pain seems to be getting worse.  She denies any history of trauma or injury to the foot.  She has provided no self treatment nor sought any professional help.  She presents the office believing she may have plantar fasciitis since her mother has a history of plantar fasciitis. Patient states her pain at its worst is 4 out of 10.  General Appearance  Alert, conversant and in no acute stress.  Vascular  Dorsalis pedis and posterior tibial  pulses are palpable  bilaterally.  Capillary return is within normal limits  bilaterally. Temperature is within normal limits  bilaterally.  Neurologic  Senn-Weinstein monofilament wire test within normal limits  bilaterally. Muscle power within normal limits bilaterally.  Nails Normal nails noted with no evidence of bacterial or fungal infection.  Orthopedic  No limitations of motion of motion feet .  No crepitus or effusions noted.  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  Dorsal lateral exostosis first metatarsal is noted.  Patient has excessive pronation upon weight-bearing.  No palpable pain noted through the plantar fascia nor the base of the fifth metatarsal bilaterally. Significant hypermobility feet  B/L.  Skin  normotropic skin with no porokeratosis noted bilaterally.  No signs of infections or ulcers noted.  Callus noted sub-fifth metatarsal right foot.    Plantar fasciits  B/L.  Cuboid syndrome  B/L  Excessive pronation  B/LTold her that due to her excessive pronation and hypermobility,   Initial exam.  X-rays taken of the feet  reveal no bony pathology.  Discussed this condition with this patient,   She has developed pain  along the outside of her foot  and through the forefoot of both feet..  Told this patient  she would benefit from wearing power step insoles to control the excessive motion in her feet.  This would help to improve her feet and resolve the pain.  She is a candidate for orthoses in the future. RTC prn.   Helane GuntherGregory Janitza Revuelta DPM

## 2018-02-09 DIAGNOSIS — M9905 Segmental and somatic dysfunction of pelvic region: Secondary | ICD-10-CM | POA: Diagnosis not present

## 2018-02-09 DIAGNOSIS — M9903 Segmental and somatic dysfunction of lumbar region: Secondary | ICD-10-CM | POA: Diagnosis not present

## 2018-02-09 DIAGNOSIS — M955 Acquired deformity of pelvis: Secondary | ICD-10-CM | POA: Diagnosis not present

## 2018-02-09 DIAGNOSIS — M5416 Radiculopathy, lumbar region: Secondary | ICD-10-CM | POA: Diagnosis not present

## 2018-02-11 DIAGNOSIS — M955 Acquired deformity of pelvis: Secondary | ICD-10-CM | POA: Diagnosis not present

## 2018-02-11 DIAGNOSIS — M5416 Radiculopathy, lumbar region: Secondary | ICD-10-CM | POA: Diagnosis not present

## 2018-02-11 DIAGNOSIS — M9905 Segmental and somatic dysfunction of pelvic region: Secondary | ICD-10-CM | POA: Diagnosis not present

## 2018-02-11 DIAGNOSIS — M9903 Segmental and somatic dysfunction of lumbar region: Secondary | ICD-10-CM | POA: Diagnosis not present

## 2018-02-12 DIAGNOSIS — D2239 Melanocytic nevi of other parts of face: Secondary | ICD-10-CM | POA: Diagnosis not present

## 2018-02-12 DIAGNOSIS — M9905 Segmental and somatic dysfunction of pelvic region: Secondary | ICD-10-CM | POA: Diagnosis not present

## 2018-02-12 DIAGNOSIS — M5416 Radiculopathy, lumbar region: Secondary | ICD-10-CM | POA: Diagnosis not present

## 2018-02-12 DIAGNOSIS — L905 Scar conditions and fibrosis of skin: Secondary | ICD-10-CM | POA: Diagnosis not present

## 2018-02-12 DIAGNOSIS — M955 Acquired deformity of pelvis: Secondary | ICD-10-CM | POA: Diagnosis not present

## 2018-02-12 DIAGNOSIS — M9903 Segmental and somatic dysfunction of lumbar region: Secondary | ICD-10-CM | POA: Diagnosis not present

## 2018-02-12 DIAGNOSIS — Z4802 Encounter for removal of sutures: Secondary | ICD-10-CM | POA: Diagnosis not present

## 2018-02-17 DIAGNOSIS — M955 Acquired deformity of pelvis: Secondary | ICD-10-CM | POA: Diagnosis not present

## 2018-02-17 DIAGNOSIS — M9905 Segmental and somatic dysfunction of pelvic region: Secondary | ICD-10-CM | POA: Diagnosis not present

## 2018-02-17 DIAGNOSIS — M9903 Segmental and somatic dysfunction of lumbar region: Secondary | ICD-10-CM | POA: Diagnosis not present

## 2018-02-17 DIAGNOSIS — M5416 Radiculopathy, lumbar region: Secondary | ICD-10-CM | POA: Diagnosis not present

## 2018-02-18 ENCOUNTER — Encounter: Payer: Self-pay | Admitting: Primary Care

## 2018-02-18 ENCOUNTER — Ambulatory Visit (INDEPENDENT_AMBULATORY_CARE_PROVIDER_SITE_OTHER): Payer: BLUE CROSS/BLUE SHIELD | Admitting: Primary Care

## 2018-02-18 ENCOUNTER — Ambulatory Visit (INDEPENDENT_AMBULATORY_CARE_PROVIDER_SITE_OTHER)
Admission: RE | Admit: 2018-02-18 | Discharge: 2018-02-18 | Disposition: A | Discharge status: Home / Self Care | Payer: BLUE CROSS/BLUE SHIELD | Source: Ambulatory Visit | Attending: Primary Care | Admitting: Primary Care

## 2018-02-18 VITALS — BP 114/70 | HR 68 | Temp 98.2°F | Ht 65.0 in | Wt 162.5 lb

## 2018-02-18 DIAGNOSIS — E039 Hypothyroidism, unspecified: Secondary | ICD-10-CM | POA: Diagnosis not present

## 2018-02-18 DIAGNOSIS — R2231 Localized swelling, mass and lump, right upper limb: Secondary | ICD-10-CM

## 2018-02-18 DIAGNOSIS — M79641 Pain in right hand: Secondary | ICD-10-CM | POA: Diagnosis not present

## 2018-02-18 DIAGNOSIS — J4520 Mild intermittent asthma, uncomplicated: Secondary | ICD-10-CM | POA: Diagnosis not present

## 2018-02-18 NOTE — Progress Notes (Signed)
Subjective:    Patient ID: Diane [name], female    DOB: December 28, 1985, 32 y.o.   MRN: [id number]  HPI  Diane [name] is a 32 year old female who presents today to establish care and discuss the problems mentioned below. Will obtain old records. She following with GYN and endocrinology.  1) Thyroiditis: Currently managed on levothyroxine 50 mcg. She is currently following with Dr. Everardo AllEllison with endocrinology.   2) Exercise Induced Asthma: Diagnosed during childhood. Uses albuterol inhaler as needed prior to exercise.   3) Nodule: Located to the right palmer hand proximal to MCP of 5th digit. She's noticed this about one month ago. Some discomfort with pressure. She denies changes in size or color.   Review of Systems  Respiratory: Negative for shortness of breath and wheezing.   Cardiovascular: Negative for chest pain.  Endocrine: Negative for cold intolerance.  Skin:       Right hand mass       Past Medical History:  Diagnosis Date  . Anxiety   . Asthma    activity induced asthma  . Endometriosis   . Gall stones 07/30/2017  . GERD (gastroesophageal reflux disease)   . Gestational diabetes mellitus   . Hypothyroidism   . Ovarian cyst   . Stress incontinence 04/16/2017  . Thyroiditis   . 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 Marital status: Married    Spouse name: Not on file  . Number of children: Not on file  . Years of education: Not on file  . Highest education level: Not on file  Occupational History  . Not on file  Social Needs  . Financial resource strain: Not on file  . Food insecurity:    Worry: Not on file    Inability: Not on file  . Transportation needs:    Medical: Not on file    Non-medical: Not on file  Tobacco Use  . Smoking status: Never Smoker  . Smokeless tobacco: Never Used  Substance and Sexual Activity  . Alcohol use: No  . Drug use: No  . Sexual activity: Yes    Birth control/protection:  Pill  Lifestyle  . Physical activity:    Days per week: Not on file    Minutes per session: Not on file  . Stress: Not on file  Relationships  . Social connections:    Talks on phone: Not on file    Gets together: Not on file    Attends religious service: Not on file    Active member of club or organization: Not on file    Attends meetings of clubs or organizations: Not on file    Relationship status: Not on file  . Intimate partner violence:    Fear of current or ex partner: Not on file    Emotionally abused: Not on file    Physically abused: Not on file    Forced sexual activity: Not on file  Other Topics Concern  . Not on file  Social History Narrative   Married.   2 children.   Masters in Programme researcher, broadcasting/film/videoducation.   Enjoys reading, photography, crafting.     Past Surgical History:  Procedure Laterality Date  . CHOLECYSTECTOMY N/A 11/28/2017   Procedure: LAPAROSCOPIC CHOLECYSTECTOMY WITH INTRAOPERATIVE CHOLANGIOGRAM;  Surgeon: Luretha MurphyMartin, Matthew, MD;  Location: [facility] ORS;  Service: General;  Laterality: N/A;  . LAPAROSCOPY  2009  . nsvd  [id number]   x 2    Family  History  Problem Relation Age of Onset  . Diabetes Father   . Asthma Father   . Hypertension Father   . Heart disease Maternal Grandmother   . Colon cancer Maternal Grandmother   . Breast cancer Maternal Grandmother   . Heart disease Paternal Grandfather   . Pancreatic cancer Paternal Grandmother   . Thyroid disease Neg Hx     No Known Allergies  Current Outpatient Medications on File Prior to Visit  Medication Sig Dispense Refill  . albuterol (PROAIR HFA) 108 (90 Base) MCG/ACT inhaler Inhale 2 puffs into the lungs every 6 (six) hours as needed for wheezing or shortness of breath. 1 Inhaler 2  . Lactobacillus Rhamnosus, GG, (CULTURELLE PO) Take by mouth daily.    Marland Kitchen levothyroxine (SYNTHROID, LEVOTHROID) 50 MCG tablet Take 1 tablet (50 mcg total) by mouth daily before breakfast. 30 tablet 3  . norethindrone  (MICRONOR,CAMILA,ERRIN) 0.35 MG tablet Take 1 tablet (0.35 mg total) by mouth daily. 3 Package 4  . Prenatal Vit-Fe Fumarate-FA (PRENATAL MULTIVITAMIN) TABS tablet Take 1 tablet by mouth daily.     . Vitamin D, Ergocalciferol, (DRISDOL) 50000 units CAPS capsule Take 1 capsule (50,000 Units total) by mouth every 7 (seven) days. 12 capsule 4   No current facility-administered medications on file prior to visit.     BP 114/70   Pulse 68   Temp 98.2 F (36.8 C) (Oral)   Ht 5\' 5"  (1.651 m)   Wt 162 lb 8 oz (73.7 kg)   LMP  (LMP Unknown)   SpO2 99%   Breastfeeding? Yes   BMI 27.04 kg/m    Objective:   Physical Exam  Constitutional: She appears well-nourished.  Neck: Neck supple.  Cardiovascular: Normal rate and regular rhythm.  Respiratory: Effort normal and breath sounds normal.  Skin: Skin is warm and dry.  0.25 cm firm, immobile, non tender mass to right palmer hand proximal to 5th MCP joint.   Psychiatric: She has a normal mood and affect.           Assessment & Plan:

## 2018-02-18 NOTE — Patient Instructions (Signed)
Complete xray(s) prior to leaving today. I will notify you of your results once received.  It was a pleasure to meet you today! Please don't hesitate to call or message me with any questions. Welcome to [facility]!

## 2018-02-19 DIAGNOSIS — M955 Acquired deformity of pelvis: Secondary | ICD-10-CM | POA: Diagnosis not present

## 2018-02-19 DIAGNOSIS — M5416 Radiculopathy, lumbar region: Secondary | ICD-10-CM | POA: Diagnosis not present

## 2018-02-19 DIAGNOSIS — M9903 Segmental and somatic dysfunction of lumbar region: Secondary | ICD-10-CM | POA: Diagnosis not present

## 2018-02-19 DIAGNOSIS — M9905 Segmental and somatic dysfunction of pelvic region: Secondary | ICD-10-CM | POA: Diagnosis not present

## 2018-02-20 DIAGNOSIS — R2231 Localized swelling, mass and lump, right upper limb: Secondary | ICD-10-CM

## 2018-02-20 DIAGNOSIS — J45909 Unspecified asthma, uncomplicated: Secondary | ICD-10-CM | POA: Insufficient documentation

## 2018-02-20 HISTORY — DX: Localized swelling, mass and lump, right upper limb: R22.31

## 2018-02-20 NOTE — Assessment & Plan Note (Signed)
Exercise induced and using albuterol PRN. No wheezing on exam.

## 2018-02-20 NOTE — Assessment & Plan Note (Signed)
Following with endocrinology, continue levothyroxine 50 mcg.

## 2018-02-20 NOTE — Assessment & Plan Note (Signed)
Appears benign. Xray without obvious cause for mass., ultrasound pending.

## 2018-02-24 DIAGNOSIS — M5416 Radiculopathy, lumbar region: Secondary | ICD-10-CM | POA: Diagnosis not present

## 2018-02-24 DIAGNOSIS — M9905 Segmental and somatic dysfunction of pelvic region: Secondary | ICD-10-CM | POA: Diagnosis not present

## 2018-02-24 DIAGNOSIS — M9903 Segmental and somatic dysfunction of lumbar region: Secondary | ICD-10-CM | POA: Diagnosis not present

## 2018-02-24 DIAGNOSIS — M955 Acquired deformity of pelvis: Secondary | ICD-10-CM | POA: Diagnosis not present

## 2018-02-25 ENCOUNTER — Ambulatory Visit (INDEPENDENT_AMBULATORY_CARE_PROVIDER_SITE_OTHER): Payer: BLUE CROSS/BLUE SHIELD | Admitting: Endocrinology

## 2018-02-25 ENCOUNTER — Encounter: Payer: Self-pay | Admitting: Endocrinology

## 2018-02-25 VITALS — BP 102/78 | HR 77 | Wt 161.8 lb

## 2018-02-25 DIAGNOSIS — E039 Hypothyroidism, unspecified: Secondary | ICD-10-CM | POA: Diagnosis not present

## 2018-02-25 LAB — TSH: TSH: 3.58 u[IU]/mL (ref 0.35–4.50)

## 2018-02-25 LAB — T4, FREE: FREE T4: 0.72 ng/dL (ref 0.60–1.60)

## 2018-02-25 NOTE — Patient Instructions (Addendum)
blood tests are requested for you today.  We'll let you know about the results.   Please come back for a follow-up appointment in 6 months.   We can check the blood tests in between if you want.   In view of your medical condition, you should avoid another pregnancy until we have decided it is safe.   

## 2018-02-25 NOTE — Progress Notes (Signed)
Subjective:    Patient ID: Diane [name], female    DOB: 1986-05-22, 32 y.o.   MRN: [id number]  HPI Pt returns for f/u of subacute thyroiditis (dx'ed mid-2016, when she presented with hyprethyroidism; she has never had thyroid imaging; she had a child in early 2016; she was rx'ed synthroid in late 2016).  pt states she feels well in general, except for GERD sxs.  She is now 8 mos postpartum.  pt states she feels well in general, except for fatigue.   Past Medical History:  Diagnosis Date  . Anxiety   . Asthma    activity induced asthma  . Endometriosis   . Gall stones 07/30/2017  . GERD (gastroesophageal reflux disease)   . Gestational diabetes mellitus   . Hypothyroidism   . Ovarian cyst   . Stress incontinence 04/16/2017  . Thyroiditis   . 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 CHOLECYSTECTOMY N/A 11/28/2017   Procedure: LAPAROSCOPIC CHOLECYSTECTOMY WITH INTRAOPERATIVE CHOLANGIOGRAM;  Surgeon: Luretha Murphy, MD;  Location: [facility] ORS;  Service: General;  Laterality: N/A;  . LAPAROSCOPY  2009  . nsvd  [id number]   x 2    Social History   Socioeconomic History  . Marital status: Married    Spouse name: Not on file  . Number of children: Not on file  . Years of education: Not on file  . Highest education level: Not on file  Occupational History  . Not on file  Social Needs  . Financial resource strain: Not on file  . Food insecurity:    Worry: Not on file    Inability: Not on file  . Transportation needs:    Medical: Not on file    Non-medical: Not on file  Tobacco Use  . Smoking status: Never Smoker  . Smokeless tobacco: Never Used  Substance and Sexual Activity  . Alcohol use: No  . Drug use: No  . Sexual activity: Yes    Birth control/protection: Pill  Lifestyle  . Physical activity:    Days per week: Not on file    Minutes per session: Not on file  . Stress: Not on file  Relationships  . Social  connections:    Talks on phone: Not on file    Gets together: Not on file    Attends religious service: Not on file    Active member of club or organization: Not on file    Attends meetings of clubs or organizations: Not on file    Relationship status: Not on file  . Intimate partner violence:    Fear of current or ex partner: Not on file    Emotionally abused: Not on file    Physically abused: Not on file    Forced sexual activity: Not on file  Other Topics Concern  . Not on file  Social History Narrative   Married.   2 children.   Masters in Programme researcher, broadcasting/film/video.   Enjoys reading, photography, crafting.     Current Outpatient Medications on File Prior to Visit  Medication Sig Dispense Refill  . albuterol (PROAIR HFA) 108 (90 Base) MCG/ACT inhaler Inhale 2 puffs into the lungs every 6 (six) hours as needed for wheezing or shortness of breath. 1 Inhaler 2  . Lactobacillus Rhamnosus, GG, (CULTURELLE PO) Take by mouth daily.    Marland Kitchen levothyroxine (SYNTHROID, LEVOTHROID) 50 MCG tablet Take 1 tablet (50 mcg total) by mouth daily before breakfast. 30 tablet 3  .  norethindrone (MICRONOR,CAMILA,ERRIN) 0.35 MG tablet Take 1 tablet (0.35 mg total) by mouth daily. 3 Package 4  . Prenatal Vit-Fe Fumarate-FA (PRENATAL MULTIVITAMIN) TABS tablet Take 1 tablet by mouth daily.     . Vitamin D, Ergocalciferol, (DRISDOL) 50000 units CAPS capsule Take 1 capsule (50,000 Units total) by mouth every 7 (seven) days. 12 capsule 4   No current facility-administered medications on file prior to visit.     No Known Allergies  Family History  Problem Relation Age of Onset  . Diabetes Father   . Asthma Father   . Hypertension Father   . Heart disease Maternal Grandmother   . Colon cancer Maternal Grandmother   . Breast cancer Maternal Grandmother   . Heart disease Paternal Grandfather   . Pancreatic cancer Paternal Grandmother   . Thyroid disease Neg Hx     BP 102/78 (BP Location: Left Arm, Patient Position:  Sitting, Cuff Size: Normal)   Pulse 77   Wt 161 lb 12.8 oz (73.4 kg)   LMP  (LMP Unknown)   SpO2 96%   BMI 26.92 kg/m   Review of Systems She has lost more weight.      Objective:   Physical Exam VITAL SIGNS:  See vs page GENERAL: no distress NECK: thyroid is approx twice normal size, with irreg surface, but no palpable nodule.    Lab Results  Component Value Date   TSH 3.58 02/25/2018   T4TOTAL 4.0 (L) 02/02/2018      Assessment & Plan:  Hypothyroidism: well-replaced Chronic autoimmune thyroiditis.  I told pt that thyroid function was continue to fluctuate, but the long term is usually chronic hypothyroidism  Patient Instructions  blood tests are requested for you today.  We'll let you know about the results.   Please come back for a follow-up appointment in 6 months.   We can check the blood tests in between if you want.   In view of your medical condition, you should avoid another pregnancy until we have decided it is safe

## 2018-02-26 DIAGNOSIS — M5416 Radiculopathy, lumbar region: Secondary | ICD-10-CM | POA: Diagnosis not present

## 2018-02-26 DIAGNOSIS — M9903 Segmental and somatic dysfunction of lumbar region: Secondary | ICD-10-CM | POA: Diagnosis not present

## 2018-02-26 DIAGNOSIS — M955 Acquired deformity of pelvis: Secondary | ICD-10-CM | POA: Diagnosis not present

## 2018-02-26 DIAGNOSIS — M9905 Segmental and somatic dysfunction of pelvic region: Secondary | ICD-10-CM | POA: Diagnosis not present

## 2018-03-04 DIAGNOSIS — M9903 Segmental and somatic dysfunction of lumbar region: Secondary | ICD-10-CM | POA: Diagnosis not present

## 2018-03-04 DIAGNOSIS — M5416 Radiculopathy, lumbar region: Secondary | ICD-10-CM | POA: Diagnosis not present

## 2018-03-04 DIAGNOSIS — M9905 Segmental and somatic dysfunction of pelvic region: Secondary | ICD-10-CM | POA: Diagnosis not present

## 2018-03-04 DIAGNOSIS — M955 Acquired deformity of pelvis: Secondary | ICD-10-CM | POA: Diagnosis not present

## 2018-03-11 DIAGNOSIS — M5416 Radiculopathy, lumbar region: Secondary | ICD-10-CM | POA: Diagnosis not present

## 2018-03-11 DIAGNOSIS — M9905 Segmental and somatic dysfunction of pelvic region: Secondary | ICD-10-CM | POA: Diagnosis not present

## 2018-03-11 DIAGNOSIS — M9903 Segmental and somatic dysfunction of lumbar region: Secondary | ICD-10-CM | POA: Diagnosis not present

## 2018-03-11 DIAGNOSIS — M955 Acquired deformity of pelvis: Secondary | ICD-10-CM | POA: Diagnosis not present

## 2018-03-18 DIAGNOSIS — M5416 Radiculopathy, lumbar region: Secondary | ICD-10-CM | POA: Diagnosis not present

## 2018-03-18 DIAGNOSIS — M9903 Segmental and somatic dysfunction of lumbar region: Secondary | ICD-10-CM | POA: Diagnosis not present

## 2018-03-18 DIAGNOSIS — M955 Acquired deformity of pelvis: Secondary | ICD-10-CM | POA: Diagnosis not present

## 2018-03-18 DIAGNOSIS — M9905 Segmental and somatic dysfunction of pelvic region: Secondary | ICD-10-CM | POA: Diagnosis not present

## 2018-03-25 DIAGNOSIS — M9905 Segmental and somatic dysfunction of pelvic region: Secondary | ICD-10-CM | POA: Diagnosis not present

## 2018-03-25 DIAGNOSIS — M5416 Radiculopathy, lumbar region: Secondary | ICD-10-CM | POA: Diagnosis not present

## 2018-03-25 DIAGNOSIS — M955 Acquired deformity of pelvis: Secondary | ICD-10-CM | POA: Diagnosis not present

## 2018-03-25 DIAGNOSIS — M9903 Segmental and somatic dysfunction of lumbar region: Secondary | ICD-10-CM | POA: Diagnosis not present

## 2018-04-01 DIAGNOSIS — M9905 Segmental and somatic dysfunction of pelvic region: Secondary | ICD-10-CM | POA: Diagnosis not present

## 2018-04-01 DIAGNOSIS — M9903 Segmental and somatic dysfunction of lumbar region: Secondary | ICD-10-CM | POA: Diagnosis not present

## 2018-04-01 DIAGNOSIS — M955 Acquired deformity of pelvis: Secondary | ICD-10-CM | POA: Diagnosis not present

## 2018-04-01 DIAGNOSIS — M5416 Radiculopathy, lumbar region: Secondary | ICD-10-CM | POA: Diagnosis not present

## 2018-04-08 DIAGNOSIS — M9905 Segmental and somatic dysfunction of pelvic region: Secondary | ICD-10-CM | POA: Diagnosis not present

## 2018-04-08 DIAGNOSIS — M5416 Radiculopathy, lumbar region: Secondary | ICD-10-CM | POA: Diagnosis not present

## 2018-04-08 DIAGNOSIS — M955 Acquired deformity of pelvis: Secondary | ICD-10-CM | POA: Diagnosis not present

## 2018-04-08 DIAGNOSIS — M9903 Segmental and somatic dysfunction of lumbar region: Secondary | ICD-10-CM | POA: Diagnosis not present

## 2018-04-22 DIAGNOSIS — M5416 Radiculopathy, lumbar region: Secondary | ICD-10-CM | POA: Diagnosis not present

## 2018-04-22 DIAGNOSIS — M955 Acquired deformity of pelvis: Secondary | ICD-10-CM | POA: Diagnosis not present

## 2018-04-22 DIAGNOSIS — M9905 Segmental and somatic dysfunction of pelvic region: Secondary | ICD-10-CM | POA: Diagnosis not present

## 2018-04-22 DIAGNOSIS — M9903 Segmental and somatic dysfunction of lumbar region: Secondary | ICD-10-CM | POA: Diagnosis not present

## 2018-05-06 DIAGNOSIS — M5416 Radiculopathy, lumbar region: Secondary | ICD-10-CM | POA: Diagnosis not present

## 2018-05-06 DIAGNOSIS — M9905 Segmental and somatic dysfunction of pelvic region: Secondary | ICD-10-CM | POA: Diagnosis not present

## 2018-05-06 DIAGNOSIS — M9903 Segmental and somatic dysfunction of lumbar region: Secondary | ICD-10-CM | POA: Diagnosis not present

## 2018-05-06 DIAGNOSIS — M955 Acquired deformity of pelvis: Secondary | ICD-10-CM | POA: Diagnosis not present

## 2018-05-14 ENCOUNTER — Encounter: Payer: Self-pay | Admitting: Primary Care

## 2018-05-14 ENCOUNTER — Ambulatory Visit (INDEPENDENT_AMBULATORY_CARE_PROVIDER_SITE_OTHER): Payer: BLUE CROSS/BLUE SHIELD | Admitting: Primary Care

## 2018-05-14 VITALS — BP 108/68 | HR 73 | Temp 98.0°F | Ht 65.0 in | Wt 167.5 lb

## 2018-05-14 DIAGNOSIS — Z23 Encounter for immunization: Secondary | ICD-10-CM

## 2018-05-14 DIAGNOSIS — Z0000 Encounter for general adult medical examination without abnormal findings: Secondary | ICD-10-CM

## 2018-05-14 DIAGNOSIS — E039 Hypothyroidism, unspecified: Secondary | ICD-10-CM

## 2018-05-14 DIAGNOSIS — J4520 Mild intermittent asthma, uncomplicated: Secondary | ICD-10-CM | POA: Diagnosis not present

## 2018-05-14 DIAGNOSIS — Z0001 Encounter for general adult medical examination with abnormal findings: Secondary | ICD-10-CM | POA: Insufficient documentation

## 2018-05-14 DIAGNOSIS — E559 Vitamin D deficiency, unspecified: Secondary | ICD-10-CM

## 2018-05-14 LAB — LIPID PANEL
CHOL/HDL RATIO: 3
Cholesterol: 166 mg/dL (ref 0–200)
HDL: 58.8 mg/dL (ref >39.00)
LDL Cholesterol: 96 mg/dL (ref 0–99)
NonHDL: 107.43
TRIGLYCERIDES: 56 mg/dL (ref 0.0–149.0)
VLDL: 11.2 mg/dL (ref 0.0–40.0)

## 2018-05-14 LAB — VITAMIN D 25 HYDROXY (VIT D DEFICIENCY, FRACTURES): VITD: 42.51 ng/mL (ref 30.00–100.00)

## 2018-05-14 LAB — TSH: TSH: 2.17 u[IU]/mL (ref 0.35–4.50)

## 2018-05-14 NOTE — Assessment & Plan Note (Signed)
Compliant to levothyroxine 50 mcg, takes at night. Discussed to take every morning with water only, no food or other medications for 30 minutes. Repeat TSH pending per patient request. Following with endocrinology.

## 2018-05-14 NOTE — Patient Instructions (Addendum)
Stop by the lab prior to leaving today. I will notify you of your results once received.   Take your levothyroxine every morning with water only. No food or other medications for 30 minutes.   Continue exercising. You should be getting 150 minutes of moderate intensity exercise weekly.  Make sure to eat a healthy diet with vegetables, fruit, whole grains, lean protein.  Ensure you are consuming 64 ounces of water daily.  It was a pleasure to see you today!   Preventive Care 18-39 Years, Female Preventive care refers to lifestyle choices and visits with your health care provider that can promote health and wellness. What does preventive care include?  A yearly physical exam. This is also called an annual well check.  Dental exams once or twice a year.  Routine eye exams. Ask your health care provider how often you should have your eyes checked.  Personal lifestyle choices, including: ? Daily care of your teeth and gums. ? Regular physical activity. ? Eating a healthy diet. ? Avoiding tobacco and drug use. ? Limiting alcohol use. ? Practicing safe sex. ? Taking vitamin and mineral supplements as recommended by your health care provider. What happens during an annual well check? The services and screenings done by your health care provider during your annual well check will depend on your age, overall health, lifestyle risk factors, and family history of disease. Counseling Your health care provider may ask you questions about your:  Alcohol use.  Tobacco use.  Drug use.  Emotional well-being.  Home and relationship well-being.  Sexual activity.  Eating habits.  Work and work Statistician.  Method of birth control.  Menstrual cycle.  Pregnancy history.  Screening You may have the following tests or measurements:  Height, weight, and BMI.  Diabetes screening. This is done by checking your blood sugar (glucose) after you have not eaten for a while  (fasting).  Blood pressure.  Lipid and cholesterol levels. These may be checked every 5 years starting at age 22.  Skin check.  Hepatitis C blood test.  Hepatitis B blood test.  Sexually transmitted disease (STD) testing.  BRCA-related cancer screening. This may be done if you have a family history of breast, ovarian, tubal, or peritoneal cancers.  Pelvic exam and Pap test. This may be done every 3 years starting at age 29. Starting at age 23, this may be done every 5 years if you have a Pap test in combination with an HPV test.  Discuss your test results, treatment options, and if necessary, the need for more tests with your health care provider. Vaccines Your health care provider may recommend certain vaccines, such as:  Influenza vaccine. This is recommended every year.  Tetanus, diphtheria, and acellular pertussis (Tdap, Td) vaccine. You may need a Td booster every 10 years.  Varicella vaccine. You may need this if you have not been vaccinated.  HPV vaccine. If you are 53 or younger, you may need three doses over 6 months.  Measles, mumps, and rubella (MMR) vaccine. You may need at least one dose of MMR. You may also need a second dose.  Pneumococcal 13-valent conjugate (PCV13) vaccine. You may need this if you have certain conditions and were not previously vaccinated.  Pneumococcal polysaccharide (PPSV23) vaccine. You may need one or two doses if you smoke cigarettes or if you have certain conditions.  Meningococcal vaccine. One dose is recommended if you are age 96-21 years and a Market researcher living in a residence hall,  or if you have one of several medical conditions. You may also need additional booster doses.  Hepatitis A vaccine. You may need this if you have certain conditions or if you travel or work in places where you may be exposed to hepatitis A.  Hepatitis B vaccine. You may need this if you have certain conditions or if you travel or work in  places where you may be exposed to hepatitis B.  Haemophilus influenzae type b (Hib) vaccine. You may need this if you have certain risk factors.  Talk to your health care provider about which screenings and vaccines you need and how often you need them. This information is not intended to replace advice given to you by your health care provider. Make sure you discuss any questions you have with your health care provider. Document Released: 08/06/2001 Document Revised: 02/28/2016 Document Reviewed: 04/11/2015 Elsevier Interactive Patient Education  [employer].

## 2018-05-14 NOTE — Assessment & Plan Note (Signed)
No recent flares or use of albuterol. No wheezing on exam.

## 2018-05-14 NOTE — Addendum Note (Signed)
Addended by: Tawnya CrookSAMBATH, Tanielle Emigh on: 05/14/2018 08:37 AM   Modules accepted: Orders

## 2018-05-14 NOTE — Assessment & Plan Note (Signed)
Tetanus UTD, influenza vaccination provided today. Pap smear UTD. Commended her on regular exercise, encouraged to continue. Encouraged a healthy diet.  Exam unremarkable.  Labs pending. Follow up in 1 year for CPE.

## 2018-05-14 NOTE — Assessment & Plan Note (Signed)
Managed on vitamin D 50,000 units weekly per GYN. Repeat vitamin D pending.

## 2018-05-14 NOTE — Progress Notes (Signed)
Subjective:    Patient ID: Diane [name], female    DOB: 12/06/1985, 32 y.o.   MRN: [id number]  HPI  Diane [name] is a 32 year old female who presents today for complete physical.  Immunizations: -Tetanus: Completed in 2018 -Influenza: Due today  Diet: She endorses a healthy diet Breakfast: Eggs, toast, english muffin, bagel  Lunch: Sandwich, left overs  Dinner: Meat, vegetable, starch  Snacks: Pretzels, fruit  Desserts: Daily Beverages: Coffee, water  Exercise: She is doing spin class once weekly, also does home work out videos 1-2 times weekly  Eye exam: Completed in 2019 Dental exam: Completes semi-annually  Pap Smear: Completed in 2019. Negative for malignancy and HPV   Review of Systems  Constitutional: Negative for unexpected weight change.  HENT: Negative for rhinorrhea.   Respiratory: Negative for cough and shortness of breath.   Cardiovascular: Negative for chest pain.  Gastrointestinal: Negative for constipation and diarrhea.  Genitourinary: Negative for difficulty urinating and menstrual problem.  Musculoskeletal: Negative for arthralgias and myalgias.  Skin: Negative for rash.  Allergic/Immunologic: Negative for environmental allergies.  Neurological: Negative for dizziness, numbness and headaches.  Psychiatric/Behavioral: The patient is not nervous/anxious.        Past Medical History:  Diagnosis Date  . Anxiety   . Asthma    activity induced asthma  . Endometriosis   . Gall stones 07/30/2017  . GERD (gastroesophageal reflux disease)   . Gestational diabetes mellitus   . Hypothyroidism   . Ovarian cyst   . Stress incontinence 04/16/2017  . Thyroiditis   . 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 Marital status: Married    Spouse name: Not on file  . Number of children: Not on file  . Years of education: Not on file  . Highest education level: Not on file  Occupational History  . Not on file    Social Needs  . Financial resource strain: Not on file  . Food insecurity:    Worry: Not on file    Inability: Not on file  . Transportation needs:    Medical: Not on file    Non-medical: Not on file  Tobacco Use  . Smoking status: Never Smoker  . Smokeless tobacco: Never Used  Substance and Sexual Activity  . Alcohol use: No  . Drug use: No  . Sexual activity: Yes    Birth control/protection: Pill  Lifestyle  . Physical activity:    Days per week: Not on file    Minutes per session: Not on file  . Stress: Not on file  Relationships  . Social connections:    Talks on phone: Not on file    Gets together: Not on file    Attends religious service: Not on file    Active member of club or organization: Not on file    Attends meetings of clubs or organizations: Not on file    Relationship status: Not on file  . Intimate partner violence:    Fear of current or ex partner: Not on file    Emotionally abused: Not on file    Physically abused: Not on file    Forced sexual activity: Not on file  Other Topics Concern  . Not on file  Social History Narrative   Married.   2 children.   Masters in Programme researcher, broadcasting/film/videoducation.   Enjoys reading, photography, crafting.     Past Surgical History:  Procedure Laterality Date  . CHOLECYSTECTOMY  N/A 11/28/2017   Procedure: LAPAROSCOPIC CHOLECYSTECTOMY WITH INTRAOPERATIVE CHOLANGIOGRAM;  Surgeon: Luretha Murphy, MD;  Location: [facility] ORS;  Service: General;  Laterality: N/A;  . LAPAROSCOPY  2009  . nsvd  [id number]   x 2    Family History  Problem Relation Age of Onset  . Diabetes Father   . Asthma Father   . Hypertension Father   . Heart disease Maternal Grandmother   . Colon cancer Maternal Grandmother   . Breast cancer Maternal Grandmother   . Heart disease Paternal Grandfather   . Pancreatic cancer Paternal Grandmother   . Thyroid disease Neg Hx     No Known Allergies  Current Outpatient Medications on File Prior to Visit  Medication Sig  Dispense Refill  . albuterol (PROAIR HFA) 108 (90 Base) MCG/ACT inhaler Inhale 2 puffs into the lungs every 6 (six) hours as needed for wheezing or shortness of breath. 1 Inhaler 2  . Lactobacillus Rhamnosus, GG, (CULTURELLE PO) Take by mouth daily.    Marland Kitchen levothyroxine (SYNTHROID, LEVOTHROID) 50 MCG tablet Take 1 tablet (50 mcg total) by mouth daily before breakfast. 30 tablet 3  . norethindrone (MICRONOR,CAMILA,ERRIN) 0.35 MG tablet Take 1 tablet (0.35 mg total) by mouth daily. 3 Package 4  . Prenatal Vit-Fe Fumarate-FA (PRENATAL MULTIVITAMIN) TABS tablet Take 1 tablet by mouth daily.     . Vitamin D, Ergocalciferol, (DRISDOL) 50000 units CAPS capsule Take 1 capsule (50,000 Units total) by mouth every 7 (seven) days. 12 capsule 4   No current facility-administered medications on file prior to visit.     BP 108/68   Pulse 73   Temp 98 F (36.7 C) (Oral)   Ht 5\' 5"  (1.651 m)   Wt 167 lb 8 oz (76 kg)   SpO2 98%   Breastfeeding? Yes   BMI 27.87 kg/m    Objective:   Physical Exam  Constitutional: She is oriented to person, place, and time. She appears well-nourished.  HENT:  Mouth/Throat: No oropharyngeal exudate.  Eyes: Pupils are equal, round, and reactive to light. EOM are normal.  Neck: Neck supple. No thyromegaly present.  Cardiovascular: Normal rate and regular rhythm.  Respiratory: Effort normal and breath sounds normal.  GI: Soft. Bowel sounds are normal. There is no tenderness.  Musculoskeletal: Normal range of motion.  Neurological: She is alert and oriented to person, place, and time.  Skin: Skin is warm and dry.  Psychiatric: She has a normal mood and affect.           Assessment & Plan:

## 2018-05-18 DIAGNOSIS — M9905 Segmental and somatic dysfunction of pelvic region: Secondary | ICD-10-CM | POA: Diagnosis not present

## 2018-05-18 DIAGNOSIS — M9903 Segmental and somatic dysfunction of lumbar region: Secondary | ICD-10-CM | POA: Diagnosis not present

## 2018-05-18 DIAGNOSIS — M5416 Radiculopathy, lumbar region: Secondary | ICD-10-CM | POA: Diagnosis not present

## 2018-05-18 DIAGNOSIS — M955 Acquired deformity of pelvis: Secondary | ICD-10-CM | POA: Diagnosis not present

## 2018-06-03 DIAGNOSIS — M9905 Segmental and somatic dysfunction of pelvic region: Secondary | ICD-10-CM | POA: Diagnosis not present

## 2018-06-03 DIAGNOSIS — M9903 Segmental and somatic dysfunction of lumbar region: Secondary | ICD-10-CM | POA: Diagnosis not present

## 2018-06-03 DIAGNOSIS — M955 Acquired deformity of pelvis: Secondary | ICD-10-CM | POA: Diagnosis not present

## 2018-06-03 DIAGNOSIS — M5416 Radiculopathy, lumbar region: Secondary | ICD-10-CM | POA: Diagnosis not present

## 2018-06-15 DIAGNOSIS — M9903 Segmental and somatic dysfunction of lumbar region: Secondary | ICD-10-CM | POA: Diagnosis not present

## 2018-06-15 DIAGNOSIS — M5416 Radiculopathy, lumbar region: Secondary | ICD-10-CM | POA: Diagnosis not present

## 2018-06-15 DIAGNOSIS — M955 Acquired deformity of pelvis: Secondary | ICD-10-CM | POA: Diagnosis not present

## 2018-06-15 DIAGNOSIS — M9905 Segmental and somatic dysfunction of pelvic region: Secondary | ICD-10-CM | POA: Diagnosis not present

## 2018-07-03 ENCOUNTER — Other Ambulatory Visit: Payer: Self-pay | Admitting: Endocrinology

## 2018-07-31 ENCOUNTER — Encounter: Payer: Self-pay | Admitting: Primary Care

## 2018-07-31 ENCOUNTER — Ambulatory Visit: Payer: BLUE CROSS/BLUE SHIELD | Admitting: Primary Care

## 2018-07-31 DIAGNOSIS — M25532 Pain in left wrist: Secondary | ICD-10-CM

## 2018-07-31 DIAGNOSIS — M25539 Pain in unspecified wrist: Secondary | ICD-10-CM | POA: Insufficient documentation

## 2018-07-31 MED ORDER — NAPROXEN 500 MG PO TABS: 500.0000 mg | ORAL_TABLET | Freq: Two times a day (BID) | ORAL | 0 refills | Status: DC

## 2018-07-31 NOTE — Patient Instructions (Addendum)
Start naproxen 500 mg twice daily with food for inflammation and pain to the left wrist.  Wear a brace to the left wrist when active for support.  You can apply ice for any swelling or discomfort.  Please schedule a follow up visit with Dr. Patsy Lageropland in 3 weeks if your pain persists.  It was a pleasure to see you today!

## 2018-07-31 NOTE — Progress Notes (Signed)
Subjective:    Patient ID: Diane [name], female    DOB: 1986/01/05, 33 y.o.   MRN: [id number]  HPI  Diane [name] is a 33 year old female who presents today with a chief complaint of wrist pain.  Her pain is located to the left medial wrist. Pain is present with rotation of her left hand/wrist. She has noticed pain when playing on her phone, holding a book. Initally her pain was only present during the morning, dissipated during the day. The pain is sharp, will radiate from the wrist to mid forearm.   Denies numbness/tingling, trauma, swelling, erythema. She's not taken anything for her symptoms.  Review of Systems  Musculoskeletal: Positive for arthralgias.  Skin: Negative for color change.  Neurological: Negative for weakness and numbness.       Past Medical History:  Diagnosis Date  . Anxiety   . Asthma    activity induced asthma  . Endometriosis   . Gall stones 07/30/2017  . GERD (gastroesophageal reflux disease)   . Gestational diabetes mellitus   . Hypothyroidism   . Ovarian cyst   . Stress incontinence 04/16/2017  . Thyroiditis   . 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 Marital status: Married    Spouse name: Not on file  . Number of children: Not on file  . Years of education: Not on file  . Highest education level: Not on file  Occupational History  . Not on file  Social Needs  . Financial resource strain: Not on file  . Food insecurity:    Worry: Not on file    Inability: Not on file  . Transportation needs:    Medical: Not on file    Non-medical: Not on file  Tobacco Use  . Smoking status: Never Smoker  . Smokeless tobacco: Never Used  Substance and Sexual Activity  . Alcohol use: No  . Drug use: No  . Sexual activity: Yes    Birth control/protection: Pill  Lifestyle  . Physical activity:    Days per week: Not on file    Minutes per session: Not on file  . Stress: Not on file  Relationships  .  Social connections:    Talks on phone: Not on file    Gets together: Not on file    Attends religious service: Not on file    Active member of club or organization: Not on file    Attends meetings of clubs or organizations: Not on file    Relationship status: Not on file  . Intimate partner violence:    Fear of current or ex partner: Not on file    Emotionally abused: Not on file    Physically abused: Not on file    Forced sexual activity: Not on file  Other Topics Concern  . Not on file  Social History Narrative   Married.   2 children.   Masters in Programme researcher, broadcasting/film/videoducation.   Enjoys reading, photography, crafting.     Past Surgical History:  Procedure Laterality Date  . CHOLECYSTECTOMY N/A 11/28/2017   Procedure: LAPAROSCOPIC CHOLECYSTECTOMY WITH INTRAOPERATIVE CHOLANGIOGRAM;  Surgeon: Luretha MurphyMartin, Matthew, MD;  Location: [facility] ORS;  Service: General;  Laterality: N/A;  . LAPAROSCOPY  2009  . nsvd  [id number]   x 2    Family History  Problem Relation Age of Onset  . Diabetes Father   . Asthma Father   . Hypertension Father   . Heart disease Maternal  Grandmother   . Colon cancer Maternal Grandmother   . Breast cancer Maternal Grandmother   . Heart disease Paternal Grandfather   . Pancreatic cancer Paternal Grandmother   . Thyroid disease Neg Hx     No Known Allergies  Current Outpatient Medications on File Prior to Visit  Medication Sig Dispense Refill  . albuterol (PROAIR HFA) 108 (90 Base) MCG/ACT inhaler Inhale 2 puffs into the lungs every 6 (six) hours as needed for wheezing or shortness of breath. 1 Inhaler 2  . levothyroxine (SYNTHROID, LEVOTHROID) 50 MCG tablet TAKE 1 TABLET(50 MCG) BY MOUTH DAILY BEFORE BREAKFAST 30 tablet 3  . norethindrone (MICRONOR,CAMILA,ERRIN) 0.35 MG tablet Take 1 tablet (0.35 mg total) by mouth daily. 3 Package 4  . Prenatal Vit-Fe Fumarate-FA (PRENATAL MULTIVITAMIN) TABS tablet Take 1 tablet by mouth daily.     . Vitamin D, Ergocalciferol, (DRISDOL) 50000  units CAPS capsule Take 1 capsule (50,000 Units total) by mouth every 7 (seven) days. 12 capsule 4   No current facility-administered medications on file prior to visit.     BP 120/78   Pulse 78   Temp 98.2 F (36.8 C) (Oral)   Ht 5\' 5"  (1.651 m)   Wt 175 lb 12 oz (79.7 kg)   LMP 07/26/2018   SpO2 98%   BMI 29.25 kg/m    Objective:   Physical Exam  Constitutional: She appears well-nourished.  Cardiovascular: Normal rate.  Pulses:      Radial pulses are 2+ on the left side.  Respiratory: Effort normal.  Musculoskeletal:     Left wrist: She exhibits normal range of motion, no tenderness, no bony tenderness and no swelling.       Arms:  Skin: Skin is warm and dry. No erythema.           Assessment & Plan:

## 2018-07-31 NOTE — Assessment & Plan Note (Signed)
Without injury. Exam today overall unremarkable.  Suspect tendonitis given location and characteristics of symptoms. Will have her start with Naproxen 500 mg BID x 7-10 days, ice, brace, rest. Discussed to avoid heavy lifting. We will have her see sports medicine in 3 weeks if no improvement.

## 2018-08-27 ENCOUNTER — Encounter: Payer: Self-pay | Admitting: Endocrinology

## 2018-08-27 ENCOUNTER — Other Ambulatory Visit: Payer: Self-pay

## 2018-08-27 ENCOUNTER — Ambulatory Visit: Payer: BLUE CROSS/BLUE SHIELD | Admitting: Endocrinology

## 2018-08-27 VITALS — BP 100/68 | HR 89 | Ht 65.0 in | Wt 171.6 lb

## 2018-08-27 DIAGNOSIS — E039 Hypothyroidism, unspecified: Secondary | ICD-10-CM

## 2018-08-27 LAB — T4, FREE: FREE T4: 0.77 ng/dL (ref 0.60–1.60)

## 2018-08-27 LAB — TSH: TSH: 1.06 u[IU]/mL (ref 0.35–4.50)

## 2018-08-27 NOTE — Progress Notes (Signed)
Subjective:    Patient ID: Diane [name], female    DOB: April 02, 1986, 33 y.o.   MRN: [id number]  HPI Pt returns for f/u of subacute thyroiditis (dx'ed mid-2016, when she presented with hyprethyroidism; she has never had thyroid imaging; she had a child in early 2016; she was rx'ed synthroid in late 2016, due to the spontaneous evolution of hypothyroidism).  She is not at risk for pregnancy now.  pt states she feels well in general, except for fatigue.   Past Medical History:  Diagnosis Date  . Anxiety   . Asthma    activity induced asthma  . Endometriosis   . Gall stones 07/30/2017  . GERD (gastroesophageal reflux disease)   . Gestational diabetes mellitus   . Hypothyroidism   . Ovarian cyst   . Stress incontinence 04/16/2017  . Thyroiditis   . 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 CHOLECYSTECTOMY N/A 11/28/2017   Procedure: LAPAROSCOPIC CHOLECYSTECTOMY WITH INTRAOPERATIVE CHOLANGIOGRAM;  Surgeon: Luretha Murphy, MD;  Location: [facility] ORS;  Service: General;  Laterality: N/A;  . LAPAROSCOPY  2009  . nsvd  [id number]   x 2    Social History   Socioeconomic History  . Marital status: Married    Spouse name: Not on file  . Number of children: Not on file  . Years of education: Not on file  . Highest education level: Not on file  Occupational History  . Not on file  Social Needs  . Financial resource strain: Not on file  . Food insecurity:    Worry: Not on file    Inability: Not on file  . Transportation needs:    Medical: Not on file    Non-medical: Not on file  Tobacco Use  . Smoking status: Never Smoker  . Smokeless tobacco: Never Used  Substance and Sexual Activity  . Alcohol use: No  . Drug use: No  . Sexual activity: Yes    Birth control/protection: Pill  Lifestyle  . Physical activity:    Days per week: Not on file    Minutes per session: Not on file  . Stress: Not on file  Relationships  . Social  connections:    Talks on phone: Not on file    Gets together: Not on file    Attends religious service: Not on file    Active member of club or organization: Not on file    Attends meetings of clubs or organizations: Not on file    Relationship status: Not on file  . Intimate partner violence:    Fear of current or ex partner: Not on file    Emotionally abused: Not on file    Physically abused: Not on file    Forced sexual activity: Not on file  Other Topics Concern  . Not on file  Social History Narrative   Married.   2 children.   Masters in Programme researcher, broadcasting/film/video.   Enjoys reading, photography, crafting.     Current Outpatient Medications on File Prior to Visit  Medication Sig Dispense Refill  . albuterol (PROAIR HFA) 108 (90 Base) MCG/ACT inhaler Inhale 2 puffs into the lungs every 6 (six) hours as needed for wheezing or shortness of breath. 1 Inhaler 2  . levothyroxine (SYNTHROID, LEVOTHROID) 50 MCG tablet TAKE 1 TABLET(50 MCG) BY MOUTH DAILY BEFORE BREAKFAST 30 tablet 3  . naproxen (NAPROSYN) 500 MG tablet Take 1 tablet (500 mg total) by mouth 2 (two) times daily  with a meal. For pain/inflammation. 20 tablet 0  . norethindrone (MICRONOR,CAMILA,ERRIN) 0.35 MG tablet Take 1 tablet (0.35 mg total) by mouth daily. 3 Package 4  . Prenatal Vit-Fe Fumarate-FA (PRENATAL MULTIVITAMIN) TABS tablet Take 1 tablet by mouth daily.     . Vitamin D, Ergocalciferol, (DRISDOL) 50000 units CAPS capsule Take 1 capsule (50,000 Units total) by mouth every 7 (seven) days. 12 capsule 4   No current facility-administered medications on file prior to visit.     No Known Allergies  Family History  Problem Relation Age of Onset  . Diabetes Father   . Asthma Father   . Hypertension Father   . Heart disease Maternal Grandmother   . Colon cancer Maternal Grandmother   . Breast cancer Maternal Grandmother   . Heart disease Paternal Grandfather   . Pancreatic cancer Paternal Grandmother   . Thyroid disease Neg  Hx     BP 100/68 (BP Location: Right Arm, Patient Position: Sitting, Cuff Size: Normal)   Pulse 89   Ht 5\' 5"  (1.651 m)   Wt 171 lb 9.6 oz (77.8 kg)   SpO2 96%   BMI 28.56 kg/m    Review of Systems No weight change    Objective:   Physical Exam VITAL SIGNS:  See vs page GENERAL: no distress NECK: There is no palpable thyroid enlargement.  No thyroid nodule is palpable.  No palpable lymphadenopathy at the anterior neck.   Lab Results  Component Value Date   TSH 1.06 08/27/2018   T4TOTAL 4.0 (L) 02/02/2018      Assessment & Plan:  Hypothyroidism: well-replaced. subacute thyroiditis: we discussed.  Thyroid hormone need will likely increase over time.   Patient Instructions  blood tests are requested for you today.  We'll let you know about the results.   Please come back for a follow-up appointment in 6 months.   We can check the blood tests in between if you want.   In view of your medical condition, you should avoid another pregnancy until we have decided it is safe.

## 2018-08-27 NOTE — Patient Instructions (Signed)
blood tests are requested for you today.  We'll let you know about the results.   Please come back for a follow-up appointment in 6 months.   We can check the blood tests in between if you want.   In view of your medical condition, you should avoid another pregnancy until we have decided it is safe.   

## 2018-09-16 ENCOUNTER — Other Ambulatory Visit: Payer: Self-pay | Admitting: Certified Nurse Midwife

## 2018-09-22 ENCOUNTER — Other Ambulatory Visit: Payer: Self-pay

## 2018-09-22 MED ORDER — NORETHINDRONE 0.35 MG PO TABS: 1.0000 | ORAL_TABLET | Freq: Every day | ORAL | 1 refills | Status: DC

## 2018-09-22 NOTE — Telephone Encounter (Signed)
Refill sent per mychart request. Mychart message sent to pt.

## 2018-11-06 ENCOUNTER — Other Ambulatory Visit: Payer: Self-pay | Admitting: Certified Nurse Midwife

## 2018-11-10 ENCOUNTER — Other Ambulatory Visit: Payer: Self-pay

## 2018-11-10 ENCOUNTER — Other Ambulatory Visit: Payer: Self-pay | Admitting: Endocrinology

## 2018-11-10 MED ORDER — NORETHINDRONE 0.35 MG PO TABS: 1.0000 | ORAL_TABLET | Freq: Every day | ORAL | 1 refills | Status: DC

## 2018-12-13 ENCOUNTER — Encounter: Payer: Self-pay | Admitting: Endocrinology

## 2018-12-14 ENCOUNTER — Other Ambulatory Visit: Payer: Self-pay | Admitting: Endocrinology

## 2018-12-14 DIAGNOSIS — E039 Hypothyroidism, unspecified: Secondary | ICD-10-CM

## 2018-12-29 ENCOUNTER — Other Ambulatory Visit (INDEPENDENT_AMBULATORY_CARE_PROVIDER_SITE_OTHER): Payer: BC Managed Care – PPO

## 2018-12-29 ENCOUNTER — Other Ambulatory Visit: Payer: Self-pay

## 2018-12-29 DIAGNOSIS — E039 Hypothyroidism, unspecified: Secondary | ICD-10-CM | POA: Diagnosis not present

## 2018-12-29 LAB — T4, FREE: Free T4: 0.73 ng/dL (ref 0.60–1.60)

## 2018-12-29 LAB — TSH: TSH: 1.92 u[IU]/mL (ref 0.35–4.50)

## 2018-12-30 ENCOUNTER — Other Ambulatory Visit: Payer: Self-pay | Admitting: Obstetrics and Gynecology

## 2018-12-30 MED ORDER — NORGESTIMATE-ETH ESTRADIOL 0.25-35 MG-MCG PO TABS: 1.0000 | ORAL_TABLET | Freq: Every day | ORAL | 11 refills | Status: DC

## 2019-01-08 ENCOUNTER — Ambulatory Visit: Payer: BC Managed Care – PPO | Admitting: Internal Medicine

## 2019-01-08 ENCOUNTER — Encounter: Payer: Self-pay | Admitting: Internal Medicine

## 2019-01-08 ENCOUNTER — Other Ambulatory Visit: Payer: Self-pay

## 2019-01-08 DIAGNOSIS — M26621 Arthralgia of right temporomandibular joint: Secondary | ICD-10-CM

## 2019-01-08 DIAGNOSIS — M26629 Arthralgia of temporomandibular joint, unspecified side: Secondary | ICD-10-CM | POA: Insufficient documentation

## 2019-01-08 NOTE — Progress Notes (Signed)
Subjective:    Patient ID: Diane [name], female    DOB: [birth year], 33 y.o.   MRN: [id number]  HPI  Here due to right ear pain  Started yesterday morning Mostly when moving her jaw---sharp pain No discharge [place] with [place] this weekend (non chlorinated) No injuries or falls No pain with chewing in the past--but does have a click in that jaw (has reviewed this with her dentist)  Last dentist appt ~ 8 months ago--no apparent problems  Took 400mg  motrin yesterday--it helped  Current Outpatient Medications on File Prior to Visit  Medication Sig Dispense Refill  . albuterol (PROAIR HFA) 108 (90 Base) MCG/ACT inhaler Inhale 2 puffs into the lungs every 6 (six) hours as needed for wheezing or shortness of breath. 1 Inhaler 2  . levothyroxine (SYNTHROID) 50 MCG tablet TAKE 1 TABLET(50 MCG) BY MOUTH DAILY BEFORE BREAKFAST 30 tablet 3  . norgestimate-ethinyl estradiol (ORTHO-CYCLEN) 0.25-35 MG-MCG tablet Take 1 tablet by mouth daily. 1 Package 11  . Prenatal Vit-Fe Fumarate-FA (PRENATAL MULTIVITAMIN) TABS tablet Take 1 tablet by mouth daily.     . Vitamin D, Ergocalciferol, (DRISDOL) 50000 units CAPS capsule Take 1 capsule (50,000 Units total) by mouth every 7 (seven) days. 12 capsule 4   No current facility-administered medications on file prior to visit.     No Known Allergies  Past Medical History:  Diagnosis Date  . Anxiety   . Asthma    activity induced asthma  . Endometriosis   . Gall stones 07/30/2017  . GERD (gastroesophageal reflux disease)   . Gestational diabetes mellitus   . Hypothyroidism   . Ovarian cyst   . Stress incontinence 04/16/2017  . Thyroiditis   . 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 CHOLECYSTECTOMY N/A 11/28/2017   Procedure: LAPAROSCOPIC CHOLECYSTECTOMY WITH INTRAOPERATIVE CHOLANGIOGRAM;  Surgeon: Luretha MurphyMartin, Matthew, MD;  Location: [facility] ORS;  Service: General;  Laterality: N/A;   . LAPAROSCOPY  2009  . nsvd  [id number]   x 2    Family History  Problem Relation Age of Onset  . Diabetes Father   . Asthma Father   . Hypertension Father   . Heart disease Maternal Grandmother   . Colon cancer Maternal Grandmother   . Breast cancer Maternal Grandmother   . Heart disease Paternal Grandfather   . Pancreatic cancer Paternal Grandmother   . Thyroid disease Neg Hx     Social History   Socioeconomic History  . Marital status: Married    Spouse name: Not on file  . Number of children: Not on file  . Years of education: Not on file  . Highest education level: Not on file  Occupational History  . Not on file  Social Needs  . Financial resource strain: Not on file  . Food insecurity    Worry: Not on file    Inability: Not on file  . Transportation needs    Medical: Not on file    Non-medical: Not on file  Tobacco Use  . Smoking status: Never Smoker  . Smokeless tobacco: Never Used  Substance and Sexual Activity  . Alcohol use: No  . Drug use: No  . Sexual activity: Yes    Birth control/protection: Pill  Lifestyle  . Physical activity    Days per week: Not on file    Minutes per session: Not on file  . Stress: Not on file  Relationships  . Social connections  Talks on phone: Not on file    Gets together: Not on file    Attends religious service: Not on file    Active member of club or organization: Not on file    Attends meetings of clubs or organizations: Not on file    Relationship status: Not on file  . Intimate partner violence    Fear of current or ex partner: Not on file    Emotionally abused: Not on file    Physically abused: Not on file    Forced sexual activity: Not on file  Other Topics Concern  . Not on file  Social History Narrative   Married.   2 children.   Masters in Scientist, physiological.   Enjoys reading, photography, crafting.    Review of Systems  No fever No URI symptoms Does grind her teeth at night     Objective:    Physical Exam  HENT:  Teeth and mouth normal No tragal tenderness and canals/TMs normal Clunk/significant popping in right TMJ--but not really tender           Assessment & Plan:

## 2019-01-08 NOTE — Assessment & Plan Note (Signed)
Seems to be pain here and referred into ear Reassured--ear is normal  No dental problems May have had acute injury in sleep (grinding)----recommended mouth guard Try ibuprofen 400mg  tid for a week

## 2019-01-08 NOTE — Patient Instructions (Signed)
Please try ibuprofen 400mg  (2 of the over the counter tabs) three times a day with meals for a week. It is time to try a mouth guard at night

## 2019-02-04 ENCOUNTER — Other Ambulatory Visit: Payer: Self-pay

## 2019-02-04 ENCOUNTER — Ambulatory Visit (INDEPENDENT_AMBULATORY_CARE_PROVIDER_SITE_OTHER): Payer: BC Managed Care – PPO | Admitting: Obstetrics and Gynecology

## 2019-02-04 ENCOUNTER — Encounter: Payer: Self-pay | Admitting: Obstetrics and Gynecology

## 2019-02-04 VITALS — BP 117/78 | HR 75 | Ht 65.0 in | Wt 184.7 lb

## 2019-02-04 DIAGNOSIS — Z01419 Encounter for gynecological examination (general) (routine) without abnormal findings: Secondary | ICD-10-CM

## 2019-02-04 DIAGNOSIS — E039 Hypothyroidism, unspecified: Secondary | ICD-10-CM | POA: Diagnosis not present

## 2019-02-04 NOTE — Patient Instructions (Signed)
 Preventive Care 21-34 Years Old, Female Preventive care refers to visits with your health care provider and lifestyle choices that can promote health and wellness. This includes:  A yearly physical exam. This may also be called an annual well check.  Regular dental visits and eye exams.  Immunizations.  Screening for certain conditions.  Healthy lifestyle choices, such as eating a healthy diet, getting regular exercise, not using drugs or products that contain nicotine and tobacco, and limiting alcohol use. What can I expect for my preventive care visit? Physical exam Your health care provider will check your:  Height and weight. This may be used to calculate body mass index (BMI), which tells if you are at a healthy weight.  Heart rate and blood pressure.  Skin for abnormal spots. Counseling Your health care provider may ask you questions about your:  Alcohol, tobacco, and drug use.  Emotional well-being.  Home and relationship well-being.  Sexual activity.  Eating habits.  Work and work environment.  Method of birth control.  Menstrual cycle.  Pregnancy history. What immunizations do I need?  Influenza (flu) vaccine  This is recommended every year. Tetanus, diphtheria, and pertussis (Tdap) vaccine  You may need a Td booster every 10 years. Varicella (chickenpox) vaccine  You may need this if you have not been vaccinated. Human papillomavirus (HPV) vaccine  If recommended by your health care provider, you may need three doses over 6 months. Measles, mumps, and rubella (MMR) vaccine  You may need at least one dose of MMR. You may also need a second dose. Meningococcal conjugate (MenACWY) vaccine  One dose is recommended if you are age 19-21 years and a first-year college student living in a residence hall, or if you have one of several medical conditions. You may also need additional booster doses. Pneumococcal conjugate (PCV13) vaccine  You may need  this if you have certain conditions and were not previously vaccinated. Pneumococcal polysaccharide (PPSV23) vaccine  You may need one or two doses if you smoke cigarettes or if you have certain conditions. Hepatitis A vaccine  You may need this if you have certain conditions or if you travel or work in places where you may be exposed to hepatitis A. Hepatitis B vaccine  You may need this if you have certain conditions or if you travel or work in places where you may be exposed to hepatitis B. Haemophilus influenzae type b (Hib) vaccine  You may need this if you have certain conditions. You may receive vaccines as individual doses or as more than one vaccine together in one shot (combination vaccines). Talk with your health care provider about the risks and benefits of combination vaccines. What tests do I need?  Blood tests  Lipid and cholesterol levels. These may be checked every 5 years starting at age 20.  Hepatitis C test.  Hepatitis B test. Screening  Diabetes screening. This is done by checking your blood sugar (glucose) after you have not eaten for a while (fasting).  Sexually transmitted disease (STD) testing.  BRCA-related cancer screening. This may be done if you have a family history of breast, ovarian, tubal, or peritoneal cancers.  Pelvic exam and Pap test. This may be done every 3 years starting at age 21. Starting at age 30, this may be done every 5 years if you have a Pap test in combination with an HPV test. Talk with your health care provider about your test results, treatment options, and if necessary, the need for more   tests. Follow these instructions at home: Eating and drinking   Eat a diet that includes fresh fruits and vegetables, whole grains, lean protein, and low-fat dairy.  Take vitamin and mineral supplements as recommended by your health care provider.  Do not drink alcohol if: ? Your health care provider tells you not to drink. ? You are  pregnant, may be pregnant, or are planning to become pregnant.  If you drink alcohol: ? Limit how much you have to 0-1 drink a day. ? Be aware of how much alcohol is in your drink. In the [place] one drink equals one 12 oz bottle of beer (355 mL), one 5 oz glass of wine (148 mL), or one 1 oz glass of hard liquor (44 mL). Lifestyle  Take daily care of your teeth and gums.  Stay active. Exercise for at least 30 minutes on 5 or more days each week.  Do not use any products that contain nicotine or tobacco, such as cigarettes, e-cigarettes, and chewing tobacco. If you need help quitting, ask your health care provider.  If you are sexually active, practice safe sex. Use a condom or other form of birth control (contraception) in order to prevent pregnancy and STIs (sexually transmitted infections). If you plan to become pregnant, see your health care provider for a preconception visit. What's next?  Visit your health care provider once a year for a well check visit.  Ask your health care provider how often you should have your eyes and teeth checked.  Stay up to date on all vaccines. This information is not intended to replace advice given to you by your health care provider. Make sure you discuss any questions you have with your health care provider. Document Released: 08/06/2001 Document Revised: 02/19/2018 Document Reviewed: 02/19/2018 Elsevier Patient Education  2020 [employer].

## 2019-02-04 NOTE — Progress Notes (Signed)
SUBJECTIVE:  33 y.o. caucasian female for annual routine checkup with concerns for thyroid imbalance.   TSH levels WNL per labs drawn last month by endocrinologist. Discussed contacting endocrinologist to follow up with increased hair loss and unanticipated weight gain in the last few months. Labs to be drawn by endocrinologist.   Stay-at-home mom, sexually active with single female partner, no concerns for STIs, using OCP and happy with it. Denies urinary or vaginal symptoms. Pt reports not eating or exercising as well as she could, but chases after two little ones at home keeping her active. Just stopped BF one month ago, denies breast issues.     Current Outpatient Medications  Medication Sig Dispense Refill  . albuterol (PROAIR HFA) 108 (90 Base) MCG/ACT inhaler Inhale 2 puffs into the lungs every 6 (six) hours as needed for wheezing or shortness of breath. 1 Inhaler 2  . levothyroxine (SYNTHROID) 50 MCG tablet TAKE 1 TABLET(50 MCG) BY MOUTH DAILY BEFORE BREAKFAST 30 tablet 3  . norgestimate-ethinyl estradiol (ORTHO-CYCLEN) 0.25-35 MG-MCG tablet Take 1 tablet by mouth daily. 1 Package 11  . Prenatal Vit-Fe Fumarate-FA (PRENATAL MULTIVITAMIN) TABS tablet Take 1 tablet by mouth daily.     . Vitamin D, Ergocalciferol, (DRISDOL) 50000 units CAPS capsule Take 1 capsule (50,000 Units total) by mouth every 7 (seven) days. 12 capsule 4   No current facility-administered medications for this visit.    Allergies: Patient has no known allergies.  Patient's last menstrual period was 02/03/2019.  ROS:  Feeling well. No dyspnea or chest pain on exertion.  No abdominal pain, change in bowel habits, black or bloody stools.  No urinary tract symptoms. GYN ROS: normal menses, no abnormal bleeding, pelvic pain or discharge, no breast pain or new or enlarging lumps on self exam. No neurological complaints.  OBJECTIVE:  The patient appears well, alert, oriented x 3, in no distress. BP 117/78   Pulse 75   Ht  5\' 5"  (1.651 m)   Wt 83.8 kg   LMP 02/03/2019   BMI 30.74 kg/m  ENT normal.  Neck supple. No adenopathy or thyromegaly. PERLA. Lungs are clear, good air entry, no wheezes, rhonchi or rales. S1 and S2 normal, no murmurs, regular rate and rhythm. Abdomen soft without tenderness, guarding, mass or organomegaly. Extremities show no edema, normal peripheral pulses. Neurological is normal, no focal findings.  BREAST EXAM: breasts appear normal, no suspicious masses, no skin or nipple changes or axillary nodes  PELVIC EXAM: normal external genitalia, vulva, vagina, cervix, uterus and adnexa  ASSESSMENT:  well woman Hypothyroid  PLAN:  FU in 1 yr for AE or sooner if needed.  FU with endocrinologist for synthroid adjustment for current s/s  [employer], SNM

## 2019-02-09 ENCOUNTER — Other Ambulatory Visit: Payer: Self-pay | Admitting: Obstetrics and Gynecology

## 2019-02-09 DIAGNOSIS — O24410 Gestational diabetes mellitus in pregnancy, diet controlled: Secondary | ICD-10-CM

## 2019-02-09 DIAGNOSIS — E559 Vitamin D deficiency, unspecified: Secondary | ICD-10-CM

## 2019-03-02 ENCOUNTER — Ambulatory Visit: Payer: BLUE CROSS/BLUE SHIELD | Admitting: Endocrinology

## 2019-03-08 ENCOUNTER — Other Ambulatory Visit: Payer: Self-pay

## 2019-03-10 ENCOUNTER — Ambulatory Visit: Payer: BC Managed Care – PPO | Admitting: Endocrinology

## 2019-03-10 ENCOUNTER — Encounter: Payer: Self-pay | Admitting: Endocrinology

## 2019-03-10 ENCOUNTER — Other Ambulatory Visit: Payer: Self-pay

## 2019-03-10 VITALS — BP 128/82 | HR 86 | Ht 65.0 in | Wt 183.8 lb

## 2019-03-10 DIAGNOSIS — E039 Hypothyroidism, unspecified: Secondary | ICD-10-CM

## 2019-03-10 DIAGNOSIS — L659 Nonscarring hair loss, unspecified: Secondary | ICD-10-CM | POA: Diagnosis not present

## 2019-03-10 DIAGNOSIS — E069 Thyroiditis, unspecified: Secondary | ICD-10-CM

## 2019-03-10 LAB — T4, FREE: Free T4: 1.02 ng/dL (ref 0.60–1.60)

## 2019-03-10 LAB — T3, FREE: T3, Free: 3.4 pg/mL (ref 2.3–4.2)

## 2019-03-10 LAB — TSH: TSH: 0.82 u[IU]/mL (ref 0.35–4.50)

## 2019-03-10 NOTE — Progress Notes (Signed)
Subjective:    Patient ID: Diane [name], female    DOB: Mar 12, 1986, 33 y.o.   MRN: [id number]  HPI Pt returns for f/u of subacute thyroiditis (dx'ed mid-2016, when she presented with hyperthyroidism; she has never had thyroid imaging; she had births in 2016 and 2018; she was rx'ed synthroid in late 2016, due to the spontaneous evolution of hypothyroidism).  She is not at risk for pregnancy now.  She reports slight diffuse hair loss from the head, and assoc fatigue.   Past Medical History:  Diagnosis Date  . Anxiety   . Asthma    activity induced asthma  . Endometriosis   . Gall stones 07/30/2017  . GERD (gastroesophageal reflux disease)   . Gestational diabetes mellitus   . Hypothyroidism   . Ovarian cyst   . Stress incontinence 04/16/2017  . Thyroiditis   . 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 CHOLECYSTECTOMY N/A 11/28/2017   Procedure: LAPAROSCOPIC CHOLECYSTECTOMY WITH INTRAOPERATIVE CHOLANGIOGRAM;  Surgeon: Luretha MurphyMartin, Matthew, MD;  Location: [facility] ORS;  Service: General;  Laterality: N/A;  . LAPAROSCOPY  2009  . nsvd  [id number]   x 2    Social History   Socioeconomic History  . Marital status: Married    Spouse name: Not on file  . Number of children: Not on file  . Years of education: Not on file  . Highest education level: Not on file  Occupational History  . Not on file  Social Needs  . Financial resource strain: Not on file  . Food insecurity    Worry: Not on file    Inability: Not on file  . Transportation needs    Medical: Not on file    Non-medical: Not on file  Tobacco Use  . Smoking status: Never Smoker  . Smokeless tobacco: Never Used  Substance and Sexual Activity  . Alcohol use: No  . Drug use: No  . Sexual activity: Yes    Birth control/protection: Pill  Lifestyle  . Physical activity    Days per week: Not on file    Minutes per session: Not on file  . Stress: Not on file  Relationships   . Social Musicianconnections    Talks on phone: Not on file    Gets together: Not on file    Attends religious service: Not on file    Active member of club or organization: Not on file    Attends meetings of clubs or organizations: Not on file    Relationship status: Not on file  . Intimate partner violence    Fear of current or ex partner: Not on file    Emotionally abused: Not on file    Physically abused: Not on file    Forced sexual activity: Not on file  Other Topics Concern  . Not on file  Social History Narrative   Married.   2 children.   Masters in Programme researcher, broadcasting/film/videoducation.   Enjoys reading, photography, crafting.     Current Outpatient Medications on File Prior to Visit  Medication Sig Dispense Refill  . albuterol (PROAIR HFA) 108 (90 Base) MCG/ACT inhaler Inhale 2 puffs into the lungs every 6 (six) hours as needed for wheezing or shortness of breath. 1 Inhaler 2  . levothyroxine (SYNTHROID) 50 MCG tablet TAKE 1 TABLET(50 MCG) BY MOUTH DAILY BEFORE BREAKFAST 30 tablet 3  . norgestimate-ethinyl estradiol (ORTHO-CYCLEN) 0.25-35 MG-MCG tablet Take 1 tablet by mouth daily. 1 Package 11  .  Prenatal Vit-Fe Fumarate-FA (PRENATAL MULTIVITAMIN) TABS tablet Take 1 tablet by mouth daily.     . Vitamin D, Ergocalciferol, (DRISDOL) 50000 units CAPS capsule Take 1 capsule (50,000 Units total) by mouth every 7 (seven) days. 12 capsule 4   No current facility-administered medications on file prior to visit.     No Known Allergies  Family History  Problem Relation Age of Onset  . Diabetes Father   . Asthma Father   . Hypertension Father   . Heart disease Maternal Grandmother   . Colon cancer Maternal Grandmother   . Breast cancer Maternal Grandmother   . Heart disease Paternal Grandfather   . Pancreatic cancer Paternal Grandmother   . Other Brother   . Thyroid disease Neg Hx     BP 128/82 (BP Location: Left Arm, Patient Position: Sitting, Cuff Size: Normal)   Pulse 86   Ht 5\' 5"  (1.651 m)   Wt  183 lb 12.8 oz (83.4 kg)   SpO2 98%   BMI 30.59 kg/m   Review of Systems She also has weight gain and acne.  Denies terminal hair on the face.      Objective:   Physical Exam VITAL SIGNS:  See vs page GENERAL: no distress.  NECK: There is no palpable thyroid enlargement.  No thyroid nodule is palpable.  No palpable lymphadenopathy at the anterior neck.     Lab Results  Component Value Date   TSH 0.82 03/10/2019   T4TOTAL 4.0 (L) 02/02/2018      Assessment & Plan:  Hypothyroidism: well-replaced Thyroiditis: non-palpable Hair loss, worse.  I told pt this could be autoimmune.    Patient Instructions  blood tests are requested for you today.  We'll let you know about the results.   Please come back for a follow-up appointment in 6 months.   We can check the blood tests in between if you want.   In view of your medical condition, you should avoid another pregnancy until we have decided it is safe.

## 2019-03-10 NOTE — Patient Instructions (Signed)
blood tests are requested for you today.  We'll let you know about the results.   Please come back for a follow-up appointment in 6 months.   We can check the blood tests in between if you want.   In view of your medical condition, you should avoid another pregnancy until we have decided it is safe.

## 2019-03-11 LAB — THYROID PEROXIDASE ANTIBODY: Thyroperoxidase Ab SerPl-aCnc: 1 IU/mL (ref <9)

## 2019-03-30 ENCOUNTER — Other Ambulatory Visit: Payer: Self-pay | Admitting: *Deleted

## 2019-03-30 ENCOUNTER — Other Ambulatory Visit: Payer: Self-pay | Admitting: Endocrinology

## 2019-03-30 ENCOUNTER — Other Ambulatory Visit: Payer: Self-pay

## 2019-03-30 ENCOUNTER — Other Ambulatory Visit: Payer: BC Managed Care – PPO

## 2019-03-30 DIAGNOSIS — O24410 Gestational diabetes mellitus in pregnancy, diet controlled: Secondary | ICD-10-CM

## 2019-03-31 LAB — COMPREHENSIVE METABOLIC PANEL
ALT: 7 IU/L (ref 0–32)
AST: 10 IU/L (ref 0–40)
Albumin/Globulin Ratio: 1.6 (ref 1.2–2.2)
Albumin: 4.1 g/dL (ref 3.8–4.8)
Alkaline Phosphatase: 59 IU/L (ref 39–117)
BUN/Creatinine Ratio: 14 (ref 9–23)
BUN: 10 mg/dL (ref 6–20)
Bilirubin Total: <0.2 mg/dL (ref 0.0–1.2)
CO2: 25 mmol/L (ref 20–29)
Calcium: 8.7 mg/dL (ref 8.7–10.2)
Chloride: 103 mmol/L (ref 96–106)
Creatinine, Ser: 0.72 mg/dL (ref 0.57–1.00)
GFR calc Af Amer: 127 mL/min/{1.73_m2} (ref >59)
GFR calc non Af Amer: 110 mL/min/{1.73_m2} (ref >59)
Globulin, Total: 2.6 g/dL (ref 1.5–4.5)
Glucose: 173 mg/dL — ABNORMAL HIGH (ref 65–99)
Potassium: 4 mmol/L (ref 3.5–5.2)
Sodium: 140 mmol/L (ref 134–144)
Total Protein: 6.7 g/dL (ref 6.0–8.5)

## 2019-03-31 LAB — GLUCOSE, 1 HOUR GESTATIONAL: Gestational Diabetes Screen: 170 mg/dL — ABNORMAL HIGH (ref 65–139)

## 2019-03-31 LAB — HEMOGLOBIN A1C
Est. average glucose Bld gHb Est-mCnc: 114 mg/dL
Hgb A1c MFr Bld: 5.6 % (ref 4.8–5.6)

## 2019-04-02 NOTE — Progress Notes (Signed)
Done

## 2019-08-05 ENCOUNTER — Encounter: Payer: Self-pay | Admitting: Endocrinology

## 2019-08-06 ENCOUNTER — Other Ambulatory Visit: Payer: Self-pay

## 2019-08-06 DIAGNOSIS — E039 Hypothyroidism, unspecified: Secondary | ICD-10-CM

## 2019-08-06 MED ORDER — LEVOTHYROXINE SODIUM 50 MCG PO TABS: 50.0000 ug | ORAL_TABLET | Freq: Every day | ORAL | 3 refills | Status: DC

## 2019-08-22 IMAGING — US US ABDOMEN LIMITED
1 series · 14 of 25 positions shown · non-contrast
Comparison: None.

CLINICAL DATA: Epigastric abdominal pain.

EXAM:
ULTRASOUND ABDOMEN LIMITED RIGHT UPPER QUADRANT

[Series 1: us abdomen limited · 0.26mm/px · 14 of 74 slices shown]
[im 1/74]
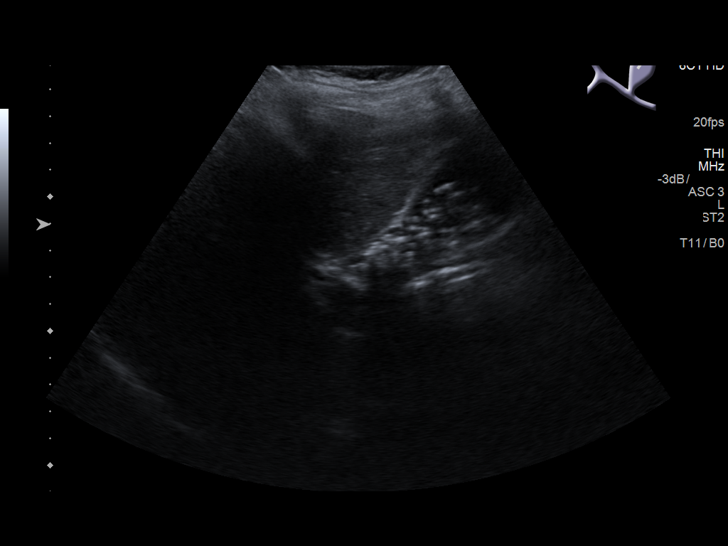
[im 7/74]
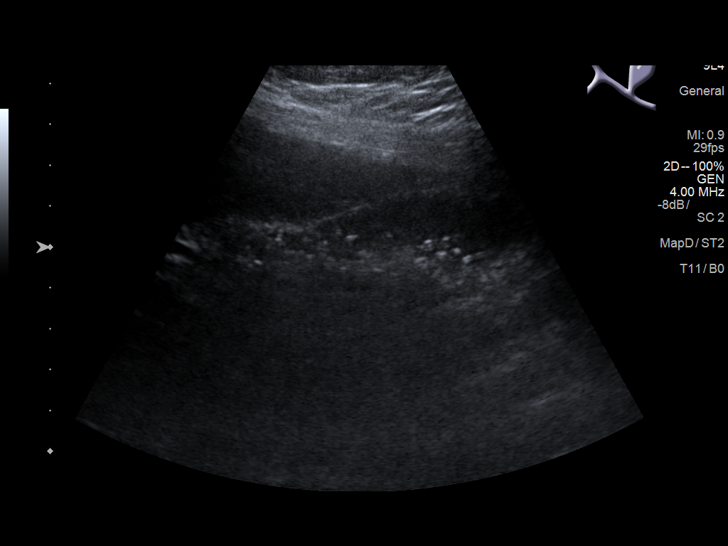
[im 13/74]
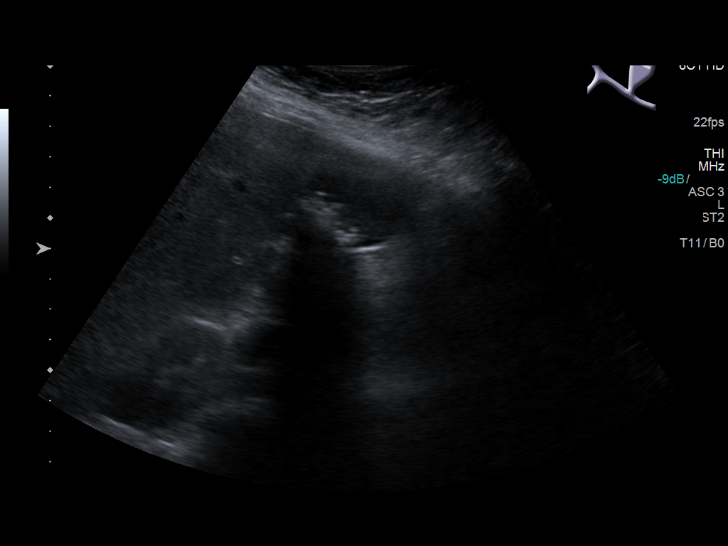
[im 19/74]
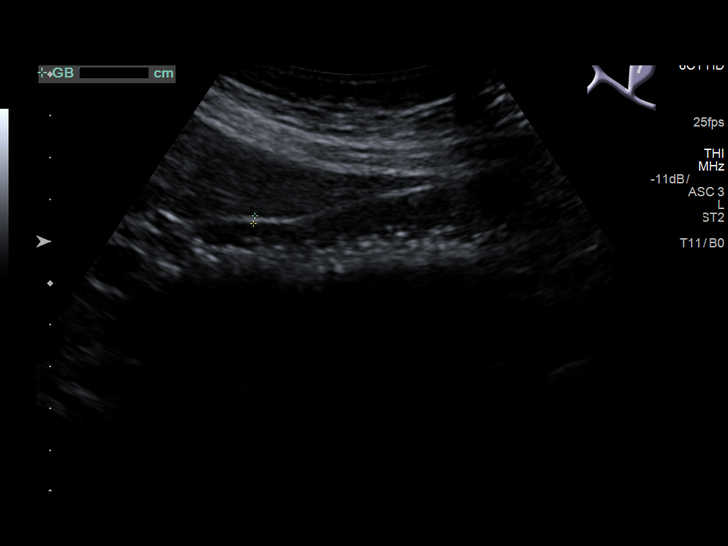
[im 25/74]
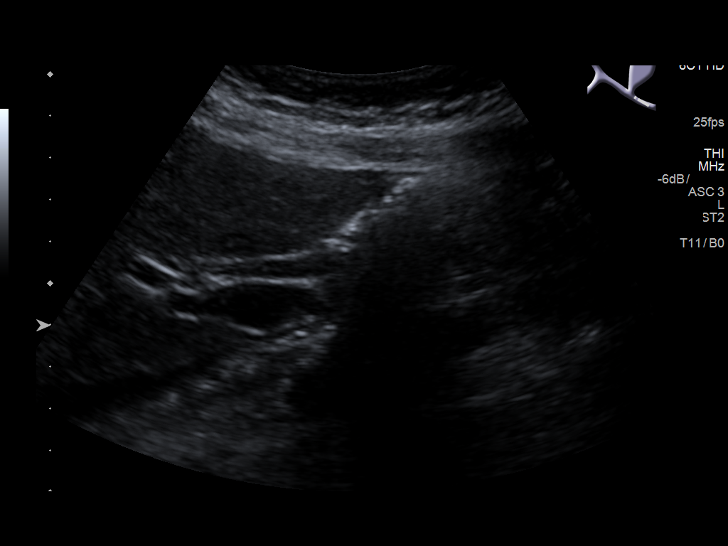
[im 28/74]
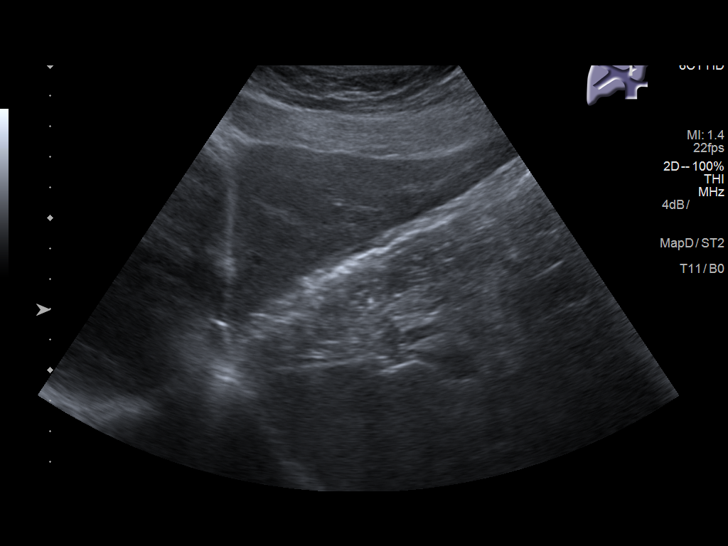
[im 34/74]
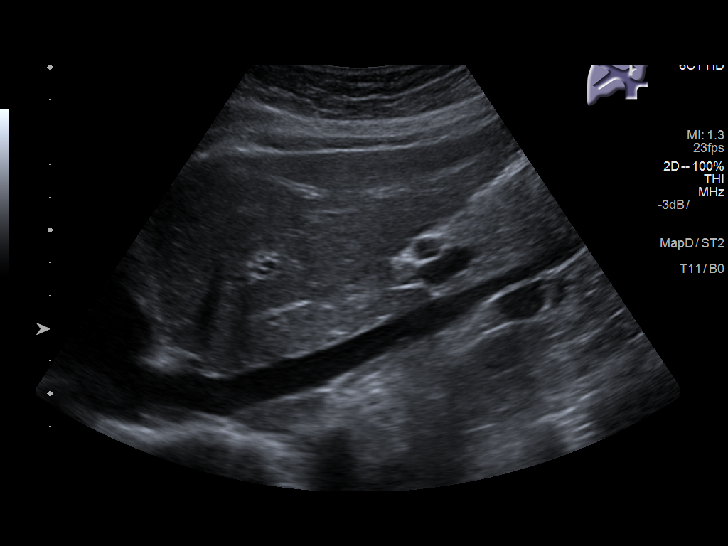
[im 40/74]
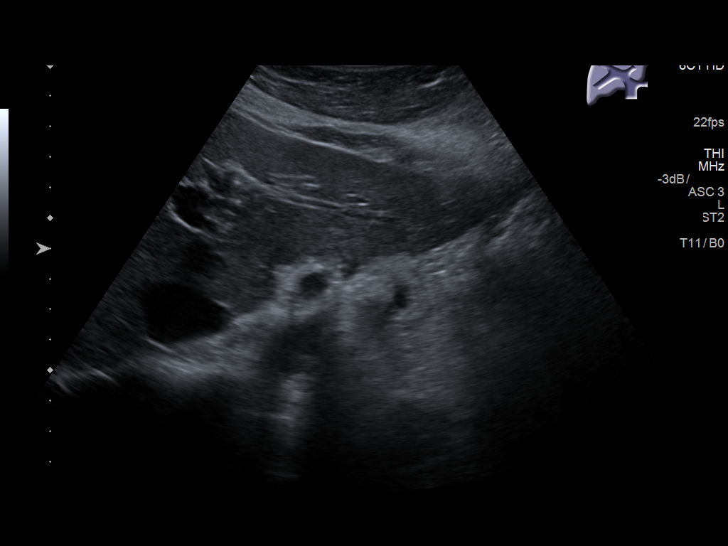
[im 46/74]
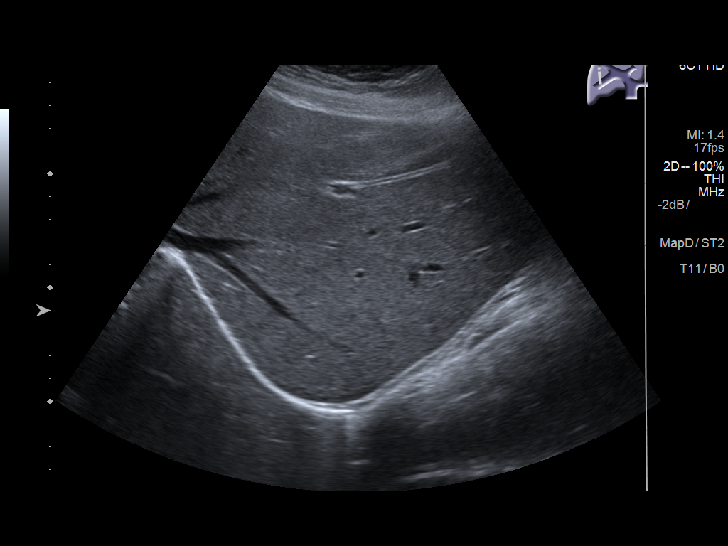
[im 49/74]
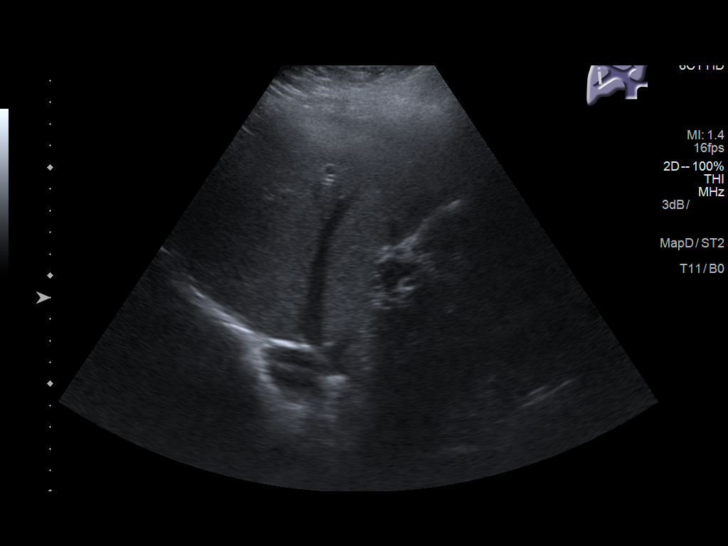
[im 55/74]
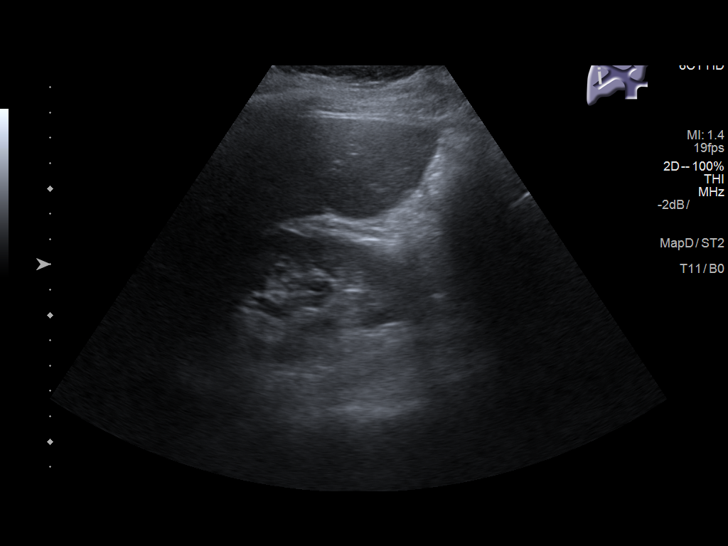
[im 61/74]
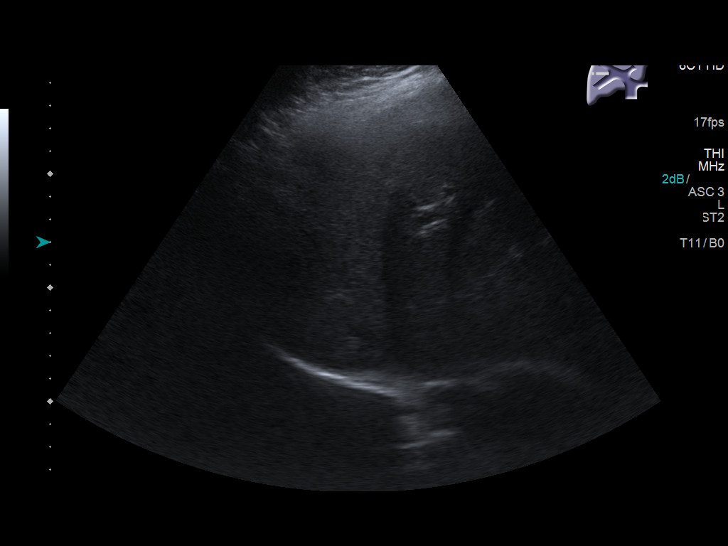
[im 67/74]
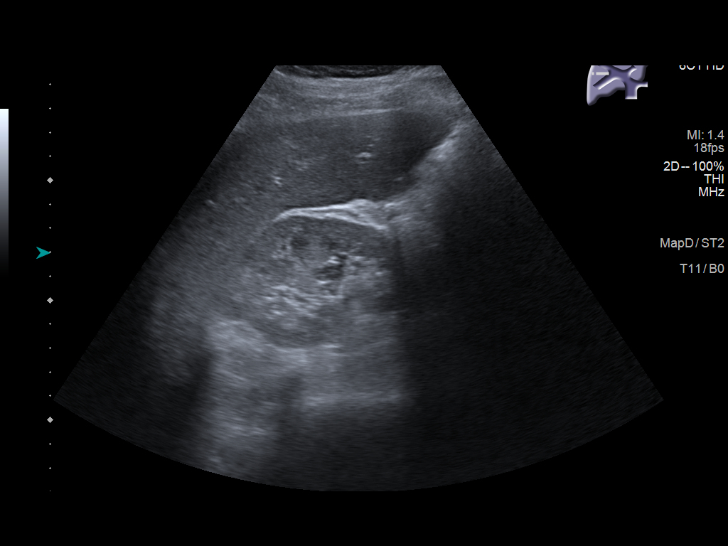
[im 74/74]
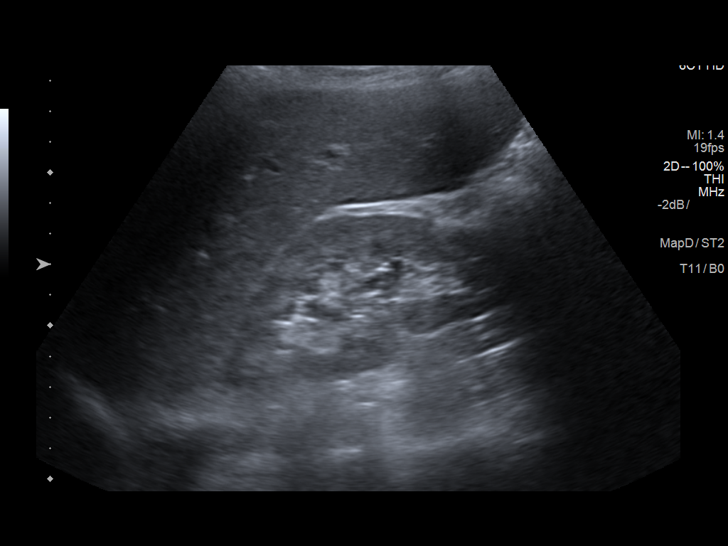

[14 of 25 positions shown; findings below may reference images not displayed]

FINDINGS: Gallbladder:

Cholelithiasis is noted without gallbladder wall thickening or
pericholecystic fluid. No sonographic Murphy's sign is noted.
Largest calculus measures approximately 5 mm.

Common bile duct:

Diameter: 3 mm which is within normal limits.

Liver:

No focal lesion identified. Within normal limits in parenchymal
echogenicity. Portal vein is patent on color Doppler imaging with
normal direction of blood flow towards the liver.
IMPRESSION: Cholelithiasis is noted without evidence of inflammation. No other
abnormality seen in the right upper quadrant of the abdomen.

## 2019-09-06 ENCOUNTER — Ambulatory Visit: Payer: BC Managed Care – PPO | Admitting: Endocrinology

## 2019-09-06 ENCOUNTER — Encounter: Payer: Self-pay | Admitting: Endocrinology

## 2019-09-06 ENCOUNTER — Other Ambulatory Visit: Payer: Self-pay

## 2019-09-06 VITALS — BP 120/70 | HR 70 | Ht 65.0 in | Wt 197.8 lb

## 2019-09-06 DIAGNOSIS — E039 Hypothyroidism, unspecified: Secondary | ICD-10-CM | POA: Diagnosis not present

## 2019-09-06 LAB — T4, FREE: Free T4: 0.8 ng/dL (ref 0.60–1.60)

## 2019-09-06 LAB — TSH: TSH: 1.81 u[IU]/mL (ref 0.35–4.50)

## 2019-09-06 MED ORDER — LEVOTHYROXINE SODIUM 50 MCG PO TABS: 50.0000 ug | ORAL_TABLET | Freq: Every day | ORAL | 3 refills | Status: DC

## 2019-09-06 NOTE — Patient Instructions (Signed)
Blood tests are requested for you today.  We'll let you know about the results.  In view of your medical condition, you should avoid pregnancy until we have decided it is safe.  Please come back for a follow-up appointment in 6 months.

## 2019-09-06 NOTE — Progress Notes (Signed)
Subjective:    Patient ID: Diane [name], female    DOB: [birth year], 34 y.o.   MRN: [id number]  HPI Pt returns for f/u of subacute thyroiditis (dx'ed mid-2016, when she presented with hyperthyroidism; she has never had thyroid imaging; she had births in 2016 and 2018; she was rx'ed synthroid in late 2016, due to the spontaneous evolution of hypothyroidism).  She is not at risk for pregnancy now.  She reports ongoing diffuse hair loss from the head.   Past Medical History:  Diagnosis Date  . Anxiety   . Asthma    activity induced asthma  . Endometriosis   . Gall stones 07/30/2017  . GERD (gastroesophageal reflux disease)   . Gestational diabetes mellitus   . Hypothyroidism   . Ovarian cyst   . Stress incontinence 04/16/2017  . Thyroiditis   . 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 CHOLECYSTECTOMY N/A 11/28/2017   Procedure: LAPAROSCOPIC CHOLECYSTECTOMY WITH INTRAOPERATIVE CHOLANGIOGRAM;  Surgeon: Luretha Murphy, MD;  Location: [facility] ORS;  Service: General;  Laterality: N/A;  . LAPAROSCOPY  2009  . nsvd  [phone]   x 2    Social History   Socioeconomic History  . Marital status: Married    Spouse name: Not on file  . Number of children: Not on file  . Years of education: Not on file  . Highest education level: Not on file  Occupational History  . Not on file  Tobacco Use  . Smoking status: Never Smoker  . Smokeless tobacco: Never Used  Substance and Sexual Activity  . Alcohol use: No  . Drug use: No  . Sexual activity: Yes    Birth control/protection: Pill  Other Topics Concern  . Not on file  Social History Narrative   Married.   2 children.   Masters in Programme researcher, broadcasting/film/video.   Enjoys reading, photography, crafting.    Social Determinants of Health   Financial Resource Strain:   . Difficulty of Paying Living Expenses:   Food Insecurity:   . Worried About Programme researcher, broadcasting/film/video in the Last Year:   . Barista  in the Last Year:   Transportation Needs:   . Freight forwarder (Medical):   Marland Kitchen Lack of Transportation (Non-Medical):   Physical Activity:   . Days of Exercise per Week:   . Minutes of Exercise per Session:   Stress:   . Feeling of Stress :   Social Connections:   . Frequency of Communication with Friends and Family:   . Frequency of Social Gatherings with Friends and Family:   . Attends Religious Services:   . Active Member of Clubs or Organizations:   . Attends Banker Meetings:   Marland Kitchen Marital Status:   Intimate Partner Violence:   . Fear of Current or Ex-Partner:   . Emotionally Abused:   Marland Kitchen Physically Abused:   . Sexually Abused:     Current Outpatient Medications on File Prior to Visit  Medication Sig Dispense Refill  . albuterol (PROAIR HFA) 108 (90 Base) MCG/ACT inhaler Inhale 2 puffs into the lungs every 6 (six) hours as needed for wheezing or shortness of breath. 1 Inhaler 2  . norgestimate-ethinyl estradiol (ORTHO-CYCLEN) 0.25-35 MG-MCG tablet Take 1 tablet by mouth daily. 1 Package 11  . Prenatal Vit-Fe Fumarate-FA (PRENATAL MULTIVITAMIN) TABS tablet Take 1 tablet by mouth daily.      No current facility-administered medications on file prior to visit.  No Known Allergies  Family History  Problem Relation Age of Onset  . Diabetes Father   . Asthma Father   . Hypertension Father   . Heart disease Maternal Grandmother   . Colon cancer Maternal Grandmother   . Breast cancer Maternal Grandmother   . Heart disease Paternal Grandfather   . Pancreatic cancer Paternal Grandmother   . Other Brother   . Thyroid disease Neg Hx     BP 120/70   Pulse 70   Ht 5\' 5"  (1.651 m)   Wt 197 lb 12.8 oz (89.7 kg)   SpO2 98%   BMI 32.92 kg/m    Review of Systems She has gained a few lbs.      Objective:   Physical Exam VITAL SIGNS:  See vs page GENERAL: no distress NECK: There is no palpable thyroid enlargement.  No thyroid nodule is palpable.  No  palpable lymphadenopathy at the anterior neck.   Lab Results  Component Value Date   TSH 1.81 09/06/2019   T4TOTAL 4.0 (L) 02/02/2018       Assessment & Plan:  Hypothyroidism: well-replaced. Hair loss: I told pt this is not directly thyroid-related, but could have a common autoimmune etiology with thryroiditis  Patient Instructions  Blood tests are requested for you today.  We'll let you know about the results.  In view of your medical condition, you should avoid pregnancy until we have decided it is safe.  Please come back for a follow-up appointment in 6 months.

## 2019-09-29 ENCOUNTER — Telehealth: Payer: Self-pay | Admitting: Certified Nurse Midwife

## 2019-09-29 NOTE — Telephone Encounter (Signed)
Called patient to inform her that I spoke with provider about what we had discussed earlier this morning. Patient can have her labs done at her physical that is scheduled for in August.  LVM for her to return my phone call.

## 2019-09-29 NOTE — Telephone Encounter (Signed)
Patient called in requesting to cancel her appointment for her glucose test that was scheduled for 4/9. Patient requested she get that done at her annual appointment in August. Informed patient I would ask the provider if we could do that for her and that I would be in touch with her to let her know whether or not we could do that.

## 2019-09-29 NOTE — Telephone Encounter (Signed)
Yes, that is fine. Thanks, JML

## 2019-10-01 ENCOUNTER — Other Ambulatory Visit: Payer: BC Managed Care – PPO

## 2019-10-04 ENCOUNTER — Ambulatory Visit: Payer: BC Managed Care – PPO

## 2019-12-08 ENCOUNTER — Other Ambulatory Visit: Payer: Self-pay

## 2019-12-08 MED ORDER — NORGESTIMATE-ETH ESTRADIOL 0.25-35 MG-MCG PO TABS: 1.0000 | ORAL_TABLET | Freq: Every day | ORAL | 1 refills | Status: DC

## 2020-01-07 ENCOUNTER — Other Ambulatory Visit: Payer: Self-pay

## 2020-01-07 ENCOUNTER — Ambulatory Visit (INDEPENDENT_AMBULATORY_CARE_PROVIDER_SITE_OTHER): Payer: BC Managed Care – PPO | Admitting: Primary Care

## 2020-01-07 ENCOUNTER — Encounter: Payer: Self-pay | Admitting: Primary Care

## 2020-01-07 DIAGNOSIS — M62838 Other muscle spasm: Secondary | ICD-10-CM | POA: Diagnosis not present

## 2020-01-07 DIAGNOSIS — K130 Diseases of lips: Secondary | ICD-10-CM | POA: Insufficient documentation

## 2020-01-07 NOTE — Progress Notes (Signed)
Subjective:    Patient ID: Diane [name], female    DOB: [birth year], 34 y.o.   MRN: [id number]  HPI  This visit occurred during the SARS-CoV-2 public health emergency.  Safety protocols were in place, including screening questions prior to the visit, additional usage of staff PPE, and extensive cleaning of exam room while observing appropriate contact time as indicated for disinfecting solutions.   Diane [name] is a 34 year old female with a history of hypothyroidism, asthma, TMJ, stress incontinence who presents today with a chief complaint of abdominal pain and inner lip mass.  The lip mass is located to the left lower inner lip which she first noticed about one month ago. She admits to a bad habit of biting her lower inner lip at times. She denies increased growth but will notice increased inflammation at times.   Her abdominal pain is located to the left upper abdomen just underneath the rib. She will notice a sensation of something "folding under the rib" which occurs with certain movement. The site will "clench and hurt for a minute, then stop". This began several months ago which occurs once every one to two weeks. Overall this isn't bothersome, but she's worried as this is a new symptom.   She denies radiation of pain, nausea, vomiting, diarrhea, pain with eating, esophageal burning/reflux. She also denies prior strenuous activity and cannot think of a provoking event. She endorses drinking plenty of water.   Review of Systems  Constitutional: Negative for fever.  Gastrointestinal: Negative for constipation, diarrhea, nausea and vomiting.       See HPI  Skin:       Mass to inner lip       Past Medical History:  Diagnosis Date  . Anxiety   . Asthma    activity induced asthma  . Endometriosis   . Gall stones 07/30/2017  . GERD (gastroesophageal reflux disease)   . Gestational diabetes mellitus   . Hypothyroidism   . Ovarian cyst   . Stress incontinence 04/16/2017  .  Thyroiditis   . 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 Marital status: Married    Spouse name: Not on file  . Number of children: Not on file  . Years of education: Not on file  . Highest education level: Not on file  Occupational History  . Not on file  Tobacco Use  . Smoking status: Never Smoker  . Smokeless tobacco: Never Used  Vaping Use  . Vaping Use: Never used  Substance and Sexual Activity  . Alcohol use: No  . Drug use: No  . Sexual activity: Yes    Birth control/protection: Pill  Other Topics Concern  . Not on file  Social History Narrative   Married.   2 children.   Masters in Programme researcher, broadcasting/film/video.   Enjoys reading, photography, crafting.    Social Determinants of Health   Financial Resource Strain:   . Difficulty of Paying Living Expenses:   Food Insecurity:   . Worried About Programme researcher, broadcasting/film/video in the Last Year:   . Barista in the Last Year:   Transportation Needs:   . Freight forwarder (Medical):   Marland Kitchen Lack of Transportation (Non-Medical):   Physical Activity:   . Days of Exercise per Week:   . Minutes of Exercise per Session:   Stress:   . Feeling of Stress :   Social Connections:   . Frequency of  Communication with Friends and Family:   . Frequency of Social Gatherings with Friends and Family:   . Attends Religious Services:   . Active Member of Clubs or Organizations:   . Attends Banker Meetings:   Marland Kitchen Marital Status:   Intimate Partner Violence:   . Fear of Current or Ex-Partner:   . Emotionally Abused:   Marland Kitchen Physically Abused:   . Sexually Abused:     Past Surgical History:  Procedure Laterality Date  . CHOLECYSTECTOMY N/A 11/28/2017   Procedure: LAPAROSCOPIC CHOLECYSTECTOMY WITH INTRAOPERATIVE CHOLANGIOGRAM;  Surgeon: Luretha Murphy, MD;  Location: [facility] ORS;  Service: General;  Laterality: N/A;  . LAPAROSCOPY  2009  . nsvd  [phone]   x 2    Family History  Problem Relation Age  of Onset  . Diabetes Father   . Asthma Father   . Hypertension Father   . Heart disease Maternal Grandmother   . Colon cancer Maternal Grandmother   . Breast cancer Maternal Grandmother   . Heart disease Paternal Grandfather   . Pancreatic cancer Paternal Grandmother   . Other Brother   . Thyroid disease Neg Hx     No Known Allergies  Current Outpatient Medications on File Prior to Visit  Medication Sig Dispense Refill  . albuterol (PROAIR HFA) 108 (90 Base) MCG/ACT inhaler Inhale 2 puffs into the lungs every 6 (six) hours as needed for wheezing or shortness of breath. 1 Inhaler 2  . levothyroxine (SYNTHROID) 50 MCG tablet Take 1 tablet (50 mcg total) by mouth daily before breakfast. 90 tablet 3  . norgestimate-ethinyl estradiol (ORTHO-CYCLEN) 0.25-35 MG-MCG tablet Take 1 tablet by mouth daily. 28 tablet 1  . Prenatal Vit-Fe Fumarate-FA (PRENATAL MULTIVITAMIN) TABS tablet Take 1 tablet by mouth daily.      No current facility-administered medications on file prior to visit.    BP 122/82   Pulse 80   Temp (!) 95.6 F (35.3 C) (Temporal)   Ht 5\' 5"  (1.651 m)   Wt 200 lb (90.7 kg)   LMP 01/03/2020   SpO2 98%   BMI 33.28 kg/m    Objective:   Physical Exam HENT:     Mouth/Throat:     Mouth: Mucous membranes are moist. Oral lesions present.     Comments: 0.25 cm, raised, rounded mucocele to left lower inner lip. Cardiovascular:     Rate and Rhythm: Normal rate and regular rhythm.  Pulmonary:     Effort: Pulmonary effort is normal.     Breath sounds: Normal breath sounds.  Abdominal:     General: Abdomen is flat. Bowel sounds are normal.     Palpations: There is no mass.     Tenderness: There is no abdominal tenderness.     Hernia: No hernia is present.       Comments: Unable to provoke symptoms  Musculoskeletal:     Cervical back: Neck supple.  Skin:    General: Skin is warm and dry.            Assessment & Plan:

## 2020-01-07 NOTE — Assessment & Plan Note (Signed)
Symptoms to left upper abdomen seem more MSK rather than GI. She has no other associated symptoms of GI cause, and also based off of HPI seems to represent spasms.   Exam today completely benign.  I offered an ultrasound for which she kindly declines. She will monitor symptoms and update if there is increased occurrence of symptoms or other changes.

## 2020-01-07 NOTE — Assessment & Plan Note (Signed)
Obvious on exam today, reassurance provided. Discussed to work on avoiding biting lip, be careful when eating. She will update if the site grows, no need for surgical intervention at this time.

## 2020-01-07 NOTE — Patient Instructions (Signed)
Avoid biting your lip, and be careful when eating.  Please update me if you notice the muscle spasms more frequently, and/or if you notice pain with eating/nausea/constipation.   It was a pleasure to see you today!   Mucocele of the Mouth A mucocele is a growth or bump (cyst) that is filled with mucus. A mucocele can form on many parts of the mouth, including the gums, the tongue, and the inside of the cheeks. A common spot is inside the lower lip. Mucoceles are not dangerous, and they are usually not painful. A mucocele can be uncomfortable if it is very large or if it is located under the tongue. Small mucoceles often clear up on their own. Treatment may not be needed. Mucoceles that are large or that keep coming back might need to be removed. What are the causes? Mucoceles form when the salivary ducts in the mouth are damaged and leak saliva. These ducts carry saliva from the salivary glands to the surface of the mouth. A mucocele can develop because of:  An injury to your mouth.  Sucking or biting on your lips or tongue.  A blocked salivary duct. This is sometimes caused by swelling.  Piercing of the tongue or lip for jewelry. In some cases, the cause may not be known. What are the signs or symptoms? Symptoms of this condition include a smooth, painless bumpinside your mouth. The bump may:  Show up all of a sudden.  Have thin walls and a bluish color.  Change in size. Most are smaller than  inch (1.3 cm). Mucoceles under the tongue are called ranulas. These may be bigger and may push your tongue up and back. In some cases, this can make it hard to talk, swallow, or breathe. How is this diagnosed? This condition is usually diagnosed with a physical exam. Your health care provider will often be able to tell if you have a mucocele by looking at it and feeling it. You may also have tests, such as:  An ultrasound to check for problems with your salivary gland.  An X-ray to see if  stones are blocking the exit of saliva. How is this treated? Treatment may depend on the size of the mucocele:  For a small mucocele, treatment is usually not needed. It will drain on its own and go away.  For larger mucoceles and ranulas, surgery may be needed. This may be done if the mucocele does not go away or if it keeps coming back. The entire mucocele may be taken out. In some cases, the salivary gland may also be removed. Follow these instructions at home:  Take over-the-counter and prescription medicines only as told by your health care provider.  Do not try to drain a mucocele on your own. Do not poke a hole in it.  Do not use any products that contain nicotine or tobacco, such as cigarettes and e-cigarettes. If you need help quitting, ask your health care provider.  Do not suck or bite on your lips or tongue.  If your mucocele was removed, avoid hard, sharp, or spicy foods and foods that have high acidity while your mouth is healing.  Keep all follow-up visits as told by your health care provider. This is important. Contact a health care provider if:  You have a lump or cyst inside your mouth that does not go away.  You have a fever. Get help right away if:  You have a lump or cyst in your mouth that: ?  Becomes painful. ? Gets large very fast.  You have a lump or cyst in your mouth that makes it hard to: ? Swallow. ? Talk. ? Breathe. Summary  A mucocele is a growth or bump (cyst) that is filled with mucus. A mucocele can form on many parts of the mouth.  Mucoceles are usually not painful. A mucocele can be uncomfortable if it is very large or if it is located under the tongue.  For a small mucocele, treatment is usually not needed. It will drain on its own and go away.  Larger mucoceles may need to be surgically removed.  Do not try to drain a mucocele on your own. Do not poke a hole in it. This information is not intended to replace advice given to you by  your health care provider. Make sure you discuss any questions you have with your health care provider. Document Revised: 10/22/2017 Document Reviewed: 10/22/2017 Elsevier Patient Education  2020 [employer].

## 2020-01-29 ENCOUNTER — Other Ambulatory Visit: Payer: Self-pay | Admitting: Certified Nurse Midwife

## 2020-01-31 ENCOUNTER — Other Ambulatory Visit: Payer: Self-pay

## 2020-01-31 MED ORDER — NORGESTIMATE-ETH ESTRADIOL 0.25-35 MG-MCG PO TABS: 1.0000 | ORAL_TABLET | Freq: Every day | ORAL | 1 refills | Status: DC

## 2020-01-31 MED ORDER — NORGESTIMATE-ETH ESTRADIOL 0.25-35 MG-MCG PO TABS: 1.0000 | ORAL_TABLET | Freq: Every day | ORAL | 2 refills | Status: DC

## 2020-02-10 ENCOUNTER — Encounter: Payer: BC Managed Care – PPO | Admitting: Certified Nurse Midwife

## 2020-03-08 ENCOUNTER — Other Ambulatory Visit: Payer: Self-pay

## 2020-03-08 ENCOUNTER — Encounter: Payer: Self-pay | Admitting: Endocrinology

## 2020-03-08 ENCOUNTER — Ambulatory Visit: Payer: BC Managed Care – PPO | Admitting: Endocrinology

## 2020-03-08 VITALS — BP 122/84 | HR 79 | Ht 65.0 in | Wt 197.0 lb

## 2020-03-08 DIAGNOSIS — E039 Hypothyroidism, unspecified: Secondary | ICD-10-CM | POA: Diagnosis not present

## 2020-03-08 LAB — TSH: TSH: 0.55 u[IU]/mL (ref 0.35–4.50)

## 2020-03-08 LAB — T4, FREE: Free T4: 0.88 ng/dL (ref 0.60–1.60)

## 2020-03-08 NOTE — Progress Notes (Signed)
Subjective:    Patient ID: Diane [name], female    DOB: 21-May-1986, 34 y.o.   MRN: [id number]  HPI Pt returns for f/u of subacute thyroiditis (dx'ed mid-2016, when she presented with hyperthyroidism; she has never had thyroid imaging; she had births in 2016 and 2018; she was rx'ed synthroid in late 2016, due to the spontaneous evolution of hypothyroidism).  She is not at risk for pregnancy now.  Main symptoms are hair loss and fatigue. Past Medical History:  Diagnosis Date  . Anxiety   . Asthma    activity induced asthma  . Endometriosis   . Gall stones 07/30/2017  . GERD (gastroesophageal reflux disease)   . Gestational diabetes mellitus   . Hypothyroidism   . Ovarian cyst   . Stress incontinence 04/16/2017  . Thyroiditis   . 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 CHOLECYSTECTOMY N/A 11/28/2017   Procedure: LAPAROSCOPIC CHOLECYSTECTOMY WITH INTRAOPERATIVE CHOLANGIOGRAM;  Surgeon: Luretha Murphy, MD;  Location: [facility] ORS;  Service: General;  Laterality: N/A;  . LAPAROSCOPY  2009  . nsvd  [phone]   x 2    Social History   Socioeconomic History  . Marital status: Married    Spouse name: Not on file  . Number of children: Not on file  . Years of education: Not on file  . Highest education level: Not on file  Occupational History  . Not on file  Tobacco Use  . Smoking status: Never Smoker  . Smokeless tobacco: Never Used  Vaping Use  . Vaping Use: Never used  Substance and Sexual Activity  . Alcohol use: No  . Drug use: No  . Sexual activity: Yes    Birth control/protection: Pill  Other Topics Concern  . Not on file  Social History Narrative   Married.   2 children.   Masters in Programme researcher, broadcasting/film/video.   Enjoys reading, photography, crafting.    Social Determinants of Health   Financial Resource Strain:   . Difficulty of Paying Living Expenses: Not on file  Food Insecurity:   . Worried About Programme researcher, broadcasting/film/video in  the Last Year: Not on file  . Ran Out of Food in the Last Year: Not on file  Transportation Needs:   . Lack of Transportation (Medical): Not on file  . Lack of Transportation (Non-Medical): Not on file  Physical Activity:   . Days of Exercise per Week: Not on file  . Minutes of Exercise per Session: Not on file  Stress:   . Feeling of Stress : Not on file  Social Connections:   . Frequency of Communication with Friends and Family: Not on file  . Frequency of Social Gatherings with Friends and Family: Not on file  . Attends Religious Services: Not on file  . Active Member of Clubs or Organizations: Not on file  . Attends Banker Meetings: Not on file  . Marital Status: Not on file  Intimate Partner Violence:   . Fear of Current or Ex-Partner: Not on file  . Emotionally Abused: Not on file  . Physically Abused: Not on file  . Sexually Abused: Not on file    Current Outpatient Medications on File Prior to Visit  Medication Sig Dispense Refill  . albuterol (PROAIR HFA) 108 (90 Base) MCG/ACT inhaler Inhale 2 puffs into the lungs every 6 (six) hours as needed for wheezing or shortness of breath. 1 Inhaler 2  . levothyroxine (SYNTHROID) 50  MCG tablet Take 1 tablet (50 mcg total) by mouth daily before breakfast. 90 tablet 3  . norgestimate-ethinyl estradiol (ESTARYLLA) 0.25-35 MG-MCG tablet Take 1 tablet by mouth daily. 28 tablet 1  . Prenatal Vit-Fe Fumarate-FA (PRENATAL MULTIVITAMIN) TABS tablet Take 1 tablet by mouth daily.      No current facility-administered medications on file prior to visit.    No Known Allergies  Family History  Problem Relation Age of Onset  . Diabetes Father   . Asthma Father   . Hypertension Father   . Heart disease Maternal Grandmother   . Colon cancer Maternal Grandmother   . Breast cancer Maternal Grandmother   . Heart disease Paternal Grandfather   . Pancreatic cancer Paternal Grandmother   . Other Brother   . Thyroid disease Neg Hx      BP 122/84   Pulse 79   Ht 5\' 5"  (1.651 m)   Wt 197 lb (89.4 kg)   SpO2 97%   BMI 32.78 kg/m    Review of Systems     Objective:   Physical Exam VITAL SIGNS:  See vs page GENERAL: no distress  Lab Results  Component Value Date   TSH 0.55 03/08/2020   T4TOTAL 4.0 (L) 02/02/2018       Assessment & Plan:  Hypothyroidism: well-replaced.  Please continue the same synthroid  Patient Instructions  Blood tests are requested for you today.  We'll let you know about the results.  It is best to never miss the medication.  However, if you do miss it, next best is to double up the next time. Please come back for a follow-up appointment in 6 months.

## 2020-03-08 NOTE — Patient Instructions (Signed)
Blood tests are requested for you today.  We'll let you know about the results.  It is best to never miss the medication.  However, if you do miss it, next best is to double up the next time.   Please come back for a follow-up appointment in 6 months.   

## 2020-03-18 ENCOUNTER — Ambulatory Visit (INDEPENDENT_AMBULATORY_CARE_PROVIDER_SITE_OTHER): Payer: BC Managed Care – PPO

## 2020-03-18 ENCOUNTER — Other Ambulatory Visit: Payer: Self-pay

## 2020-03-18 DIAGNOSIS — Z23 Encounter for immunization: Secondary | ICD-10-CM | POA: Diagnosis not present

## 2020-04-06 ENCOUNTER — Ambulatory Visit (INDEPENDENT_AMBULATORY_CARE_PROVIDER_SITE_OTHER): Payer: BC Managed Care – PPO | Admitting: Certified Nurse Midwife

## 2020-04-06 ENCOUNTER — Encounter: Payer: Self-pay | Admitting: Certified Nurse Midwife

## 2020-04-06 ENCOUNTER — Other Ambulatory Visit: Payer: Self-pay

## 2020-04-06 VITALS — BP 109/72 | HR 76 | Ht 65.0 in | Wt 195.3 lb

## 2020-04-06 DIAGNOSIS — Z8632 Personal history of gestational diabetes: Secondary | ICD-10-CM

## 2020-04-06 DIAGNOSIS — E559 Vitamin D deficiency, unspecified: Secondary | ICD-10-CM

## 2020-04-06 DIAGNOSIS — Z01419 Encounter for gynecological examination (general) (routine) without abnormal findings: Secondary | ICD-10-CM | POA: Diagnosis not present

## 2020-04-06 DIAGNOSIS — R5383 Other fatigue: Secondary | ICD-10-CM | POA: Diagnosis not present

## 2020-04-06 DIAGNOSIS — Z6832 Body mass index (BMI) 32.0-32.9, adult: Secondary | ICD-10-CM

## 2020-04-06 DIAGNOSIS — E039 Hypothyroidism, unspecified: Secondary | ICD-10-CM | POA: Diagnosis not present

## 2020-04-06 DIAGNOSIS — Z3041 Encounter for surveillance of contraceptive pills: Secondary | ICD-10-CM

## 2020-04-06 MED ORDER — NORGESTIMATE-ETH ESTRADIOL 0.25-35 MG-MCG PO TABS: 1.0000 | ORAL_TABLET | Freq: Every day | ORAL | 4 refills | Status: DC

## 2020-04-06 NOTE — Patient Instructions (Addendum)
Ethinyl Estradiol; Norgestimate tablets What is this medicine? ETHINYL ESTRADIOL; NORGESTIMATE (ETH in il es tra DYE ole; nor JES ti mate) is an oral contraceptive. The products combine two types of female hormones, an estrogen and a progestin. They are used to prevent ovulation and pregnancy. Some products are also used to treat acne in females. This medicine may be used for other purposes; ask your health care provider or pharmacist if you have questions. COMMON BRAND NAME(S): Estarylla, Mili, MONO-LINYAH, MonoNessa, Norgestimate/Ethinyl Estradiol, Ortho Tri-Cyclen, Ortho Tri-Cyclen Lo, Ortho-Cyclen, Previfem, Sprintec, Tri-Estarylla, TRI-LINYAH, Tri-Lo-Estarylla, Tri-Lo-Marzia, Tri-Lo-Mili, Tri-Lo-Sprintec, Tri-Mili, Tri-Previfem, Tri-Sprintec, Tri-VyLibra, Trinessa, Trinessa Lo, VyLibra What should I tell my health care provider before I take this medicine? They need to know if you have or ever had any of these conditions:  abnormal vaginal bleeding  blood vessel disease or blood clots  breast, cervical, endometrial, ovarian, liver, or uterine cancer  diabetes  gallbladder disease  heart disease or recent heart attack  high blood pressure  high cholesterol  kidney disease  liver disease  migraine headaches  stroke  systemic lupus erythematosus (SLE)  tobacco smoker  an unusual or allergic reaction to estrogens, progestins, other medicines, foods, dyes, or preservatives  pregnant or trying to get pregnant  breast-feeding How should I use this medicine? Take this medicine by mouth. To reduce nausea, this medicine may be taken with food. Follow the directions on the prescription label. Take this medicine at the same time each day and in the order directed on the package. Do not take your medicine more often than directed. Contact your pediatrician regarding the use of this medicine in children. Special care may be needed. This medicine has been used in female children who  have started having menstrual periods. A patient package insert for the product will be given with each prescription and refill. Read this sheet carefully each time. The sheet may change frequently. Overdosage: If you think you have taken too much of this medicine contact a poison control center or emergency room at once. NOTE: This medicine is only for you. Do not share this medicine with others. What if I miss a dose? If you miss a dose, refer to the patient information sheet you received with your medicine for direction. If you miss more than one pill, this medicine may not be as effective and you may need to use another form of birth control. What may interact with this medicine? Do not take this medicine with the following medication:  dasabuvir; ombitasvir; paritaprevir; ritonavir  ombitasvir; paritaprevir; ritonavir This medicine may also interact with the following medications:  acetaminophen  antibiotics or medicines for infections, especially rifampin, rifabutin, rifapentine, and griseofulvin, and possibly penicillins or tetracyclines  aprepitant  ascorbic acid (vitamin C)  atorvastatin  barbiturate medicines, such as phenobarbital  bosentan  carbamazepine  caffeine  clofibrate  cyclosporine  dantrolene  doxercalciferol  felbamate  grapefruit juice  hydrocortisone  medicines for anxiety or sleeping problems, such as diazepam or temazepam  medicines for diabetes, including pioglitazone  mineral oil  modafinil  mycophenolate  nefazodone  oxcarbazepine  phenytoin  prednisolone  ritonavir or other medicines for HIV infection or AIDS  rosuvastatin  selegiline  soy isoflavones supplements  St. John's wort  tamoxifen or raloxifene  theophylline  thyroid hormones  topiramate  warfarin This list may not describe all possible interactions. Give your health care provider a list of all the medicines, herbs, non-prescription drugs, or  dietary supplements you use. Also tell them if  you smoke, drink alcohol, or use illegal drugs. Some items may interact with your medicine. What should I watch for while using this medicine? Visit your doctor or health care professional for regular checks on your progress. You will need a regular breast and pelvic exam and Pap smear while on this medicine. You should also discuss the need for regular mammograms with your health care professional, and follow his or her guidelines for these tests. This medicine can make your body retain fluid, making your fingers, hands, or ankles swell. Your blood pressure can go up. Contact your doctor or health care professional if you feel you are retaining fluid. Use an additional method of contraception during the first cycle that you take these tablets. If you have any reason to think you are pregnant, stop taking this medicine right away and contact your doctor or health care professional. If you are taking this medicine for hormone related problems, it may take several cycles of use to see improvement in your condition. Do not use this product if you smoke and are over 35 years of age. Smoking increases the risk of getting a blood clot or having a stroke while you are taking birth control pills, especially if you are more than 35 years old. If you are a smoker who is 35 years of age or younger, you are strongly advised not to smoke while taking birth control pills. This medicine can make you more sensitive to the sun. Keep out of the sun. If you cannot avoid being in the sun, wear protective clothing and use sunscreen. Do not use sun lamps or tanning beds/booths. If you wear contact lenses and notice visual changes, or if the lenses begin to feel uncomfortable, consult your eye care specialist. In some women, tenderness, swelling, or minor bleeding of the gums may occur. Notify your dentist if this happens. Brushing and flossing your teeth regularly may help limit  this. See your dentist regularly and inform your dentist of the medicines you are taking. If you are going to have elective surgery, you may need to stop taking this medicine before the surgery. Consult your health care professional for advice. This medicine does not protect you against HIV infection (AIDS) or any other sexually transmitted diseases. What side effects may I notice from receiving this medicine? Side effects that you should report to your doctor or health care professional as soon as possible:  breast tissue changes or discharge  changes in vaginal bleeding during your period or between your periods  chest pain  coughing up blood  dizziness or fainting spells  headaches or migraines  leg, arm or groin pain  severe or sudden headaches  stomach pain (severe)  sudden shortness of breath  sudden loss of coordination, especially on one side of the body  speech problems  symptoms of vaginal infection like itching, irritation or unusual discharge  tenderness in the upper abdomen  vomiting  weakness or numbness in the arms or legs, especially on one side of the body  yellowing of the eyes or skin Side effects that usually do not require medical attention (report to your doctor or health care professional if they continue or are bothersome):  breakthrough bleeding and spotting that continues beyond the 3 initial cycles of pills  breast tenderness  mood changes, anxiety, depression, frustration, anger, or emotional outbursts  increased sensitivity to sun or ultraviolet light  nausea  skin rash, acne, or brown spots on the skin  weight gain (slight) This   list may not describe all possible side effects. Call your doctor for medical advice about side effects. You may report side effects to FDA at 1-800-FDA-1088. Where should I keep my medicine? Keep out of the reach of children. Store at room temperature between 15 and 30 degrees C (59 and 86 degrees F).  Throw away any unused medicine after the expiration date. NOTE: This sheet is a summary. It may not cover all possible information. If you have questions about this medicine, talk to your doctor, pharmacist, or health care provider.  2020 Elsevier/Gold Standard (2016-02-19 08:09:09)   Preventive Care 21-39 Years Old, Female Preventive care refers to visits with your health care provider and lifestyle choices that can promote health and wellness. This includes:  A yearly physical exam. This may also be called an annual well check.  Regular dental visits and eye exams.  Immunizations.  Screening for certain conditions.  Healthy lifestyle choices, such as eating a healthy diet, getting regular exercise, not using drugs or products that contain nicotine and tobacco, and limiting alcohol use. What can I expect for my preventive care visit? Physical exam Your health care provider will check your:  Height and weight. This may be used to calculate body mass index (BMI), which tells if you are at a healthy weight.  Heart rate and blood pressure.  Skin for abnormal spots. Counseling Your health care provider may ask you questions about your:  Alcohol, tobacco, and drug use.  Emotional well-being.  Home and relationship well-being.  Sexual activity.  Eating habits.  Work and work environment.  Method of birth control.  Menstrual cycle.  Pregnancy history. What immunizations do I need?  Influenza (flu) vaccine  This is recommended every year. Tetanus, diphtheria, and pertussis (Tdap) vaccine  You may need a Td booster every 10 years. Varicella (chickenpox) vaccine  You may need this if you have not been vaccinated. Human papillomavirus (HPV) vaccine  If recommended by your health care provider, you may need three doses over 6 months. Measles, mumps, and rubella (MMR) vaccine  You may need at least one dose of MMR. You may also need a second dose. Meningococcal  conjugate (MenACWY) vaccine  One dose is recommended if you are age 19-21 years and a first-year college student living in a residence hall, or if you have one of several medical conditions. You may also need additional booster doses. Pneumococcal conjugate (PCV13) vaccine  You may need this if you have certain conditions and were not previously vaccinated. Pneumococcal polysaccharide (PPSV23) vaccine  You may need one or two doses if you smoke cigarettes or if you have certain conditions. Hepatitis A vaccine  You may need this if you have certain conditions or if you travel or work in places where you may be exposed to hepatitis A. Hepatitis B vaccine  You may need this if you have certain conditions or if you travel or work in places where you may be exposed to hepatitis B. Haemophilus influenzae type b (Hib) vaccine  You may need this if you have certain conditions. You may receive vaccines as individual doses or as more than one vaccine together in one shot (combination vaccines). Talk with your health care provider about the risks and benefits of combination vaccines. What tests do I need?  Blood tests  Lipid and cholesterol levels. These may be checked every 5 years starting at age 20.  Hepatitis C test.  Hepatitis B test. Screening  Diabetes screening. This is done by   checking your blood sugar (glucose) after you have not eaten for a while (fasting).  Sexually transmitted disease (STD) testing.  BRCA-related cancer screening. This may be done if you have a family history of breast, ovarian, tubal, or peritoneal cancers.  Pelvic exam and Pap test. This may be done every 3 years starting at age 79. Starting at age 88, this may be done every 5 years if you have a Pap test in combination with an HPV test. Talk with your health care provider about your test results, treatment options, and if necessary, the need for more tests. Follow these instructions at home: Eating and  drinking   Eat a diet that includes fresh fruits and vegetables, whole grains, lean protein, and low-fat dairy.  Take vitamin and mineral supplements as recommended by your health care provider.  Do not drink alcohol if: ? Your health care provider tells you not to drink. ? You are pregnant, may be pregnant, or are planning to become pregnant.  If you drink alcohol: ? Limit how much you have to 0-1 drink a day. ? Be aware of how much alcohol is in your drink. In the [place] one drink equals one 12 oz bottle of beer (355 mL), one 5 oz glass of wine (148 mL), or one 1 oz glass of hard liquor (44 mL). Lifestyle  Take daily care of your teeth and gums.  Stay active. Exercise for at least 30 minutes on 5 or more days each week.  Do not use any products that contain nicotine or tobacco, such as cigarettes, e-cigarettes, and chewing tobacco. If you need help quitting, ask your health care provider.  If you are sexually active, practice safe sex. Use a condom or other form of birth control (contraception) in order to prevent pregnancy and STIs (sexually transmitted infections). If you plan to become pregnant, see your health care provider for a preconception visit. What's next?  Visit your health care provider once a year for a well check visit.  Ask your health care provider how often you should have your eyes and teeth checked.  Stay up to date on all vaccines. This information is not intended to replace advice given to you by your health care provider. Make sure you discuss any questions you have with your health care provider. Document Revised: 02/19/2018 Document Reviewed: 02/19/2018 Elsevier Patient Education  2020 [employer].

## 2020-04-06 NOTE — Progress Notes (Addendum)
ANNUAL PREVENTATIVE CARE GYN  ENCOUNTER NOTE  Subjective:       Diane [name] is a 34 y.o. G68P1001 female here for a routine annual gynecologic exam.  Current complaints: 1. Fatigue  2. Needs refill of OCP  History of gestational diabetes and hypothyroidism followed by Endocrinology.   Already vaccinated for flu this season.   Denies difficulty breathing or respiratory distress, chest pain, abdominal pain, excessive vaginal bleeding, dysuria, and leg pain or swelling.    Gynecologic History  Patient's last menstrual period was 03/28/2020 (approximate). Period Cycle (Days): 28 Period Duration (Days): 4-5 Period Pattern: Regular Menstrual Flow: Moderate Menstrual Control: Tampon Menstrual Control Change Freq (Hours): 4-5 Dysmenorrhea: (!) Mild Dysmenorrhea Symptoms: Cramping, Throbbing    Upstream - 04/06/20 1022      Pregnancy Intention Screening   Does the patient want to become pregnant in the next year? Ok Either Way    Does the patient's partner want to become pregnant in the next year? Ok Either Way    Would the patient like to discuss contraceptive options today? No      Contraception Wrap Up   Current Method Oral Contraceptive          The pregnancy intention screening data noted above was reviewed. Potential methods of contraception were discussed. The patient elected to proceed with Oral Contraceptive.   Last Pap: 01/2018. Results were: Neg/Neg  Obstetric History  OB History  Gravida Para Term Preterm AB Living  2 2 2     2   SAB TAB Ectopic Multiple Live Births          2    # Outcome Date GA Lbr Len/2nd Weight Sex Delivery Anes PTL Lv  2 Term 06/19/17 [redacted]w[redacted]d    Vag-Spont   LIV  1 Term 08/11/14 [redacted]w[redacted]d   F Vag-Spont None  LIV    Past Medical History:  Diagnosis Date  . Anxiety   . Asthma    activity induced asthma  . Endometriosis   . Gall stones 07/30/2017  . GERD (gastroesophageal reflux disease)   . Gestational diabetes mellitus   .  Hypothyroidism   . Ovarian cyst   . Stress incontinence 04/16/2017  . Thyroiditis   . 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 CHOLECYSTECTOMY N/A 11/28/2017   Procedure: LAPAROSCOPIC CHOLECYSTECTOMY WITH INTRAOPERATIVE CHOLANGIOGRAM;  Surgeon: 01/28/2018, MD;  Location: [facility] ORS;  Service: General;  Laterality: N/A;  . LAPAROSCOPY  2009  . nsvd  2010   x 2    Current Outpatient Medications on File Prior to Visit  Medication Sig Dispense Refill  . albuterol (PROAIR HFA) 108 (90 Base) MCG/ACT inhaler Inhale 2 puffs into the lungs every 6 (six) hours as needed for wheezing or shortness of breath. 1 Inhaler 2  . levothyroxine (SYNTHROID) 50 MCG tablet Take 1 tablet (50 mcg total) by mouth daily before breakfast. 90 tablet 3  . norgestimate-ethinyl estradiol (ESTARYLLA) 0.25-35 MG-MCG tablet Take 1 tablet by mouth daily. 28 tablet 1  . Prenatal Vit-Fe Fumarate-FA (PRENATAL MULTIVITAMIN) TABS tablet Take 1 tablet by mouth daily.      No current facility-administered medications on file prior to visit.    No Known Allergies  Social History   Socioeconomic History  . Marital status: Married    Spouse name: Not on file  . Number of children: Not on file  . Years of education: Not on file  . Highest education level: Not  on file  Occupational History  . Not on file  Tobacco Use  . Smoking status: Never Smoker  . Smokeless tobacco: Never Used  Vaping Use  . Vaping Use: Never used  Substance and Sexual Activity  . Alcohol use: Yes    Comment: occasional  . Drug use: No  . Sexual activity: Yes    Birth control/protection: Pill  Other Topics Concern  . Not on file  Social History Narrative   Married.   2 children.   Masters in Programme researcher, broadcasting/film/video.   Enjoys reading, photography, crafting.    Social Determinants of Health   Financial Resource Strain:   . Difficulty of Paying Living Expenses: Not on file  Food Insecurity:   .  Worried About Programme researcher, broadcasting/film/video in the Last Year: Not on file  . Ran Out of Food in the Last Year: Not on file  Transportation Needs:   . Lack of Transportation (Medical): Not on file  . Lack of Transportation (Non-Medical): Not on file  Physical Activity:   . Days of Exercise per Week: Not on file  . Minutes of Exercise per Session: Not on file  Stress:   . Feeling of Stress : Not on file  Social Connections:   . Frequency of Communication with Friends and Family: Not on file  . Frequency of Social Gatherings with Friends and Family: Not on file  . Attends Religious Services: Not on file  . Active Member of Clubs or Organizations: Not on file  . Attends Banker Meetings: Not on file  . Marital Status: Not on file  Intimate Partner Violence:   . Fear of Current or Ex-Partner: Not on file  . Emotionally Abused: Not on file  . Physically Abused: Not on file  . Sexually Abused: Not on file    Family History  Problem Relation Age of Onset  . Diabetes Father   . Asthma Father   . Hypertension Father   . Heart disease Maternal Grandmother   . Colon cancer Maternal Grandmother   . Breast cancer Maternal Grandmother   . Heart disease Paternal Grandfather   . Pancreatic cancer Paternal Grandmother   . Other Brother   . Thyroid disease Neg Hx     The following portions of the patient's history were reviewed and updated as appropriate: allergies, current medications, past family history, past medical history, past social history, past surgical history and problem list.  Review of Systems  ROS negative except as noted above. Information obtained from patient.    Objective:   BP 109/72   Pulse 76   Ht 5\' 5"  (1.651 m)   Wt 195 lb 4.8 oz (88.6 kg)   LMP 03/28/2020 (Approximate)   BMI 32.50 kg/m   CONSTITUTIONAL: Well-developed, well-nourished female in no acute distress.   PSYCHIATRIC: Normal mood and affect. Normal behavior. Normal judgment and thought  content.  NEUROLGIC: Alert and oriented to person, place, and time. Normal muscle tone coordination. No cranial nerve deficit noted.  HENT:  Normocephalic, atraumatic, External right and left ear normal.  EYES: Conjunctivae and EOM are normal. Pupils are equal and round.   NECK: Normal range of motion, supple, no masses.    SKIN: Skin is warm and dry. No rash noted. Not diaphoretic. No erythema. No pallor.  CARDIOVASCULAR: Normal heart rate noted, regular rhythm, no murmur.  RESPIRATORY: Clear to auscultation bilaterally. Effort and breath sounds normal, no problems with respiration noted.  BREASTS: Symmetric in size. No masses, skin  changes, nipple  drainage, or lymphadenopathy.  ABDOMEN: Soft, normal bowel sounds, no distention noted.  No tenderness, rebound or guarding.   PELVIC:  External Genitalia: Normal  Vagina: Normal  Cervix: Normal  Uterus: Normal  Adnexa: Normal   MUSCULOSKELETAL: Normal range of motion. No tenderness.  No cyanosis, clubbing, or edema.  2+ distal pulses.  LYMPHATIC: No Axillary, Supraclavicular, or Inguinal Adenopathy.   Assessment:   Annual gynecologic examination 34 y.o.   Contraception: OCP (estrogen/progesterone)   Obesity 1   Problem List Items Addressed This Visit      Endocrine   Hypothyroidism     Other   Vitamin D deficiency    Other Visit Diagnoses    Well woman exam    -  Primary      Plan:   Pap: Not needed  Labs: See orders   Routine preventative health maintenance measures emphasized: Exercise/Diet/Weight control, Tobacco Warnings, Alcohol/Substance use risks and Stress Management; see AVS   Rx OCP, see orders  Reviewed red flag symptoms and when to call  Return to Clinic - 1 Year for [facility] or sooner if needed   Serafina Royals, CNM  [facility], [facility] 04/06/20 10:33 AM

## 2020-04-07 LAB — CBC
Hematocrit: 38.6 % (ref 34.0–46.6)
Hemoglobin: 12.4 g/dL (ref 11.1–15.9)
MCH: 27.1 pg (ref 26.6–33.0)
MCHC: 32.1 g/dL (ref 31.5–35.7)
MCV: 85 fL (ref 79–97)
Platelets: 313 10*3/uL (ref 150–450)
RBC: 4.57 x10E6/uL (ref 3.77–5.28)
RDW: 12.1 % (ref 11.7–15.4)
WBC: 5.7 10*3/uL (ref 3.4–10.8)

## 2020-04-07 LAB — HEMOGLOBIN A1C
Est. average glucose Bld gHb Est-mCnc: 117 mg/dL
Hgb A1c MFr Bld: 5.7 % — ABNORMAL HIGH (ref 4.8–5.6)

## 2020-04-07 LAB — VITAMIN D 25 HYDROXY (VIT D DEFICIENCY, FRACTURES): Vit D, 25-Hydroxy: 30.7 ng/mL (ref 30.0–100.0)

## 2020-04-10 ENCOUNTER — Other Ambulatory Visit: Payer: Self-pay | Admitting: Certified Nurse Midwife

## 2020-04-10 DIAGNOSIS — R7303 Prediabetes: Secondary | ICD-10-CM

## 2020-04-10 MED ORDER — ACCU-CHEK SOFTCLIX LANCETS MISC: 1.0000 | Freq: Four times a day (QID) | 12 refills | Status: AC

## 2020-04-10 MED ORDER — ACCU-CHEK SMARTVIEW VI STRP: ORAL_STRIP | 12 refills | Status: AC

## 2020-04-10 NOTE — Progress Notes (Signed)
Rx Lancets and test strips, see orders.    Serafina Royals, CNM [facility], [facility] 04/10/20 5:50 PM

## 2020-05-05 ENCOUNTER — Ambulatory Visit: Payer: BC Managed Care – PPO

## 2020-05-05 ENCOUNTER — Other Ambulatory Visit: Payer: Self-pay

## 2020-05-05 DIAGNOSIS — R319 Hematuria, unspecified: Secondary | ICD-10-CM | POA: Diagnosis not present

## 2020-05-05 LAB — POCT URINALYSIS DIPSTICK
Bilirubin, UA: NEGATIVE
Glucose, UA: NEGATIVE
Ketones, UA: NEGATIVE
Leukocytes, UA: NEGATIVE
Nitrite, UA: NEGATIVE
Protein, UA: NEGATIVE
Spec Grav, UA: 1.02 (ref 1.010–1.025)
Urobilinogen, UA: 0.2 E.U./dL
pH, UA: 5 (ref 5.0–8.0)

## 2020-05-05 MED ORDER — NITROFURANTOIN MONOHYD MACRO 100 MG PO CAPS: 100.0000 mg | ORAL_CAPSULE | Freq: Two times a day (BID) | ORAL | 1 refills | Status: DC

## 2020-05-05 NOTE — Progress Notes (Unsigned)
Patient presents for a NV for c/o frequency and urgency. Urine dip showed moderate amount of blood. Urine sent for culture. Macrobid sent to preferred pharmacy.

## 2020-05-07 LAB — URINE CULTURE

## 2020-07-07 DIAGNOSIS — Z20828 Contact with and (suspected) exposure to other viral communicable diseases: Secondary | ICD-10-CM | POA: Diagnosis not present

## 2020-07-11 ENCOUNTER — Encounter: Payer: Self-pay | Admitting: Family Medicine

## 2020-07-11 ENCOUNTER — Telehealth (INDEPENDENT_AMBULATORY_CARE_PROVIDER_SITE_OTHER): Payer: BC Managed Care – PPO | Admitting: Family Medicine

## 2020-07-11 DIAGNOSIS — J329 Chronic sinusitis, unspecified: Secondary | ICD-10-CM | POA: Insufficient documentation

## 2020-07-11 DIAGNOSIS — J0100 Acute maxillary sinusitis, unspecified: Secondary | ICD-10-CM

## 2020-07-11 MED ORDER — AZITHROMYCIN 250 MG PO TABS: ORAL_TABLET | ORAL | 0 refills | Status: DC

## 2020-07-11 NOTE — Patient Instructions (Signed)
Drink fluids and rest when you can  Try nasal saline spray or netti pot  Afrin nasal spray is ok for congestion (no more than 2 days)  Delsym is good for cough If more mucous can try expectorant like mucinex  I sent in zpack  Update if not starting to improve in a week or if worsening

## 2020-07-11 NOTE — Progress Notes (Signed)
Virtual Visit via Video Note  I connected with [name] on 07/11/20 at  3:30 PM EST by a video enabled telemedicine application and verified that I am speaking with the correct person using two identifiers.  Location: Patient: home Provider: office   I discussed the limitations of evaluation and management by telemedicine and the availability of in person appointments. The patient expressed understanding and agreed to proceed.  Parties involved in encounter  Patient: Diane [name]   Provider:  Roxy Manns MD    History of Present Illness: 35 yo pt of [facility] presents with exposure to covid  Now having symptoms /sinus and thinks she has a sinus infection   Daughter tested pos on Tuesday  Husband negative (even with symptoms) -improved quickly after zpak for sinusitis  Pt did pcr on fri and it was negative (symptoms started Thursday)   Sinus headache and pressure in ears on Th pcr Friday -neg  Symptoms have worsened   Very congested- mucous is clear (not getting a lot)  Sneezing Runny nose  Junky sounding cough-nothing comes up Very tired /simple things wipe her out  Headache  No sob or wheezing   Baseline she stays congested and gets sinus inf easily    Temp has been nl  No body aches or chills   Stomach ok / no n/v/d  No change in taste or smell    Otc: Drinking tea  Delsym  Took ibuprofen yesterday  Drinking lots of fluids    moderna vaccination end of december    Patient Active Problem List   Diagnosis Date Noted  . Sinusitis 07/11/2020  . 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 Muscle spasm 01/07/2020  . 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 Acute wrist pain 07/31/2018  . Preventative health care 05/14/2018  . Vitamin D deficiency 05/14/2018  . Mass of right hand 02/20/2018  . Asthma 02/20/2018  . Stress incontinence 04/16/2017  . Hypothyroidism 03/20/2015   Past Medical History:  Diagnosis Date  . Anxiety   . Asthma    activity induced  asthma  . Endometriosis   . Gall stones 07/30/2017  . GERD (gastroesophageal reflux disease)   . Gestational diabetes mellitus   . Hypothyroidism   . Ovarian cyst   . Stress incontinence 04/16/2017  . Thyroiditis   . 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 CHOLECYSTECTOMY N/A 11/28/2017   Procedure: LAPAROSCOPIC CHOLECYSTECTOMY WITH INTRAOPERATIVE CHOLANGIOGRAM;  Surgeon: Luretha Murphy, MD;  Location: [facility] ORS;  Service: General;  Laterality: N/A;  . LAPAROSCOPY  2009  . nsvd  [phone]   x 2   Social History   Tobacco Use  . Smoking status: Never Smoker  . Smokeless tobacco: Never Used  Vaping Use  . Vaping Use: Never used  Substance Use Topics  . Alcohol use: Yes    Comment: occasional  . Drug use: No   Family History  Problem Relation Age of Onset  . Diabetes Father   . Asthma Father   . Hypertension Father   . Heart disease Maternal Grandmother   . Colon cancer Maternal Grandmother   . Breast cancer Maternal Grandmother   . Heart disease Paternal Grandfather   . Pancreatic cancer Paternal Grandmother   . Other Brother   . Thyroid disease Neg Hx    No Known Allergies Current Outpatient Medications on File Prior to Visit  Medication Sig Dispense Refill  . Accu-Chek Softclix Lancets lancets 1 each by  Other route 4 (four) times daily. 100 each 12  . albuterol (PROAIR HFA) 108 (90 Base) MCG/ACT inhaler Inhale 2 puffs into the lungs every 6 (six) hours as needed for wheezing or shortness of breath. 1 Inhaler 2  . glucose blood (ACCU-CHEK SMARTVIEW) test strip Use as instructed to check blood sugars 100 each 12  . levothyroxine (SYNTHROID) 50 MCG tablet Take 1 tablet (50 mcg total) by mouth daily before breakfast. 90 tablet 3  . norgestimate-ethinyl estradiol (ESTARYLLA) 0.25-35 MG-MCG tablet Take 1 tablet by mouth daily. 84 tablet 4  . Prenatal Vit-Fe Fumarate-FA (PRENATAL MULTIVITAMIN) TABS tablet Take 1 tablet by mouth  daily.      No current facility-administered medications on file prior to visit.   Review of Systems  Constitutional: Positive for malaise/fatigue. Negative for chills and fever.  HENT: Positive for congestion, sinus pain and sore throat. Negative for ear pain.   Eyes: Negative for blurred vision, discharge and redness.  Respiratory: Positive for cough. Negative for sputum production, shortness of breath, wheezing and stridor.   Cardiovascular: Negative for chest pain, palpitations and leg swelling.  Gastrointestinal: Negative for abdominal pain, diarrhea, nausea and vomiting.  Musculoskeletal: Negative for myalgias.  Skin: Negative for rash.  Neurological: Positive for headaches. Negative for dizziness.    Observations/Objective: Patient appears well, in no distress Weight is baseline  No facial swelling or asymmetry Hoarse voice, congested and sniffling constantly  No obvious tremor or mobility impairment Moving neck and UEs normally Able to hear the call well  No wheeze or shortness of breath during interview  Dry cough intermittently  Talkative and mentally sharp with no cognitive changes No skin changes on face or neck , no rash or pallor Affect is normal    Assessment and Plan: Problem List Items Addressed This Visit      Respiratory   Sinusitis    S/p uri with neg covid test  Husband sick at home with non covid cold symptoms  Px zpak (pt req)  Fluids/nasal saline/ 2 d of afrin prn for congestion  Delsym prn  Expectorant if needed  Update if not starting to improve in a week or if worsening   ER parameters reviewed        Relevant Medications   azithromycin (ZITHROMAX Z-PAK) 250 MG tablet       Follow Up Instructions: Drink fluids and rest when you can  Try nasal saline spray or netti pot  Afrin nasal spray is ok for congestion (no more than 2 days)  Delsym is good for cough If more mucous can try expectorant like mucinex  I sent in zpack  Update if not  starting to improve in a week or if worsening    I discussed the assessment and treatment plan with the patient. The patient was provided an opportunity to ask questions and all were answered. The patient agreed with the plan and demonstrated an understanding of the instructions.   The patient was advised to call back or seek an in-person evaluation if the symptoms worsen or if the condition fails to improve as anticipated.     Roxy Manns, MD

## 2020-07-11 NOTE — Assessment & Plan Note (Addendum)
S/p uri with neg covid test  Husband sick at home with non covid cold symptoms  Px zpak (pt req)  Fluids/nasal saline/ 2 d of afrin prn for congestion  Delsym prn  Expectorant if needed  Update if not starting to improve in a week or if worsening   ER parameters reviewed

## 2020-09-05 ENCOUNTER — Ambulatory Visit: Payer: BC Managed Care – PPO | Admitting: Endocrinology

## 2020-09-05 ENCOUNTER — Encounter: Payer: Self-pay | Admitting: Primary Care

## 2020-09-05 ENCOUNTER — Other Ambulatory Visit: Payer: Self-pay

## 2020-09-05 ENCOUNTER — Ambulatory Visit (INDEPENDENT_AMBULATORY_CARE_PROVIDER_SITE_OTHER): Payer: BC Managed Care – PPO | Admitting: Primary Care

## 2020-09-05 VITALS — BP 110/74 | HR 90 | Temp 97.1°F | Ht 65.0 in | Wt 193.0 lb

## 2020-09-05 DIAGNOSIS — Z1159 Encounter for screening for other viral diseases: Secondary | ICD-10-CM

## 2020-09-05 DIAGNOSIS — E559 Vitamin D deficiency, unspecified: Secondary | ICD-10-CM

## 2020-09-05 DIAGNOSIS — Z111 Encounter for screening for respiratory tuberculosis: Secondary | ICD-10-CM | POA: Diagnosis not present

## 2020-09-05 DIAGNOSIS — E039 Hypothyroidism, unspecified: Secondary | ICD-10-CM

## 2020-09-05 DIAGNOSIS — Z0000 Encounter for general adult medical examination without abnormal findings: Secondary | ICD-10-CM

## 2020-09-05 DIAGNOSIS — J4520 Mild intermittent asthma, uncomplicated: Secondary | ICD-10-CM

## 2020-09-05 DIAGNOSIS — N393 Stress incontinence (female) (male): Secondary | ICD-10-CM

## 2020-09-05 DIAGNOSIS — R7303 Prediabetes: Secondary | ICD-10-CM

## 2020-09-05 LAB — VITAMIN D 25 HYDROXY (VIT D DEFICIENCY, FRACTURES): VITD: 27.66 ng/mL — ABNORMAL LOW (ref 30.00–100.00)

## 2020-09-05 LAB — COMPREHENSIVE METABOLIC PANEL
ALT: 8 U/L (ref 0–35)
AST: 8 U/L (ref 0–37)
Albumin: 4 g/dL (ref 3.5–5.2)
Alkaline Phosphatase: 54 U/L (ref 39–117)
BUN: 9 mg/dL (ref 6–23)
CO2: 29 mEq/L (ref 19–32)
Calcium: 9.1 mg/dL (ref 8.4–10.5)
Chloride: 102 mEq/L (ref 96–112)
Creatinine, Ser: 0.73 mg/dL (ref 0.40–1.20)
GFR: 106.58 mL/min (ref >60.00)
Glucose, Bld: 94 mg/dL (ref 70–99)
Potassium: 4 mEq/L (ref 3.5–5.1)
Sodium: 138 mEq/L (ref 135–145)
Total Bilirubin: 0.3 mg/dL (ref 0.2–1.2)
Total Protein: 6.9 g/dL (ref 6.0–8.3)

## 2020-09-05 LAB — LIPID PANEL
Cholesterol: 204 mg/dL — ABNORMAL HIGH (ref 0–200)
HDL: 62.8 mg/dL (ref >39.00)
LDL Cholesterol: 117 mg/dL — ABNORMAL HIGH (ref 0–99)
NonHDL: 140.76
Total CHOL/HDL Ratio: 3
Triglycerides: 118 mg/dL (ref 0.0–149.0)
VLDL: 23.6 mg/dL (ref 0.0–40.0)

## 2020-09-05 LAB — CBC
HCT: 36.1 % (ref 36.0–46.0)
Hemoglobin: 12.1 g/dL (ref 12.0–15.0)
MCHC: 33.6 g/dL (ref 30.0–36.0)
MCV: 83.2 fl (ref 78.0–100.0)
Platelets: 272 10*3/uL (ref 150.0–400.0)
RBC: 4.34 Mil/uL (ref 3.87–5.11)
RDW: 13 % (ref 11.5–15.5)
WBC: 5.7 10*3/uL (ref 4.0–10.5)

## 2020-09-05 LAB — HEMOGLOBIN A1C: Hgb A1c MFr Bld: 5.6 % (ref 4.6–6.5)

## 2020-09-05 NOTE — Assessment & Plan Note (Signed)
Immunizations UTD.  Pap smear UTD, due in August 2022, follows with GYN.   Discussed the importance of a healthy diet and regular exercise in order for weight loss, and to reduce the risk of any potential medical problems.  Exam today unremarkable. Labs pending and also reviewed.

## 2020-09-05 NOTE — Assessment & Plan Note (Signed)
Overall stable, no concerns today. 

## 2020-09-05 NOTE — Progress Notes (Signed)
Subjective:    Patient ID: Diane [name], female    DOB: [birth year], 35 y.o.   MRN: [id number]  HPI  Diane [name] is a very pleasant 35 y.o. female who presents today for complete physical.  Immunizations: -Tetanus: 2018 -Influenza: Completed this season  -Covid-19: Completed 3 vaccines  Diet: She endorses a healthy diet.  Exercise: No regular exercise.  Eye exam: Completes annually  Dental exam: Completes semi-annually  Pap Smear: August 2019, follows with GYN  BP Readings from Last 3 Encounters:  09/05/20 110/74  04/06/20 109/72  03/08/20 122/84      Review of Systems  Constitutional: Negative for unexpected weight change.  HENT: Negative for rhinorrhea.   Respiratory: Negative for cough and shortness of breath.   Cardiovascular: Negative for chest pain.  Gastrointestinal: Negative for constipation and diarrhea.  Genitourinary: Negative for difficulty urinating and menstrual problem.  Musculoskeletal: Negative for arthralgias and myalgias.  Skin: Negative for rash.  Allergic/Immunologic: Negative for environmental allergies.  Neurological: Negative for dizziness, numbness and headaches.  Psychiatric/Behavioral:       Chronic anxiety, able to manage on her own.         Past Medical History:  Diagnosis Date  . Anxiety   . Asthma    activity induced asthma  . Endometriosis   . Gall stones 07/30/2017  . GERD (gastroesophageal reflux disease)   . Gestational diabetes mellitus   . Hypothyroidism   . Ovarian cyst   . Stress incontinence 04/16/2017  . Thyroiditis   . 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 Marital status: Married    Spouse name: Not on file  . Number of children: Not on file  . Years of education: Not on file  . Highest education level: Not on file  Occupational History  . Not on file  Tobacco Use  . Smoking status: Never Smoker  . Smokeless tobacco: Never Used  Vaping Use  .  Vaping Use: Never used  Substance and Sexual Activity  . Alcohol use: Yes    Comment: occasional  . Drug use: No  . Sexual activity: Yes    Birth control/protection: Pill  Other Topics Concern  . Not on file  Social History Narrative   Married.   2 children.   Masters in Programme researcher, broadcasting/film/video.   Enjoys reading, photography, crafting.    Social Determinants of Health   Financial Resource Strain: Not on file  Food Insecurity: Not on file  Transportation Needs: Not on file  Physical Activity: Not on file  Stress: Not on file  Social Connections: Not on file  Intimate Partner Violence: Not on file    Past Surgical History:  Procedure Laterality Date  . CHOLECYSTECTOMY N/A 11/28/2017   Procedure: LAPAROSCOPIC CHOLECYSTECTOMY WITH INTRAOPERATIVE CHOLANGIOGRAM;  Surgeon: Luretha Murphy, MD;  Location: [facility] ORS;  Service: General;  Laterality: N/A;  . LAPAROSCOPY  2009  . nsvd  [phone]   x 2    Family History  Problem Relation Age of Onset  . Diabetes Father   . Asthma Father   . Hypertension Father   . Heart disease Maternal Grandmother   . Colon cancer Maternal Grandmother   . Breast cancer Maternal Grandmother   . Heart disease Paternal Grandfather   . Pancreatic cancer Paternal Grandmother   . Other Brother   . Thyroid disease Neg Hx     No Known Allergies  Current Outpatient Medications on File Prior to Visit  Medication Sig Dispense Refill  . Accu-Chek Softclix Lancets lancets 1 each by Other route 4 (four) times daily. 100 each 12  . albuterol (PROAIR HFA) 108 (90 Base) MCG/ACT inhaler Inhale 2 puffs into the lungs every 6 (six) hours as needed for wheezing or shortness of breath. 1 Inhaler 2  . glucose blood (ACCU-CHEK SMARTVIEW) test strip Use as instructed to check blood sugars 100 each 12  . levothyroxine (SYNTHROID) 50 MCG tablet Take 1 tablet (50 mcg total) by mouth daily before breakfast. 90 tablet 3  . norgestimate-ethinyl estradiol (ESTARYLLA) 0.25-35 MG-MCG  tablet Take 1 tablet by mouth daily. 84 tablet 4  . Prenatal Vit-Fe Fumarate-FA (PRENATAL MULTIVITAMIN) TABS tablet Take 1 tablet by mouth daily.      No current facility-administered medications on file prior to visit.    BP 110/74   Pulse 90   Temp (!) 97.1 F (36.2 C) (Temporal)   Ht 5\' 5"  (1.651 m)   Wt 193 lb (87.5 kg)   SpO2 97%   BMI 32.12 kg/m  Objective:   Physical Exam HENT:     Right Ear: Tympanic membrane and ear canal normal.     Left Ear: Tympanic membrane and ear canal normal.     Nose: Nose normal.  Eyes:     Conjunctiva/sclera: Conjunctivae normal.     Pupils: Pupils are equal, round, and reactive to light.  Neck:     Thyroid: No thyromegaly.  Cardiovascular:     Rate and Rhythm: Normal rate and regular rhythm.     Heart sounds: No murmur heard.   Pulmonary:     Effort: Pulmonary effort is normal.     Breath sounds: Normal breath sounds. No rales.  Abdominal:     General: Bowel sounds are normal.     Palpations: Abdomen is soft.     Tenderness: There is no abdominal tenderness.  Musculoskeletal:        General: Normal range of motion.     Cervical back: Neck supple.  Lymphadenopathy:     Cervical: No cervical adenopathy.  Skin:    General: Skin is warm and dry.     Findings: No rash.  Neurological:     Mental Status: She is alert and oriented to person, place, and time.     Cranial Nerves: No cranial nerve deficit.     Deep Tendon Reflexes: Reflexes are normal and symmetric.  Psychiatric:        Mood and Affect: Mood normal.           Assessment & Plan:      This visit occurred during the SARS-CoV-2 public health emergency.  Safety protocols were in place, including screening questions prior to the visit, additional usage of staff PPE, and extensive cleaning of exam room while observing appropriate contact time as indicated for disinfecting solutions.

## 2020-09-05 NOTE — Assessment & Plan Note (Signed)
Well controlled on PRN albuterol, infrequent use.

## 2020-09-05 NOTE — Progress Notes (Signed)
PPD Placement note Diane [name], 35 y.o. female is here today for placement of PPD test Reason for PPD test: work  Pt taken PPD test before: yes Verified in allergy area and with patient that they are not allergic to the products PPD is made of (Phenol or Tween). Yes Is patient taking any oral or IV steroid medication now or have they taken it in the last month? no Has the patient ever received the BCG vaccine?: no Has the patient been in recent contact with anyone known or suspected of having active TB disease?: no      Date of exposure (if applicable): n/a      Name of person they were exposed to (if applicable):  Patient's Country of origin?: [place] O: Alert and oriented in NAD. P:  PPD placed on 09/05/2020.  Patient advised to return for reading within 48-72 hours.  Nurse visit made with patient

## 2020-09-05 NOTE — Patient Instructions (Signed)
Stop by the lab prior to leaving today. I will notify you of your results once received.   Please return for the TB skin test read as instructed.  Start exercising. You should be getting 150 minutes of moderate intensity exercise weekly.  Continue to work on a healthy diet. Ensure you are consuming 64 ounces of water daily.  It was a pleasure to see you today!   Preventive Care 44-36 Years Old, Female Preventive care refers to lifestyle choices and visits with your health care provider that can promote health and wellness. This includes:  A yearly physical exam. This is also called an annual wellness visit.  Regular dental and eye exams.  Immunizations.  Screening for certain conditions.  Healthy lifestyle choices, such as: ? Eating a healthy diet. ? Getting regular exercise. ? Not using drugs or products that contain nicotine and tobacco. ? Limiting alcohol use. What can I expect for my preventive care visit? Physical exam Your health care provider may check your:  Height and weight. These may be used to calculate your BMI (body mass index). BMI is a measurement that tells if you are at a healthy weight.  Heart rate and blood pressure.  Body temperature.  Skin for abnormal spots. Counseling Your health care provider may ask you questions about your:  Past medical problems.  Family's medical history.  Alcohol, tobacco, and drug use.  Emotional well-being.  Home life and relationship well-being.  Sexual activity.  Diet, exercise, and sleep habits.  Work and work Statistician.  Access to firearms.  Method of birth control.  Menstrual cycle.  Pregnancy history. What immunizations do I need? Vaccines are usually given at various ages, according to a schedule. Your health care provider will recommend vaccines for you based on your age, medical history, and lifestyle or other factors, such as travel or where you work.   What tests do I need? Blood  tests  Lipid and cholesterol levels. These may be checked every 5 years starting at age 54.  Hepatitis C test.  Hepatitis B test. Screening  Diabetes screening. This is done by checking your blood sugar (glucose) after you have not eaten for a while (fasting).  STD (sexually transmitted disease) testing, if you are at risk.  BRCA-related cancer screening. This may be done if you have a family history of breast, ovarian, tubal, or peritoneal cancers.  Pelvic exam and Pap test. This may be done every 3 years starting at age 54. Starting at age 24, this may be done every 5 years if you have a Pap test in combination with an HPV test. Talk with your health care provider about your test results, treatment options, and if necessary, the need for more tests.   Follow these instructions at home: Eating and drinking  Eat a healthy diet that includes fresh fruits and vegetables, whole grains, lean protein, and low-fat dairy products.  Take vitamin and mineral supplements as recommended by your health care provider.  Do not drink alcohol if: ? Your health care provider tells you not to drink. ? You are pregnant, may be pregnant, or are planning to become pregnant.  If you drink alcohol: ? Limit how much you have to 0-1 drink a day. ? Be aware of how much alcohol is in your drink. In the [place] one drink equals one 12 oz bottle of beer (355 mL), one 5 oz glass of wine (148 mL), or one 1 oz glass of hard liquor (44 mL).  Lifestyle  Take daily care of your teeth and gums. Brush your teeth every morning and night with fluoride toothpaste. Floss one time each day.  Stay active. Exercise for at least 30 minutes 5 or more days each week.  Do not use any products that contain nicotine or tobacco, such as cigarettes, e-cigarettes, and chewing tobacco. If you need help quitting, ask your health care provider.  Do not use drugs.  If you are sexually active, practice safe sex. Use a condom or  other form of protection to prevent STIs (sexually transmitted infections).  If you do not wish to become pregnant, use a form of birth control. If you plan to become pregnant, see your health care provider for a prepregnancy visit.  Find healthy ways to cope with stress, such as: ? Meditation, yoga, or listening to music. ? Journaling. ? Talking to a trusted person. ? Spending time with friends and family. Safety  Always wear your seat belt while driving or riding in a vehicle.  Do not drive: ? If you have been drinking alcohol. Do not ride with someone who has been drinking. ? When you are tired or distracted. ? While texting.  Wear a helmet and other protective equipment during sports activities.  If you have firearms in your house, make sure you follow all gun safety procedures.  Seek help if you have been physically or sexually abused. What's next?  Go to your health care provider once a year for an annual wellness visit.  Ask your health care provider how often you should have your eyes and teeth checked.  Stay up to date on all vaccines. This information is not intended to replace advice given to you by your health care provider. Make sure you discuss any questions you have with your health care provider. Document Revised: 02/06/2020 Document Reviewed: 02/19/2018 Elsevier Patient Education  2021 [employer].

## 2020-09-05 NOTE — Assessment & Plan Note (Signed)
She is taking levothyroxine at night, follows with endocrinology. Continue levothyroxine 50 mcg. Recent TSH level reviewed.

## 2020-09-06 LAB — HEPATITIS C ANTIBODY
Hepatitis C Ab: NONREACTIVE
SIGNAL TO CUT-OFF: 0.01 (ref <1.00)

## 2020-09-06 NOTE — Addendum Note (Signed)
Addended by: Donnamarie Poag on: 09/06/2020 03:18 PM   Modules accepted: Orders

## 2020-09-07 ENCOUNTER — Other Ambulatory Visit (INDEPENDENT_AMBULATORY_CARE_PROVIDER_SITE_OTHER): Payer: BC Managed Care – PPO

## 2020-09-07 ENCOUNTER — Other Ambulatory Visit: Payer: Self-pay

## 2020-09-07 DIAGNOSIS — Z1159 Encounter for screening for other viral diseases: Secondary | ICD-10-CM

## 2020-09-08 LAB — MEASLES/MUMPS/RUBELLA IMMUNITY
Mumps IgG: 40.8 AU/mL
Rubella: 1.81 Index
Rubeola IgG: <13.5 AU/mL — ABNORMAL LOW

## 2020-09-08 LAB — HEPATITIS B SURFACE ANTIBODY,QUALITATIVE: Hep B S Ab: BORDERLINE — AB

## 2020-09-11 ENCOUNTER — Encounter: Payer: Self-pay | Admitting: Primary Care

## 2020-09-11 ENCOUNTER — Telehealth: Payer: Self-pay | Admitting: Primary Care

## 2020-09-11 NOTE — Telephone Encounter (Signed)
Error

## 2020-09-12 ENCOUNTER — Ambulatory Visit: Payer: BC Managed Care – PPO

## 2020-10-02 ENCOUNTER — Ambulatory Visit: Payer: BC Managed Care – PPO | Admitting: Endocrinology

## 2020-10-18 ENCOUNTER — Ambulatory Visit: Payer: BC Managed Care – PPO | Admitting: Endocrinology

## 2020-10-18 ENCOUNTER — Other Ambulatory Visit: Payer: Self-pay

## 2020-10-18 VITALS — BP 110/72 | HR 86 | Ht 65.0 in | Wt 195.4 lb

## 2020-10-18 DIAGNOSIS — E039 Hypothyroidism, unspecified: Secondary | ICD-10-CM

## 2020-10-18 LAB — T4, FREE: Free T4: 0.78 ng/dL (ref 0.60–1.60)

## 2020-10-18 LAB — TSH: TSH: 0.97 u[IU]/mL (ref 0.35–4.50)

## 2020-10-18 NOTE — Patient Instructions (Addendum)
Blood tests are requested for you today.  We'll let you know about the results.  It is best to never miss the medication.  However, if you do miss it, next best is to double up the next time. Please come back for a follow-up appointment in 9 months.

## 2020-10-18 NOTE — Progress Notes (Signed)
Subjective:    Patient ID: Diane [name], female    DOB: July 17, 1985, 35 y.o.   MRN: [id number]  HPI Pt returns for f/u of subacute thyroiditis (dx'ed mid-2016, when she presented with hyperthyroidism; she has never had thyroid imaging; she had births in 2016 and 2018; she was rx'ed synthroid in late 2016, due to the spontaneous evolution of hypothyroidism).  She is not at risk for pregnancy now, but may reconsider in the future.  Main symptoms are hair loss and fatigue.  She takes synthroid as rx'ed.  She has also lost a few lbs recently.   Past Medical History:  Diagnosis Date  . Anxiety   . Asthma    activity induced asthma  . Endometriosis   . Gall stones 07/30/2017  . GERD (gastroesophageal reflux disease)   . Gestational diabetes mellitus   . Hypothyroidism   . Ovarian cyst   . Stress incontinence 04/16/2017  . Thyroiditis   . 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 CHOLECYSTECTOMY N/A 11/28/2017   Procedure: LAPAROSCOPIC CHOLECYSTECTOMY WITH INTRAOPERATIVE CHOLANGIOGRAM;  Surgeon: Luretha Murphy, MD;  Location: [facility] ORS;  Service: General;  Laterality: N/A;  . LAPAROSCOPY  2009  . nsvd  [phone]   x 2    Social History   Socioeconomic History  . Marital status: Married    Spouse name: Not on file  . Number of children: Not on file  . Years of education: Not on file  . Highest education level: Not on file  Occupational History  . Not on file  Tobacco Use  . Smoking status: Never Smoker  . Smokeless tobacco: Never Used  Vaping Use  . Vaping Use: Never used  Substance and Sexual Activity  . Alcohol use: Yes    Comment: occasional  . Drug use: No  . Sexual activity: Yes    Birth control/protection: Pill  Other Topics Concern  . Not on file  Social History Narrative   Married.   2 children.   Masters in Programme researcher, broadcasting/film/video.   Enjoys reading, photography, crafting.    Social Determinants of Health   Financial  Resource Strain: Not on file  Food Insecurity: Not on file  Transportation Needs: Not on file  Physical Activity: Not on file  Stress: Not on file  Social Connections: Not on file  Intimate Partner Violence: Not on file    Current Outpatient Medications on File Prior to Visit  Medication Sig Dispense Refill  . Accu-Chek Softclix Lancets lancets 1 each by Other route 4 (four) times daily. 100 each 12  . albuterol (PROAIR HFA) 108 (90 Base) MCG/ACT inhaler Inhale 2 puffs into the lungs every 6 (six) hours as needed for wheezing or shortness of breath. 1 Inhaler 2  . glucose blood (ACCU-CHEK SMARTVIEW) test strip Use as instructed to check blood sugars 100 each 12  . levothyroxine (SYNTHROID) 50 MCG tablet Take 1 tablet (50 mcg total) by mouth daily before breakfast. 90 tablet 3  . norgestimate-ethinyl estradiol (ESTARYLLA) 0.25-35 MG-MCG tablet Take 1 tablet by mouth daily. 84 tablet 4  . Prenatal Vit-Fe Fumarate-FA (PRENATAL MULTIVITAMIN) TABS tablet Take 1 tablet by mouth daily.      No current facility-administered medications on file prior to visit.    No Known Allergies  Family History  Problem Relation Age of Onset  . Diabetes Father   . Asthma Father   . Hypertension Father   . Heart disease Maternal Grandmother   .  Colon cancer Maternal Grandmother   . Breast cancer Maternal Grandmother   . Heart disease Paternal Grandfather   . Pancreatic cancer Paternal Grandmother   . Other Brother   . Thyroid disease Neg Hx     BP 110/72 (BP Location: Right Arm, Patient Position: Sitting, Cuff Size: Large)   Pulse 86   Ht 5\' 5"  (1.651 m)   Wt 195 lb 6.4 oz (88.6 kg)   SpO2 97%   BMI 32.52 kg/m    Review of Systems     Objective:   Physical Exam VITAL SIGNS:  See vs page GENERAL: no distress   Lab Results  Component Value Date   TSH 0.97 10/18/2020   T4TOTAL 4.0 (L) 02/02/2018      Assessment & Plan:  Hypothyroidism: well-controlled.  Please continue the same  synthroid.

## 2020-11-27 DIAGNOSIS — J4520 Mild intermittent asthma, uncomplicated: Secondary | ICD-10-CM

## 2020-11-27 MED ORDER — ALBUTEROL SULFATE HFA 108 (90 BASE) MCG/ACT IN AERS: 2.0000 | INHALATION_SPRAY | Freq: Four times a day (QID) | RESPIRATORY_TRACT | 0 refills | Status: DC | PRN

## 2020-11-27 NOTE — Telephone Encounter (Signed)
Patient last CPE was 09/05/2020.

## 2020-12-10 ENCOUNTER — Other Ambulatory Visit: Payer: Self-pay | Admitting: Endocrinology

## 2020-12-10 DIAGNOSIS — E039 Hypothyroidism, unspecified: Secondary | ICD-10-CM

## 2020-12-16 ENCOUNTER — Encounter: Payer: Self-pay | Admitting: Endocrinology

## 2021-01-30 ENCOUNTER — Other Ambulatory Visit: Payer: Self-pay

## 2021-01-30 ENCOUNTER — Encounter: Payer: Self-pay | Admitting: Certified Nurse Midwife

## 2021-01-30 ENCOUNTER — Ambulatory Visit: Payer: BC Managed Care – PPO | Admitting: Certified Nurse Midwife

## 2021-01-30 VITALS — BP 121/79 | HR 84 | Ht 65.0 in | Wt 195.6 lb

## 2021-01-30 DIAGNOSIS — R399 Unspecified symptoms and signs involving the genitourinary system: Secondary | ICD-10-CM | POA: Diagnosis not present

## 2021-01-30 DIAGNOSIS — R300 Dysuria: Secondary | ICD-10-CM | POA: Diagnosis not present

## 2021-01-30 LAB — POCT URINALYSIS DIPSTICK
Bilirubin, UA: NEGATIVE
Glucose, UA: NEGATIVE
Ketones, UA: NEGATIVE
Nitrite, UA: NEGATIVE
Protein, UA: POSITIVE — AB
Spec Grav, UA: 1.015 (ref 1.010–1.025)
Urobilinogen, UA: 0.2 E.U./dL
pH, UA: 6.5 (ref 5.0–8.0)

## 2021-01-30 MED ORDER — NITROFURANTOIN MONOHYD MACRO 100 MG PO CAPS: 100.0000 mg | ORAL_CAPSULE | Freq: Two times a day (BID) | ORAL | 0 refills | Status: AC

## 2021-01-30 NOTE — Progress Notes (Signed)
Subjective:    Diane [name] is a 35 y.o. female who complains of abnormal smelling urine, dysuria, and cloudy urine  for 2 day.  . Patient denies back pain and fever.  Patient does have a history of UTI in the past.  Patient does not have a history of pyelonephritis. The following portions of the patient's history were reviewed and updated as appropriate: allergies, current medications, past family history, past medical history, past social history, past surgical history, and problem list. Review of Systems Pertinent items are noted in HPI.    Objective:    There were no vitals taken for this visit. General: alert, cooperative, appears stated age, and no distress  Abdomen: soft, non-tender, without masses or organomegaly in the entire abdomen  Back: back muscles are full ROM, CVA tenderness absent  GU: defer exam   Laboratory:  Urine dipstick shows 2+ for leukocyte esterase, trace for protein, and large blood .      Assessment:    Dysuria     Plan: Plan:    1. Medications: nitrofurantoin 2. Maintain adequate hydration 3.urine culture 4. Follow up if symptoms not improving, and prn.   Doreene Burke, CNM

## 2021-02-02 LAB — URINE CULTURE

## 2021-04-09 ENCOUNTER — Encounter: Payer: BC Managed Care – PPO | Admitting: Certified Nurse Midwife

## 2021-06-14 ENCOUNTER — Other Ambulatory Visit: Payer: Self-pay

## 2021-06-14 ENCOUNTER — Telehealth: Payer: Self-pay | Admitting: Certified Nurse Midwife

## 2021-06-14 MED ORDER — NORGESTIMATE-ETH ESTRADIOL 0.25-35 MG-MCG PO TABS: 1.0000 | ORAL_TABLET | Freq: Every day | ORAL | 0 refills | Status: DC

## 2021-06-14 NOTE — Telephone Encounter (Signed)
Pt is calling in stating that she will be out of her birth control on next week and would like to see if she can get enough until she come in for her annual physical with Melissa 07/05/2021 at 9:00.  Pharm:  [facility] on [address] and [place].  Pt would like to have a call back to let her know when it is called in.

## 2021-06-14 NOTE — Telephone Encounter (Signed)
Holdover refill sent.

## 2021-07-05 ENCOUNTER — Other Ambulatory Visit (HOSPITAL_COMMUNITY)
Admission: RE | Admit: 2021-07-05 | Discharge: 2021-07-05 | Disposition: A | Discharge status: Home / Self Care | Payer: BC Managed Care – PPO | Source: Ambulatory Visit | Attending: Obstetrics | Admitting: Obstetrics

## 2021-07-05 ENCOUNTER — Ambulatory Visit (INDEPENDENT_AMBULATORY_CARE_PROVIDER_SITE_OTHER): Payer: BC Managed Care – PPO | Admitting: Obstetrics

## 2021-07-05 ENCOUNTER — Encounter: Payer: Self-pay | Admitting: Obstetrics

## 2021-07-05 ENCOUNTER — Other Ambulatory Visit: Payer: Self-pay

## 2021-07-05 VITALS — BP 110/76 | HR 82 | Ht 65.0 in | Wt 185.0 lb

## 2021-07-05 DIAGNOSIS — K529 Noninfective gastroenteritis and colitis, unspecified: Secondary | ICD-10-CM

## 2021-07-05 DIAGNOSIS — Z01419 Encounter for gynecological examination (general) (routine) without abnormal findings: Secondary | ICD-10-CM

## 2021-07-05 DIAGNOSIS — Z124 Encounter for screening for malignant neoplasm of cervix: Secondary | ICD-10-CM | POA: Insufficient documentation

## 2021-07-05 NOTE — Progress Notes (Signed)
SUBJECTIVE  HPI  Diane [name] is a 36 y.o.-year-old female who presents for an annual physical today. She feels well overall. Her periods are regular on OCPs with normal bleeding, and she feels this method is working well for her. She has been experiencing some intermenstrual cramping without spotting. It is relieved with a heating pad. Her concerns today include frequent diarrhea and anxiety.      She reports chronic diarrhea with urgency that happens multiple times a week. She states that sometimes it is dietary-related, and other times there is no identifiable trigger. She is interested in a GI referral.  Diane [name] has a history anxiety with occasional panic attacks. Her anxiety is triggered by health concerns. She recently lost her father unexpectedly, and her anxiety has worsened since then. She states she has had 3-4 panic attacks in the month since she lost her father. She reports that she is able to manage her symptoms at this time with positive self-talk and calming exercises. She is interested in speaking with a grief counselor.   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 She has regular periods every 28 days that last 4-5 days.  Current Medications Outpatient Medications Prior to Visit  Medication Sig   levothyroxine (SYNTHROID) 50 MCG tablet TAKE 1 TABLET(50 MCG) BY MOUTH DAILY BEFORE BREAKFAST   norgestimate-ethinyl estradiol (ESTARYLLA) 0.25-35 MG-MCG tablet Take 1 tablet by mouth daily.   Accu-Chek Softclix Lancets lancets 1 each by Other route 4 (four) times daily. (Patient not taking: Reported on 07/05/2021)   albuterol (PROAIR HFA) 108 (90 Base) MCG/ACT inhaler Inhale 2 puffs into the lungs every 6 (six) hours as needed for wheezing or shortness of breath. (Patient not taking: Reported on 07/05/2021)   glucose blood (ACCU-CHEK SMARTVIEW) test strip Use as instructed to check blood sugars (Patient not taking: Reported on 07/05/2021)   Prenatal Vit-Fe Fumarate-FA (PRENATAL MULTIVITAMIN) TABS tablet Take 1 tablet by mouth daily.  (Patient not taking: Reported on 07/05/2021)   No facility-administered medications prior to visit.      Upstream - 07/05/21 0954       Pregnancy Intention Screening   Does the patient want to  become pregnant in the next year? Unsure    Does the patient's partner want to become pregnant in the next year? Unsure    Would the patient like to discuss contraceptive options today? No      Contraception Wrap Up   Current Method Oral Contraceptive    End Method Oral Contraceptive    Contraception Counseling Provided No            The pregnancy intention screening data noted above was reviewed. Potential methods of contraception were discussed. The patient elected to proceed with Oral Contraceptive.   ROS History obtained from the patient General ROS: negative for - chills, fatigue, night sweats, sleep  disturbance, weight gain, or weight loss Psychological ROS: positive for - anxiety and panic attacks negative for - depression ENT ROS: negative for - headaches, hearing change, nasal congestion, nasal discharge, sinus pain, sore throat, or visual changes Allergy and Immunology ROS: negative for - seasonal allergies Hematological and Lymphatic ROS: negative for - swollen lymph nodes Endocrine ROS: negative for - breast changes or polydipsia/polyuria Breast ROS: negative for breast lumps Respiratory ROS: no cough, shortness of breath, or wheezing Cardiovascular ROS: no chest pain or dyspnea on exertion Has occasional "flutters" that are self-limited and are not accompanied by other symptoms Gastrointestinal ROS: positive for - diarrhea, gas/bloating, and urgency negative for - abdominal pain, constipation, or stool incontinence Genito-Urinary ROS: negative for - dysuria, genital discharge, incontinence, or irregular/heavy menses Musculoskeletal ROS: negative for - joint pain or muscle pain Neurological ROS: negative for - confusion or dizziness Dermatological ROS: negative  Depression screen [facility] 2/9 09/05/2020 07/31/2017  Decreased Interest 0 0  Down, Depressed, Hopeless 0 0  PHQ - 2 Score 0 0  Altered sleeping 0 0  Tired, decreased energy 0 0  Change in appetite 0 0  Feeling bad or failure about yourself  0 0  Trouble concentrating 0 0  Moving slowly or fidgety/restless 0 0  Suicidal thoughts 0 0  PHQ-9 Score 0 0  Difficult doing work/chores Not difficult at all -     OBJECTIVE  Last Weight  Most recent update: 07/05/2021  8:59 AM    Weight  83.9 kg (185 lb)             Body mass index is 30.79 kg/m.   Last Pap:Was collected in 2019 but results are not available. Diane [name] is unsure of results.  General appearance: alert, cooperative, and appears stated age Head: Normocephalic, without obvious abnormality, atraumatic Neck: no adenopathy, supple, symmetrical, trachea  midline, and thyroid not enlarged, symmetric, no tenderness/mass/nodules Back: range of motion normal, symmetric Lungs: clear to auscultation bilaterally Breasts: normal appearance, no masses or tenderness, No nipple discharge or bleeding, No axillary or supraclavicular adenopathy Heart: regular rate and rhythm, S1, S2 normal, no murmur, click, rub or gallop Abdomen: normal findings: no masses palpable, no organomegaly, and soft, non-tender and abnormal findings:  hyperactive bowel sounds Pelvic: cervix normal in appearance, external genitalia normal, no adnexal masses or tenderness, no cervical motion tenderness, rectovaginal septum normal, uterus normal size, shape, and consistency, and vagina normal without discharge Extremities: extremities normal, atraumatic, no cyanosis or edema Pulses: 2+ and symmetric Skin: Skin color, texture, turgor normal. No rashes or lesions Lymph nodes: Cervical, supraclavicular, and axillary nodes normal.  ASSESSMENT   1) Annual exam 2) Pap due 3) Chronic diarrhea 4) Anxiety with panic attacks, exacerbated by grief 5) Hypothyroid  PLAN  1) Physical exam completed as noted above. CBC, Vitamin  D, HgA1C ordered 2) Pap with HPV collected. Follow up based on results. 3) Discussed dietary component to diarrhea. Referral to GI for further evaluation. 4) Offered referral for counseling, medication. Not interested in seeking regular therapy or medication at this time, but would like information on grief counselor. Discussed herbal, behavioral, and social support for anxiety and panic. 5) Hypothyroid managed by endocrinology. No new symptoms at this time. TSH/T4 drawn 10/18/20 were normal.  Unable to stay for lab work today. Will make a lab-only appointment. Return in one year for annual exam or PRN for concerns.  Lloyd Huger, CNM

## 2021-07-06 ENCOUNTER — Other Ambulatory Visit: Payer: BC Managed Care – PPO

## 2021-07-06 DIAGNOSIS — Z01419 Encounter for gynecological examination (general) (routine) without abnormal findings: Secondary | ICD-10-CM | POA: Diagnosis not present

## 2021-07-07 LAB — CBC
Hematocrit: 35.9 % (ref 34.0–46.6)
Hemoglobin: 11.9 g/dL (ref 11.1–15.9)
MCH: 27 pg (ref 26.6–33.0)
MCHC: 33.1 g/dL (ref 31.5–35.7)
MCV: 82 fL (ref 79–97)
Platelets: 283 10*3/uL (ref 150–450)
RBC: 4.4 x10E6/uL (ref 3.77–5.28)
RDW: 12.6 % (ref 11.7–15.4)
WBC: 4.7 10*3/uL (ref 3.4–10.8)

## 2021-07-07 LAB — HEMOGLOBIN A1C
Est. average glucose Bld gHb Est-mCnc: 117 mg/dL
Hgb A1c MFr Bld: 5.7 % — ABNORMAL HIGH (ref 4.8–5.6)

## 2021-07-07 LAB — VITAMIN D 25 HYDROXY (VIT D DEFICIENCY, FRACTURES): Vit D, 25-Hydroxy: 21.3 ng/mL — ABNORMAL LOW (ref 30.0–100.0)

## 2021-07-11 ENCOUNTER — Encounter: Payer: Self-pay | Admitting: Gastroenterology

## 2021-07-11 ENCOUNTER — Ambulatory Visit (INDEPENDENT_AMBULATORY_CARE_PROVIDER_SITE_OTHER): Payer: BC Managed Care – PPO | Admitting: Gastroenterology

## 2021-07-11 VITALS — BP 116/77 | HR 82 | Temp 97.5°F | Ht 65.0 in | Wt 186.8 lb

## 2021-07-11 DIAGNOSIS — K529 Noninfective gastroenteritis and colitis, unspecified: Secondary | ICD-10-CM | POA: Diagnosis not present

## 2021-07-11 DIAGNOSIS — G8929 Other chronic pain: Secondary | ICD-10-CM | POA: Diagnosis not present

## 2021-07-11 DIAGNOSIS — R1013 Epigastric pain: Secondary | ICD-10-CM

## 2021-07-11 DIAGNOSIS — K9089 Other intestinal malabsorption: Secondary | ICD-10-CM | POA: Diagnosis not present

## 2021-07-11 MED ORDER — OMEPRAZOLE 40 MG PO CPDR: 40.0000 mg | DELAYED_RELEASE_CAPSULE | Freq: Every day | ORAL | 0 refills | Status: DC

## 2021-07-11 MED ORDER — CHOLESTYRAMINE LIGHT 4 G PO PACK: 4.0000 g | PACK | Freq: Two times a day (BID) | ORAL | 2 refills | Status: DC

## 2021-07-11 NOTE — Progress Notes (Signed)
Wyline Mood MD, MRCP(U.K) [address]  [address]  [place], [place] [address]  Main: [phone]  Fax: [phone]   Gastroenterology Consultation  Referring Provider:     Glenetta Borg, CNM Primary Care Physician:  [name], NP Primary Gastroenterologist:  Dr. Wyline Mood  Reason for Consultation:     Diarrhea        HPI:   Diane [name] is a 36 y.o. y/o female referred for consultation & management  by  [name], NP.    She has been referred to see me for diarrhea.  She was last seen at my office in 2019 for reflux.  At that point of time she had an episode of what appeared to be biliary colic subsequently underwent a laparoscopic cholecystectomy.  This rarely has been going on since she had a cholecystectomy.  2-3 bowel movements per day.  Depends on what she eats.  Usually after her meals she has watery diarrhea.  Stress makes it wors\e.  Her father passed away 1 month back.  Since then she has had epigastric discomfort.  Denies any NSAID use.  Pain feels like her biliary colic she has had in the past.  Not on any PPI.  No weight loss.  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 Authorizing Provider  Accu-Chek Softclix Lancets lancets 1 each by Other  route 4 (four) times daily. Patient not taking: Reported on 07/05/2021 04/10/20   Gunnar Bulla, CNM  albuterol [facility] HFA) [phone] Base) MCG/ACT inhaler Inhale 2 puffs into the lungs every 6 (six) hours as needed for wheezing or shortness of breath. Patient not taking: Reported on 07/05/2021 11/27/20   [name], NP  glucose blood (ACCU-CHEK SMARTVIEW) test strip Use as instructed to check blood sugars Patient not taking: Reported on 07/05/2021 04/10/20   Gunnar Bulla, CNM  levothyroxine (SYNTHROID) 50 MCG tablet TAKE 1 TABLET(50 MCG) BY MOUTH DAILY BEFORE BREAKFAST 12/16/20   Romero Belling, MD  norgestimate-ethinyl estradiol (ESTARYLLA) 0.25-35 MG-MCG tablet Take 1 tablet by mouth daily. 06/14/21   Doreene Burke, CNM  Prenatal Vit-Fe Fumarate-FA (PRENATAL MULTIVITAMIN) TABS tablet Take 1 tablet by mouth daily.  Patient not taking: Reported on 07/05/2021    [provider]    Family History  Problem Relation Age of Onset   Diabetes Father    Asthma Father    Hypertension Father    Heart disease Maternal Grandmother    Colon cancer Maternal Grandmother    Breast cancer Maternal Grandmother    Heart disease Paternal Grandfather    Pancreatic cancer Paternal Grandmother    Other Brother    Thyroid disease Neg Hx  Social History   Tobacco Use   Smoking status: Never   Smokeless tobacco: Never  Vaping Use   Vaping Use: Never used  Substance Use Topics   Alcohol use: Yes    Comment: occasional   Drug use: No    Allergies as of 07/11/2021   (No Known Allergies)    Review of Systems:    All systems reviewed and negative except where noted in HPI.   Physical Exam:  LMP 06/18/2021 (Approximate)  Patient's last menstrual period was 06/18/2021 (approximate). Psych:  Alert and cooperative. Normal mood and affect. General:   Alert,  Well-developed, well-nourished, pleasant and cooperative in NAD Head:  Normocephalic and  atraumatic. Eyes:  Sclera clear, no icterus.   Conjunctiva pink. Ears:  Normal auditory acuity. Lungs:  Respirations even and unlabored.  Clear throughout to auscultation.   No wheezes, crackles, or rhonchi. No acute distress. Heart:  Regular rate and rhythm; no murmurs, clicks, rubs, or gallops. Abdomen:  Normal bowel sounds.  No bruits.  Soft, non-tender and non-distended without masses, hepatosplenomegaly or hernias noted.  No guarding or rebound tenderness.    Neurologic:  Alert and oriented x3;  grossly normal neurologically. Psych:  Alert and cooperative. Normal mood and affect.  Imaging Studies: No results found.  Assessment and Plan:   Diane [name] is a 36 y.o. y/o female has been referred for here today to see me for chronic diarrhea.  Status post laparoscopic cholecystectomy in 2019.  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  No other red flag signs.  Also has some epigastric discomfort recent stressful events in her family.  Likely related to dyspepsia. plan 1.  H. pylori breath test 2.  Celiac serology, stool tests. 3.  Trial of Questran 1 packet twice daily. 4.  Prilosec 40 mg once a day. 5.  Low FODMAP diet 6.  If above is negative and no resolution of symptoms in 6 weeks time we will plan for EGD and colonoscopy.  Although low on the differential possible stone in the common bile duct will need to be entertained if the pain persists  Follow up in 6 weeks  Dr Wyline Mood MD,MRCP(U.K)

## 2021-07-11 NOTE — Patient Instructions (Signed)
Low-FODMAP Eating Plan °FODMAP stands for fermentable oligosaccharides, disaccharides, monosaccharides, and polyols. These are sugars that are hard for some people to digest. A low-FODMAP eating plan may help some people who have irritable bowel syndrome (IBS) and certain other bowel (intestinal) diseases to manage their symptoms. °This meal plan can be complicated to follow. Work with a diet and nutrition specialist (dietitian) to make a low-FODMAP eating plan that is right for you. A dietitian can help make sure that you get enough nutrition from this diet. °What are tips for following this plan? °Reading food labels °Check labels for hidden FODMAPs such as: °High-fructose syrup. °Honey. °Agave. °Natural fruit flavors. °Onion or garlic powder. °Choose low-FODMAP foods that contain 3-4 grams of fiber per serving. °Check food labels for serving sizes. Eat only one serving at a time to make sure FODMAP levels stay low. °Shopping °Shop with a list of foods that are recommended on this diet and make a meal plan. °Meal planning °Follow a low-FODMAP eating plan for up to 6 weeks, or as told by your health care provider or dietitian. °To follow the eating plan: °Eliminate high-FODMAP foods from your diet completely. Choose only low-FODMAP foods to eat. You will do this for 2-6 weeks. °Gradually reintroduce high-FODMAP foods into your diet one at a time. Most people should wait a few days before introducing the next new high-FODMAP food into their meal plan. Your dietitian can recommend how quickly you may reintroduce foods. °Keep a daily record of what and how much you eat and drink. Make note of any symptoms that you have after eating. °Review your daily record with a dietitian regularly to identify which foods you can eat and which foods you should avoid. °General tips °Drink enough fluid each day to keep your urine pale yellow. °Avoid processed foods. These often have added sugar and may be high in FODMAPs. °Avoid most  dairy products, whole grains, and sweeteners. °Work with a dietitian to make sure you get enough fiber in your diet. °Avoid high FODMAP foods at meals to manage symptoms. °Recommended foods °Fruits °Bananas, oranges, tangerines, lemons, limes, blueberries, raspberries, strawberries, grapes, cantaloupe, honeydew melon, kiwi, papaya, passion fruit, and pineapple. Limited amounts of dried cranberries, banana chips, and shredded coconut. °Vegetables °Eggplant, zucchini, cucumber, peppers, green beans, bean sprouts, lettuce, arugula, kale, Swiss chard, spinach, collard greens, bok choy, summer squash, potato, and tomato. Limited amounts of corn, carrot, and sweet potato. Green parts of scallions. °Grains °Gluten-free grains, such as rice, oats, buckwheat, quinoa, corn, polenta, and millet. Gluten-free pasta, bread, or cereal. Rice noodles. Corn tortillas. °Meats and other proteins °Unseasoned beef, pork, poultry, or fish. Eggs. Bacon. Tofu (firm) and tempeh. Limited amounts of nuts and seeds, such as almonds, walnuts, brazil nuts, pecans, peanuts, nut butters, pumpkin seeds, chia seeds, and sunflower seeds. °Dairy °Lactose-free milk, yogurt, and kefir. Lactose-free cottage cheese and ice cream. Non-dairy milks, such as almond, coconut, hemp, and rice milk. Non-dairy yogurt. Limited amounts of goat cheese, brie, mozzarella, parmesan, swiss, and other hard cheeses. °Fats and oils °Butter-free spreads. Vegetable oils, such as olive, canola, and sunflower oil. °Seasoning and other foods °Artificial sweeteners with names that do not end in "ol," such as aspartame, saccharine, and stevia. Maple syrup, white table sugar, raw sugar, brown sugar, and molasses. Mayonnaise, soy sauce, and tamari. Fresh basil, coriander, parsley, rosemary, and thyme. °Beverages °Water and mineral water. Sugar-sweetened soft drinks. Small amounts of orange juice or cranberry juice. Black and green tea. Most dry wines. Coffee. °  The items listed above  may not be a complete list of foods and beverages you can eat. Contact a dietitian for more information. °Foods to avoid °Fruits °Fresh, dried, and juiced forms of apple, pear, watermelon, peach, plum, cherries, apricots, blackberries, boysenberries, figs, nectarines, and mango. Avocado. °Vegetables °Chicory root, artichoke, asparagus, cabbage, snow peas, Brussels sprouts, broccoli, sugar snap peas, mushrooms, celery, and cauliflower. Onions, garlic, leeks, and the white part of scallions. °Grains °Wheat, including kamut, durum, and semolina. Barley and bulgur. Couscous. Wheat-based cereals. Wheat noodles, bread, crackers, and pastries. °Meats and other proteins °Fried or fatty meat. Sausage. Cashews and pistachios. Soybeans, baked beans, black beans, chickpeas, kidney beans, fava beans, navy beans, lentils, black-eyed peas, and split peas. °Dairy °Milk, yogurt, ice cream, and soft cheese. Cream and sour cream. Milk-based sauces. Custard. Buttermilk. Soy milk. °Seasoning and other foods °Any sugar-free gum or candy. Foods that contain artificial sweeteners such as sorbitol, mannitol, isomalt, or xylitol. Foods that contain honey, high-fructose corn syrup, or agave. Bouillon, vegetable stock, beef stock, and chicken stock. Garlic and onion powder. Condiments made with onion, such as hummus, chutney, pickles, relish, salad dressing, and salsa. Tomato paste. °Beverages °Chicory-based drinks. Coffee substitutes. Chamomile tea. Fennel tea. Sweet or fortified wines such as port or sherry. Diet soft drinks made with isomalt, mannitol, maltitol, sorbitol, or xylitol. Apple, pear, and mango juice. Juices with high-fructose corn syrup. °The items listed above may not be a complete list of foods and beverages you should avoid. Contact a dietitian for more information. °Summary °FODMAP stands for fermentable oligosaccharides, disaccharides, monosaccharides, and polyols. These are sugars that are hard for some people to  digest. °A low-FODMAP eating plan is a short-term diet that helps to ease symptoms of certain bowel diseases. °The eating plan usually lasts up to 6 weeks. After that, high-FODMAP foods are reintroduced gradually and one at a time. This can help you find out which foods may be causing symptoms. °A low-FODMAP eating plan can be complicated. It is best to work with a dietitian who has experience with this type of plan. °This information is not intended to replace advice given to you by your health care provider. Make sure you discuss any questions you have with your health care provider. °Document Revised: 10/28/2019 Document Reviewed: 10/28/2019 °Elsevier Patient Education © 2022 [employer] ° °

## 2021-07-12 LAB — H. PYLORI BREATH TEST: H pylori Breath Test: NEGATIVE

## 2021-07-13 LAB — CELIAC DISEASE AB SCREEN W/RFX
Antigliadin Abs, IgA: 3 units (ref 0–19)
IgA/Immunoglobulin A, Serum: 205 mg/dL (ref 87–352)
Transglutaminase IgA: <2 U/mL (ref 0–3)

## 2021-07-16 DIAGNOSIS — K529 Noninfective gastroenteritis and colitis, unspecified: Secondary | ICD-10-CM | POA: Diagnosis not present

## 2021-07-16 DIAGNOSIS — K9089 Other intestinal malabsorption: Secondary | ICD-10-CM | POA: Diagnosis not present

## 2021-07-16 DIAGNOSIS — R1013 Epigastric pain: Secondary | ICD-10-CM | POA: Diagnosis not present

## 2021-07-16 DIAGNOSIS — G8929 Other chronic pain: Secondary | ICD-10-CM | POA: Diagnosis not present

## 2021-07-16 LAB — CYTOLOGY - PAP
Comment: NEGATIVE
Diagnosis: NEGATIVE
High risk HPV: NEGATIVE

## 2021-07-17 ENCOUNTER — Encounter: Payer: Self-pay | Admitting: Obstetrics

## 2021-07-18 ENCOUNTER — Other Ambulatory Visit: Payer: Self-pay | Admitting: Obstetrics

## 2021-07-18 DIAGNOSIS — Z131 Encounter for screening for diabetes mellitus: Secondary | ICD-10-CM

## 2021-07-18 LAB — GI PROFILE, STOOL, PCR

## 2021-07-18 LAB — CALPROTECTIN, FECAL: Calprotectin, Fecal: <16 ug/g (ref 0–120)

## 2021-07-20 LAB — C DIFFICILE, CYTOTOXIN B

## 2021-07-20 LAB — C DIFFICILE TOXINS A+B W/RFLX: C difficile Toxins A+B, EIA: NEGATIVE

## 2021-07-25 ENCOUNTER — Other Ambulatory Visit: Payer: Self-pay

## 2021-07-25 ENCOUNTER — Ambulatory Visit: Payer: BC Managed Care – PPO | Admitting: Endocrinology

## 2021-07-25 VITALS — BP 108/74 | HR 93 | Ht 65.0 in | Wt 186.2 lb

## 2021-07-25 DIAGNOSIS — E039 Hypothyroidism, unspecified: Secondary | ICD-10-CM

## 2021-07-25 LAB — TSH: TSH: 0.74 u[IU]/mL (ref 0.35–5.50)

## 2021-07-25 LAB — T4, FREE: Free T4: 0.82 ng/dL (ref 0.60–1.60)

## 2021-07-25 NOTE — Patient Instructions (Addendum)
Blood tests are requested for you today.  We'll let you know about the results.  It is best to never miss the medication.  However, if you do miss it, next best is to double up the next time. Please come back for a follow-up appointment in 9 months.   

## 2021-07-25 NOTE — Progress Notes (Signed)
Subjective:    Patient ID: Diane [name], female    DOB: 02-May-1986, 36 y.o.   MRN: [id number]  HPI Pt returns for f/u of subacute thyroiditis (dx'ed mid-2016, when she presented with hyperthyroidism; she has never had thyroid imaging; she had births in 2016 and 2018; she was rx'ed synthroid in late 2016, due to the spontaneous evolution of hypothyroidism).  She is not at risk for pregnancy now, but may reconsider in the future.  Main symptoms are hair loss and fatigue.  She takes synthroid as rx'ed.   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    Past Surgical History:  Procedure Laterality Date   CHOLECYSTECTOMY N/A 11/28/2017   Procedure: LAPAROSCOPIC CHOLECYSTECTOMY WITH INTRAOPERATIVE CHOLANGIOGRAM;  Surgeon: Luretha Murphy, MD;  Location: [facility] ORS;  Service: General;  Laterality: N/A;   LAPAROSCOPY  2009   nsvd  2018,2016   x 2    Social History   Socioeconomic History   Marital status: Married    Spouse name: Not on file   Number of children: Not on file   Years of education: Not on file   Highest education level: Not on file  Occupational History   Not on file  Tobacco Use   Smoking status: Never   Smokeless tobacco: Never  Vaping Use   Vaping Use: Never used  Substance and Sexual Activity   Alcohol use: Yes    Comment: occasional   Drug use: No   Sexual activity: Yes    Birth control/protection: Pill  Other Topics Concern   Not on file  Social History Narrative   Married.   2 children.   Masters in Programme researcher, broadcasting/film/video.   Enjoys reading, photography, crafting.    Social Determinants of Health   Financial Resource Strain: Not on file  Food Insecurity: Not on file  Transportation  Needs: Not on file  Physical Activity: Not on file  Stress: Not on file  Social Connections: Not on file  Intimate Partner Violence: Not on file    Current Outpatient Medications on File Prior to Visit  Medication Sig Dispense Refill   Accu-Chek Softclix Lancets lancets 1 each by Other route 4 (four) times daily. 100 each 12   albuterol (PROAIR HFA) 108 (90 Base) MCG/ACT inhaler Inhale 2 puffs into the lungs every 6 (six) hours as needed for wheezing or shortness of breath. 1 each 0   cholestyramine light (PREVALITE) 4 g packet Take 1 packet (4 g total) by mouth 2 (two) times daily. 60 packet 2   glucose blood (ACCU-CHEK SMARTVIEW) test strip Use as instructed to check blood sugars 100 each 12   levothyroxine (SYNTHROID) 50 MCG tablet TAKE 1 TABLET(50 MCG) BY MOUTH DAILY BEFORE BREAKFAST 90 tablet 3   norgestimate-ethinyl estradiol (ESTARYLLA) 0.25-35 MG-MCG tablet Take 1 tablet by mouth daily. 84 tablet 0   omeprazole (PRILOSEC) 40 MG capsule Take 1 capsule (40 mg total) by mouth daily. 60 capsule 0   No current facility-administered medications on file prior to visit.    No Known Allergies  Family History  Problem Relation Age of Onset   Diabetes Father    Asthma Father  Hypertension Father    Heart disease Maternal Grandmother    Colon cancer Maternal Grandmother    Breast cancer Maternal Grandmother    Heart disease Paternal Grandfather    Pancreatic cancer Paternal Grandmother    Other Brother    Thyroid disease Neg Hx     BP 108/74    Pulse 93    Ht 5\' 5"  (1.651 m)    Wt 186 lb 3.2 oz (84.5 kg)    SpO2 99%    BMI 30.99 kg/m    Review of Systems     Objective:   Physical Exam VITAL SIGNS:  See vs page GENERAL: no distress NECK: There is no palpable thyroid enlargement.  No thyroid nodule is palpable.  No palpable lymphadenopathy at the anterior neck.     Lab Results  Component Value Date   TSH 0.74 07/25/2021   T4TOTAL 4.0 (L) 02/02/2018      Assessment  & Plan:  Hypothyroidism.  Please continue the same synthroid.  Please continue the same synthroid.

## 2021-08-21 ENCOUNTER — Other Ambulatory Visit: Payer: Self-pay | Admitting: Nurse Practitioner

## 2021-08-21 ENCOUNTER — Other Ambulatory Visit: Payer: Self-pay

## 2021-08-21 ENCOUNTER — Ambulatory Visit: Payer: BC Managed Care – PPO | Admitting: Nurse Practitioner

## 2021-08-21 ENCOUNTER — Encounter: Payer: Self-pay | Admitting: Nurse Practitioner

## 2021-08-21 VITALS — BP 118/72 | HR 79 | Temp 96.7°F | Resp 12 | Ht 65.0 in | Wt 190.5 lb

## 2021-08-21 DIAGNOSIS — J0140 Acute pansinusitis, unspecified: Secondary | ICD-10-CM | POA: Diagnosis not present

## 2021-08-21 DIAGNOSIS — R051 Acute cough: Secondary | ICD-10-CM | POA: Diagnosis not present

## 2021-08-21 DIAGNOSIS — R0982 Postnasal drip: Secondary | ICD-10-CM | POA: Diagnosis not present

## 2021-08-21 MED ORDER — FLUTICASONE PROPIONATE 50 MCG/ACT NA SUSP: 2.0000 | Freq: Every day | NASAL | 0 refills | Status: DC

## 2021-08-21 MED ORDER — AMOXICILLIN-POT CLAVULANATE 875-125 MG PO TABS: 1.0000 | ORAL_TABLET | Freq: Two times a day (BID) | ORAL | 0 refills | Status: AC

## 2021-08-21 MED ORDER — BENZONATATE 200 MG PO CAPS: 200.0000 mg | ORAL_CAPSULE | Freq: Three times a day (TID) | ORAL | 0 refills | Status: AC | PRN

## 2021-08-21 MED ORDER — BENZONATATE 200 MG PO CAPS: 200.0000 mg | ORAL_CAPSULE | Freq: Three times a day (TID) | ORAL | 0 refills | Status: DC | PRN

## 2021-08-21 NOTE — Assessment & Plan Note (Signed)
Likely secondary to the PND. Will write some tessalon perales 200mg  TID PRN cough. Did offer to give patient a cough medication to help her rest at night and she declined as she has younger kids at home.

## 2021-08-21 NOTE — Assessment & Plan Note (Signed)
Patient had cobblestoning on exam. Will start Flonase 2 sprays each nostril daily. Did review precautions in regards to epistaxis

## 2021-08-21 NOTE — Assessment & Plan Note (Signed)
Given length of symptoms and presentation elect to treat with Augmentin 875-125 BID for 7 days. RTC precautions discussed

## 2021-08-21 NOTE — Addendum Note (Signed)
Addended by: Consuella Lose on: 08/21/2021 09:26 AM   Modules accepted: Orders

## 2021-08-21 NOTE — Patient Instructions (Addendum)
Nice to see you today I sent in 3 medications to your pharmacy Follow up if your symptoms fail to improve or get worse.

## 2021-08-21 NOTE — Progress Notes (Signed)
Acute Office Visit  Subjective:    Patient ID: Diane [name], female    DOB: [birth year], 36 y.o.   MRN: [id number]  Chief Complaint  Patient presents with   Cough    Sx started about 2 weeks ago-post nasal drip, congestion, runny nose, headache, coughing up greenish phlegm at times, stuffy nose. No fever. Has been taking Mucinex, Delsym. Covid test negative at home about 1 week ago    Cough Associated symptoms include ear pain (left ear pain intermittient), postnasal drip and a sore throat (sore and scrathcy). Pertinent negatives include no chest pain, chills, fever, headaches, myalgias or shortness of breath.  Patient is in today for cough  Symptoms started approx 2 weeks ago [employer] x2 and one booster States she had covid septm into [place] States that her daughter has been bring home illness all season. 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    Past Surgical History:  Procedure Laterality Date   CHOLECYSTECTOMY N/A 11/28/2017   Procedure: LAPAROSCOPIC CHOLECYSTECTOMY WITH INTRAOPERATIVE CHOLANGIOGRAM;  Surgeon: Luretha Murphy, MD;  Location: [facility] ORS;  Service: General;  Laterality: N/A;   LAPAROSCOPY  2009   nsvd  2018,2016   x 2    Family History  Problem Relation Age of Onset   Diabetes Father    Asthma Father    Hypertension Father    Heart disease Maternal Grandmother    Colon cancer Maternal Grandmother    Breast cancer Maternal Grandmother    Heart disease Paternal Grandfather    Pancreatic cancer Paternal Grandmother    Other Brother     Thyroid disease Neg Hx     Social History   Socioeconomic History   Marital status: Married    Spouse name: Not on file   Number of children: Not on file   Years of education: Not on file   Highest education level: Not on file  Occupational History   Not on file  Tobacco Use   Smoking status: Never   Smokeless tobacco: Never  Vaping Use   Vaping Use: Never used  Substance and Sexual Activity   Alcohol use: Yes    Comment: occasional   Drug use: No   Sexual activity: Yes    Birth control/protection: Pill  Other Topics Concern   Not on file  Social History Narrative   Married.   2 children.   Masters in Programme researcher, broadcasting/film/video.   Enjoys reading, photography, crafting.    Social Determinants of Health   Financial Resource Strain: Not on file  Food Insecurity: Not on file  Transportation Needs: Not on file  Physical Activity: Not on file  Stress: Not on file  Social Connections: Not on file  Intimate Partner Violence: Not on file    Outpatient Medications Prior to Visit  Medication Sig Dispense Refill   Accu-Chek Softclix Lancets lancets 1 each by Other route 4 (four) times daily. 100 each 12   albuterol (PROAIR HFA) 108 (90 Base) MCG/ACT inhaler Inhale 2 puffs into the lungs  every 6 (six) hours as needed for wheezing or shortness of breath. 1 each 0   glucose blood (ACCU-CHEK SMARTVIEW) test strip Use as instructed to check blood sugars 100 each 12   levothyroxine (SYNTHROID) 50 MCG tablet TAKE 1 TABLET(50 MCG) BY MOUTH DAILY BEFORE BREAKFAST 90 tablet 3   norgestimate-ethinyl estradiol (ESTARYLLA) 0.25-35 MG-MCG tablet Take 1 tablet by mouth daily. 84 tablet 0   omeprazole (PRILOSEC) 40 MG capsule Take 1 capsule (40 mg total) by mouth daily. (Patient not taking: Reported on 08/21/2021) 60 capsule 0   cholestyramine light (PREVALITE) 4 g packet Take 1 packet (4 g total) by mouth 2 (two) times daily. 60 packet 2   No facility-administered medications prior to visit.    No Known  Allergies  Review of Systems  Constitutional:  Positive for fatigue. Negative for appetite change, chills and fever.  HENT:  Positive for congestion, ear pain (left ear pain intermittient), postnasal drip, sinus pressure, sinus pain and sore throat (sore and scrathcy).   Respiratory:  Positive for cough (green and thick). Negative for shortness of breath.   Cardiovascular:  Negative for chest pain.  Gastrointestinal:  Negative for abdominal pain, diarrhea, nausea and vomiting.  Musculoskeletal:  Negative for arthralgias and myalgias.  Neurological:  Negative for dizziness, light-headedness and headaches.      Objective:    Physical Exam Vitals and nursing note reviewed.  Constitutional:      Appearance: Normal appearance.  HENT:     Right Ear: Tympanic membrane, ear canal and external ear normal.     Left Ear: Tympanic membrane, ear canal and external ear normal.     Nose:     Right Sinus: Maxillary sinus tenderness and frontal sinus tenderness present.     Left Sinus: Maxillary sinus tenderness and frontal sinus tenderness present.     Mouth/Throat:     Mouth: Mucous membranes are moist.     Pharynx: No posterior oropharyngeal erythema.     Comments: cobblestoning Cardiovascular:     Rate and Rhythm: Normal rate and regular rhythm.     Heart sounds: Normal heart sounds.  Pulmonary:     Effort: Pulmonary effort is normal.     Breath sounds: Normal breath sounds.  Abdominal:     General: Bowel sounds are normal. There is no distension.     Palpations: There is no mass.     Tenderness: There is no abdominal tenderness.  Lymphadenopathy:     Cervical: Cervical adenopathy present.  Neurological:     Mental Status: She is alert.    BP 118/72    Pulse 79    Temp (!) 96.7 F (35.9 C)    Resp 12    Ht 5\' 5"  (1.651 m)    Wt 190 lb 8 oz (86.4 kg)    LMP 08/14/2021    SpO2 99%    BMI 31.70 kg/m  Wt Readings from Last 3 Encounters:  08/21/21 190 lb 8 oz (86.4 kg)  07/25/21 186 lb  3.2 oz (84.5 kg)  07/11/21 186 lb 12.8 oz (84.7 kg)    Health Maintenance Due  Topic Date Due   COVID-19 Vaccine (4 - Booster for Pfizer series) 08/15/2020    There are no preventive care reminders to display for this patient.   Lab Results  Component Value Date   TSH 0.74 07/25/2021   Lab Results  Component Value Date   WBC 4.7 07/06/2021   HGB 11.9 07/06/2021   HCT 35.9 07/06/2021  MCV 82 07/06/2021   PLT 283 07/06/2021   Lab Results  Component Value Date   NA 138 09/05/2020   K 4.0 09/05/2020   CO2 29 09/05/2020   GLUCOSE 94 09/05/2020   BUN 9 09/05/2020   CREATININE 0.73 09/05/2020   BILITOT 0.3 09/05/2020   ALKPHOS 54 09/05/2020   AST 8 09/05/2020   ALT 8 09/05/2020   PROT 6.9 09/05/2020   ALBUMIN 4.0 09/05/2020   CALCIUM 9.1 09/05/2020   GFR 106.58 09/05/2020   Lab Results  Component Value Date   CHOL 204 (H) 09/05/2020   Lab Results  Component Value Date   HDL 62.80 09/05/2020   Lab Results  Component Value Date   LDLCALC 117 (H) 09/05/2020   Lab Results  Component Value Date   TRIG 118.0 09/05/2020   Lab Results  Component Value Date   CHOLHDL 3 09/05/2020   Lab Results  Component Value Date   HGBA1C 5.7 (H) 07/06/2021       Assessment & Plan:   Problem List Items Addressed This Visit       Respiratory   Sinusitis - Primary    Given length of symptoms and presentation elect to treat with Augmentin 875-125 BID for 7 days. RTC precautions discussed      Relevant Medications   amoxicillin-clavulanate (AUGMENTIN) 875-125 MG tablet   fluticasone (FLONASE) 50 MCG/ACT nasal spray   benzonatate (TESSALON) 200 MG capsule     Other   Acute cough    Likely secondary to the PND. Will write some tessalon perales 200mg  TID PRN cough. Did offer to give patient a cough medication to help her rest at night and she declined as she has younger kids at home.      Relevant Medications   benzonatate (TESSALON) 200 MG capsule   PND  (post-nasal drip)    Patient had cobblestoning on exam. Will start Flonase 2 sprays each nostril daily. Did review precautions in regards to epistaxis        Meds ordered this encounter  Medications   amoxicillin-clavulanate (AUGMENTIN) 875-125 MG tablet    Sig: Take 1 tablet by mouth 2 (two) times daily for 7 days.    Dispense:  14 tablet    Refill:  0    Order Specific Question:   Supervising Provider    Answer:   MARNE A [[id number]]   fluticasone (FLONASE) 50 MCG/ACT nasal spray    Sig: Place 2 sprays into both nostrils daily.    Dispense:  16 g    Refill:  0    Order Specific Question:   Supervising Provider    Answer:   Milinda Antis MARNE A [[id number]]   benzonatate (TESSALON) 200 MG capsule    Sig: Take 1 capsule (200 mg total) by mouth 3 (three) times daily as needed for up to 10 days for cough.    Dispense:  30 capsule    Refill:  0    Order Specific Question:   Supervising Provider    Answer:   Milinda Antis A [[id number]]   This visit occurred during the SARS-CoV-2 public health emergency.  Safety protocols were in place, including screening questions prior to the visit, additional usage of staff PPE, and extensive cleaning of exam room while observing appropriate contact time as indicated for disinfecting solutions.    Roxy Manns, NP

## 2021-09-07 ENCOUNTER — Other Ambulatory Visit: Payer: Self-pay | Admitting: Certified Nurse Midwife

## 2021-09-12 ENCOUNTER — Other Ambulatory Visit: Payer: Self-pay | Admitting: Certified Nurse Midwife

## 2021-09-13 ENCOUNTER — Ambulatory Visit: Payer: BC Managed Care – PPO | Admitting: Gastroenterology

## 2021-09-15 ENCOUNTER — Encounter: Payer: Self-pay | Admitting: Certified Nurse Midwife

## 2021-09-17 ENCOUNTER — Other Ambulatory Visit: Payer: Self-pay

## 2021-09-17 MED ORDER — NORGESTIMATE-ETH ESTRADIOL 0.25-35 MG-MCG PO TABS: 1.0000 | ORAL_TABLET | Freq: Every day | ORAL | 0 refills | Status: DC

## 2021-09-19 ENCOUNTER — Encounter: Payer: Self-pay | Admitting: Obstetrics

## 2021-09-19 ENCOUNTER — Other Ambulatory Visit: Payer: Self-pay | Admitting: Obstetrics

## 2021-09-19 MED ORDER — NORGESTIMATE-ETH ESTRADIOL 0.25-35 MG-MCG PO TABS: 1.0000 | ORAL_TABLET | Freq: Every day | ORAL | 3 refills | Status: DC

## 2021-09-20 ENCOUNTER — Other Ambulatory Visit: Payer: Self-pay | Admitting: Gastroenterology

## 2021-10-22 NOTE — Telephone Encounter (Signed)
Spoke to patient by telephone and was advised that she started with mild back pain last night. Patient stated that she has had some diarrhea. Patient denies a fever, nausea, urine urgency/frequency or burning with urination. Patient stated that she does not have any pain when she is not moving around. Patient stated the most her pain level has been was a #5. Patient scheduled an appointment with Mort Sawyers  NP tomorrow 10/23/21 at 11:00. Patient was given ER precautions and she verbalized understanding. ?

## 2021-10-23 ENCOUNTER — Encounter: Payer: Self-pay | Admitting: Family

## 2021-10-23 ENCOUNTER — Ambulatory Visit: Payer: BC Managed Care – PPO | Admitting: Family

## 2021-10-23 VITALS — BP 114/70 | HR 75 | Temp 98.3°F | Resp 16 | Ht 65.0 in | Wt 191.5 lb

## 2021-10-23 DIAGNOSIS — R1031 Right lower quadrant pain: Secondary | ICD-10-CM | POA: Diagnosis not present

## 2021-10-23 LAB — POC URINALSYSI DIPSTICK (AUTOMATED)
Bilirubin, UA: NEGATIVE
Blood, UA: POSITIVE
Clarity, UA: NEGATIVE
Glucose, UA: NEGATIVE
Ketones, UA: NEGATIVE
Leukocytes, UA: NEGATIVE
Nitrite, UA: NEGATIVE
Protein, UA: POSITIVE — AB
Spec Grav, UA: >=1.03 — AB (ref 1.010–1.025)
Urobilinogen, UA: 0.2 E.U./dL
pH, UA: 5.5 (ref 5.0–8.0)

## 2021-10-23 LAB — CBC WITH DIFFERENTIAL/PLATELET
Basophils Absolute: 0 10*3/uL (ref 0.0–0.1)
Basophils Relative: 0.5 % (ref 0.0–3.0)
Eosinophils Absolute: 0.1 10*3/uL (ref 0.0–0.7)
Eosinophils Relative: 1.5 % (ref 0.0–5.0)
HCT: 36 % (ref 36.0–46.0)
Hemoglobin: 11.8 g/dL — ABNORMAL LOW (ref 12.0–15.0)
Lymphocytes Relative: 33.7 % (ref 12.0–46.0)
Lymphs Abs: 2 10*3/uL (ref 0.7–4.0)
MCHC: 32.7 g/dL (ref 30.0–36.0)
MCV: 83.4 fl (ref 78.0–100.0)
Monocytes Absolute: 0.6 10*3/uL (ref 0.1–1.0)
Monocytes Relative: 10.9 % (ref 3.0–12.0)
Neutro Abs: 3.1 10*3/uL (ref 1.4–7.7)
Neutrophils Relative %: 53.4 % (ref 43.0–77.0)
Platelets: 320 10*3/uL (ref 150.0–400.0)
RBC: 4.32 Mil/uL (ref 3.87–5.11)
RDW: 13.5 % (ref 11.5–15.5)
WBC: 5.9 10*3/uL (ref 4.0–10.5)

## 2021-10-23 NOTE — Patient Instructions (Signed)
Stop by the lab prior to leaving today. I will notify you of your results once received.   Due to recent changes in healthcare laws, you may see results of your imaging and/or laboratory studies on MyChart before I have had a chance to review them.  I understand that in some cases there may be results that are confusing or concerning to you. Please understand that not all results are received at the same time and often I may need to interpret multiple results in order to provide you with the best plan of care or course of treatment. Therefore, I ask that you please give me 2 business days to thoroughly review all your results before contacting my office for clarification. Should we see a critical lab result, you will be contacted sooner.   It was a pleasure seeing you today! Please do not hesitate to reach out with any questions and or concerns.  Regards,   Josemanuel Eakins FNP-C  

## 2021-10-23 NOTE — Assessment & Plan Note (Addendum)
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

## 2021-10-23 NOTE — Progress Notes (Signed)
? ?Established Patient Office Visit ? ?Subjective:  ?Patient ID: Diane [name], female    DOB: 1986-06-02  Age: 36 y.o. MRN: [id number] ? ?CC:  ?Chief Complaint  ?Patient presents with  ? Flank Pain  ?  Lower right hurt more if she would move around. Sharp pain and would go down the thigh. Was not worse if pt pressed on her stomach.  ? Diarrhea  ?  Yesterday   ? ? ?HPI ?Diane [name] is here today with concerns.  ? ?Yesterday spent most of the day with right lower abdominal pain, and the pain would sometimes go up the thigh when she was walking. Pain felt gas like and was stabbing sort of pain, felt gassy, bloated and was burping and flatulence with no real relief of pain. Did have slight diarrhea yesterday as well.  ?Movement aggravated it. No nausea, could push on area without pain.  ?Pain today almost completely resolved only a few twinges.  ?No nausea or vomiting.  ?No diarrhea today.  ?No fever no chills.  ? ?Also did pregnancy test yesterday and was negative.  ?And had just finished her period.  ? ?Past Medical History:  ?Diagnosis Date  ? Anxiety   ? Asthma   ? activity induced asthma  ? Endometriosis   ? Gall stones 07/30/2017  ? GERD (gastroesophageal reflux disease)   ? Gestational diabetes mellitus   ? Hypothyroidism   ? Ovarian cyst   ? Stress incontinence 04/16/2017  ? Thyroiditis   ? 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 ? ?Past Surgical History:  ?Procedure Laterality Date  ? CHOLECYSTECTOMY N/A 11/28/2017  ? Procedure: LAPAROSCOPIC CHOLECYSTECTOMY WITH INTRAOPERATIVE CHOLANGIOGRAM;  Surgeon: Luretha Murphy, MD;  Location: [facility] ORS;  Service: General;  Laterality: N/A;  ? LAPAROSCOPY  2009  ? nsvd  2018,2016  ? x 2  ? ? ?Family History  ?Problem Relation Age of Onset  ? Diabetes Father   ? Asthma Father   ? Hypertension Father   ? Heart disease Maternal Grandmother   ? Colon cancer Maternal Grandmother   ? Breast cancer Maternal Grandmother   ? Heart disease Paternal Grandfather   ? Pancreatic  cancer Paternal Grandmother   ? Other Brother   ? Thyroid disease Neg Hx   ? ? ?Social History  ? ?Socioeconomic History  ? Marital status: Married  ?  Spouse name: Not on file  ? Number of children: Not on file  ? Years of education: Not on file  ? Highest education level: Not on file  ?Occupational History  ? Not on file  ?Tobacco Use  ? Smoking status: Never  ? Smokeless tobacco: Never  ?Vaping Use  ? Vaping Use: Never used  ?Substance and Sexual Activity  ? Alcohol use: Yes  ?  Comment: occasional  ? Drug use: No  ? Sexual activity: Yes  ?  Birth control/protection: Pill  ?Other Topics Concern  ? Not on file  ?Social History Narrative  ? Married.  ? 2 children.  ? Masters in Programme researcher, broadcasting/film/video.  ? Enjoys reading, photography, crafting.   ? ?Social Determinants of Health  ? ?Financial Resource Strain: Not on file  ?Food Insecurity: Not on file  ?Transportation Needs: Not on file  ?Physical Activity: Not on file  ?Stress: Not on file  ?Social Connections: Not on file  ?Intimate Partner Violence: Not on file  ? ? ?Outpatient Medications Prior to Visit  ?Medication Sig Dispense Refill  ? Accu-Chek Softclix Lancets lancets 1 each by  Other route 4 (four) times daily. 100 each 12  ? albuterol (PROAIR HFA) 108 (90 Base) MCG/ACT inhaler Inhale 2 puffs into the lungs every 6 (six) hours as needed for wheezing or shortness of breath. 1 each 0  ? glucose blood (ACCU-CHEK SMARTVIEW) test strip Use as instructed to check blood sugars 100 each 12  ? levothyroxine (SYNTHROID) 50 MCG tablet TAKE 1 TABLET(50 MCG) BY MOUTH DAILY BEFORE BREAKFAST 90 tablet 3  ? norgestimate-ethinyl estradiol (ESTARYLLA) 0.25-35 MG-MCG tablet Take 1 tablet by mouth daily. 84 tablet 3  ? fluticasone (FLONASE) 50 MCG/ACT nasal spray Place 2 sprays into both nostrils daily. (Patient not taking: Reported on 10/23/2021) 16 g 0  ? omeprazole (PRILOSEC) 40 MG capsule Take 1 capsule (40 mg total) by mouth daily. (Patient not taking: Reported on 10/23/2021) 60 capsule 0   ? ?No facility-administered medications prior to visit.  ? ? ?No Known Allergies ? ?ROS ?Review of Systems  ?Constitutional:  Negative for chills and fever.  ?Respiratory:  Negative for cough, shortness of breath and wheezing.   ?Cardiovascular:  Negative for chest pain and palpitations.  ?Gastrointestinal:  Positive for abdominal pain (right lower aggravated by movement resolving today) and diarrhea (yesterday not today, no bowel movement yet today.). Negative for blood in stool, constipation, nausea and vomiting.  ?Genitourinary:  Negative for dysuria, flank pain, frequency, urgency, vaginal bleeding, vaginal discharge and vaginal pain.  ? ?  ?Objective:  ?  ?Physical Exam ?Constitutional:   ?   General: She is not in acute distress. ?   Appearance: Normal appearance. She is normal weight. She is not ill-appearing, toxic-appearing or diaphoretic.  ?Cardiovascular:  ?   Rate and Rhythm: Normal rate and regular rhythm.  ?Pulmonary:  ?   Effort: Pulmonary effort is normal.  ?   Breath sounds: Normal breath sounds.  ?Abdominal:  ?   General: Abdomen is flat. Bowel sounds are normal.  ?   Tenderness: There is abdominal tenderness. There is no guarding or rebound. Negative signs include Murphy's sign, psoas sign and obturator sign.  ?Neurological:  ?   General: No focal deficit present.  ?   Mental Status: She is alert and oriented to person, place, and time.  ?Psychiatric:     ?   Mood and Affect: Mood normal.     ?   Behavior: Behavior normal.     ?   Thought Content: Thought content normal.     ?   Judgment: Judgment normal.  ? ? ?BP 114/70   Pulse 75   Temp 98.3 ?F (36.8 ?C)   Resp 16   Ht 5\' 5"  (1.651 m)   Wt 191 lb 8 oz (86.9 kg)   LMP 10/17/2021 (Exact Date)   SpO2 98%   BMI 31.87 kg/m?  ?Wt Readings from Last 3 Encounters:  ?10/23/21 191 lb 8 oz (86.9 kg)  ?08/21/21 190 lb 8 oz (86.4 kg)  ?07/25/21 186 lb 3.2 oz (84.5 kg)  ? ? ? ?Health Maintenance Due  ?Topic Date Due  ? COVID-19 Vaccine (4 - Booster  for Pfizer series) 08/15/2020  ? ? ?There are no preventive care reminders to display for this patient. ? ?Lab Results  ?Component Value Date  ? TSH 0.74 07/25/2021  ? ?Lab Results  ?Component Value Date  ? WBC 4.7 07/06/2021  ? HGB 11.9 07/06/2021  ? HCT 35.9 07/06/2021  ? MCV 82 07/06/2021  ? PLT 283 07/06/2021  ? ?Lab Results  ?Component Value Date  ?  NA 138 09/05/2020  ? K 4.0 09/05/2020  ? CO2 29 09/05/2020  ? GLUCOSE 94 09/05/2020  ? BUN 9 09/05/2020  ? CREATININE 0.73 09/05/2020  ? BILITOT 0.3 09/05/2020  ? ALKPHOS 54 09/05/2020  ? AST 8 09/05/2020  ? ALT 8 09/05/2020  ? PROT 6.9 09/05/2020  ? ALBUMIN 4.0 09/05/2020  ? CALCIUM 9.1 09/05/2020  ? GFR 106.58 09/05/2020  ? ?Lab Results  ?Component Value Date  ? HGBA1C 5.7 (H) 07/06/2021  ? ? ?  ?Assessment & Plan:  ? ?Problem List Items Addressed This Visit   ? ?  ? Other  ? Right lower quadrant abdominal pain - Primary  ?  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 ?  ?  ? Relevant Orders  ? CBC with Differential  ? Urine Culture  ? POCT Urinalysis Dipstick (Automated) (Completed)  ? ? ?No orders of the defined types were placed in this encounter. ? ? ?Follow-up: Return if symptoms worsen or fail to improve with pcp.  ? ? ?Mort Sawyers, FNP ?

## 2021-10-24 LAB — URINE CULTURE
MICRO NUMBER:: 13340050
SPECIMEN QUALITY:: ADEQUATE

## 2021-10-25 ENCOUNTER — Other Ambulatory Visit: Payer: Self-pay | Admitting: Family

## 2021-10-25 DIAGNOSIS — R311 Benign essential microscopic hematuria: Secondary | ICD-10-CM

## 2021-10-25 NOTE — Progress Notes (Signed)
al

## 2021-10-26 ENCOUNTER — Encounter: Payer: Self-pay | Admitting: Family

## 2021-11-26 ENCOUNTER — Ambulatory Visit: Payer: BC Managed Care – PPO | Admitting: Family

## 2021-11-26 VITALS — BP 116/68 | HR 84 | Temp 98.9°F | Resp 16 | Ht 65.0 in | Wt 193.5 lb

## 2021-11-26 DIAGNOSIS — R12 Heartburn: Secondary | ICD-10-CM | POA: Insufficient documentation

## 2021-11-26 DIAGNOSIS — R110 Nausea: Secondary | ICD-10-CM | POA: Diagnosis not present

## 2021-11-26 LAB — POCT URINE PREGNANCY: Preg Test, Ur: NEGATIVE

## 2021-11-26 MED ORDER — OMEPRAZOLE 20 MG PO CPDR: 20.0000 mg | DELAYED_RELEASE_CAPSULE | Freq: Every day | ORAL | 3 refills | Status: DC

## 2021-11-26 MED ORDER — OMEPRAZOLE 20 MG PO CPDR: 20.0000 mg | DELAYED_RELEASE_CAPSULE | Freq: Every day | ORAL | 0 refills | Status: DC

## 2021-11-26 NOTE — Assessment & Plan Note (Addendum)
pregnancy test today in office , negative

## 2021-11-26 NOTE — Progress Notes (Signed)
Established Patient Office Visit  Subjective:  Patient ID: Diane [name], female    DOB: 01/25/1986  Age: 36 y.o. MRN: [id number]  CC:  Chief Complaint  Patient presents with  . Heartburn    X 1 week takes tums and help some. Started heartburn when she was hungry.    HPI Diane [name] is here today with concerns.   Has concerns with moderate heart burn.  Last week pain was comparable to a gallbladder attack as she had her gallbladder out in the past.  Had to take a lot of tums to control symptoms and the heart burn was waking her up in the middle of the night and was propped up. Ate some crackers with some relief to get back to sleep.  Has noted improvement in the last few days once she started omeprazole 20 mg once daily she started four days ago.   Doesn't eat very much fried fatty spicy foods. Not much caffeine or chocolate.  Prior to onset did have some diarrhea that day earlier.   No chest pain no sob or palpitations.  Slight heart burn, nagging but barely compared to Friday.   No chance of pregnancy, she did a pregnancy test and was negative. She is still taking daily ocps has not missed a dose.   Past Medical History:  Diagnosis Date  . Anxiety   . Asthma    activity induced asthma  . Endometriosis   . Gall stones 07/30/2017  . GERD (gastroesophageal reflux disease)   . Gestational diabetes mellitus   . Hypothyroidism   . Ovarian cyst   . Stress incontinence 04/16/2017  . Thyroiditis   . 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 CHOLECYSTECTOMY N/A 11/28/2017   Procedure: LAPAROSCOPIC CHOLECYSTECTOMY WITH INTRAOPERATIVE CHOLANGIOGRAM;  Surgeon: Luretha Murphy, MD;  Location: [facility] ORS;  Service: General;  Laterality: N/A;  . LAPAROSCOPY  2009  . nsvd  [id number]   x 2    Family History  Problem Relation Age of Onset  . Diabetes Father   . Asthma Father   . Hypertension Father   . Heart disease  Maternal Grandmother   . Colon cancer Maternal Grandmother   . Breast cancer Maternal Grandmother   . Heart disease Paternal Grandfather   . Pancreatic cancer Paternal Grandmother   . Other Brother   . Thyroid disease Neg Hx     Social History   Socioeconomic History  . Marital status: Married    Spouse name: Not on file  . Number of children: Not on file  . Years of education: Not on file  . Highest education level: Not on file  Occupational History  . Not on file  Tobacco Use  . Smoking status: Never  . Smokeless tobacco: Never  Vaping Use  . Vaping Use: Never used  Substance and Sexual Activity  . Alcohol use: Yes    Comment: occasional  . Drug use: No  . Sexual activity: Yes    Birth control/protection: Pill  Other Topics Concern  . Not on file  Social History Narrative   Married.   2 children.   Masters in Programme researcher, broadcasting/film/video.   Enjoys reading, photography, crafting.    Social Determinants of Health   Financial Resource Strain: Not on file  Food Insecurity: Not on file  Transportation Needs: Not on file  Physical Activity: Not on file  Stress: Not on file  Social Connections: Not on file  Intimate Partner Violence: Not on file    Outpatient Medications Prior to Visit  Medication Sig Dispense Refill  . Accu-Chek Softclix Lancets lancets 1 each by Other route 4 (four) times daily. 100 each 12  . albuterol (PROAIR HFA) 108 (90 Base) MCG/ACT inhaler Inhale 2 puffs into the lungs every 6 (six) hours as needed for wheezing or shortness of breath. 1 each 0  . glucose blood (ACCU-CHEK SMARTVIEW) test strip Use as instructed to check blood sugars 100 each 12  . levothyroxine (SYNTHROID) 50 MCG tablet TAKE 1 TABLET(50 MCG) BY MOUTH DAILY BEFORE BREAKFAST 90 tablet 3  . norgestimate-ethinyl estradiol (ESTARYLLA) 0.25-35 MG-MCG tablet Take 1 tablet by mouth daily. 84 tablet 3  . omeprazole (PRILOSEC) 40 MG capsule Take 40 mg by mouth daily.     No facility-administered  medications prior to visit.    No Known Allergies      Objective:    Physical Exam Constitutional:      General: She is not in acute distress.    Appearance: Normal appearance. She is normal weight. She is not ill-appearing, toxic-appearing or diaphoretic.  Cardiovascular:     Rate and Rhythm: Normal rate and regular rhythm.  Pulmonary:     Effort: Pulmonary effort is normal.     Breath sounds: Normal breath sounds.  Abdominal:     General: Abdomen is flat. Bowel sounds are normal.     Palpations: Abdomen is soft.     Tenderness: There is no abdominal tenderness.  Neurological:     General: No focal deficit present.     Mental Status: She is alert and oriented to person, place, and time. Mental status is at baseline.  Psychiatric:        Mood and Affect: Mood normal.        Behavior: Behavior normal.        Thought Content: Thought content normal.        Judgment: Judgment normal.    BP 116/68   Pulse 84   Temp 98.9 F (37.2 C)   Resp 16   Ht 5\' 5"  (1.651 m)   Wt 193 lb 8 oz (87.8 kg)   LMP 11/14/2021   SpO2 98%   BMI 32.20 kg/m  Wt Readings from Last 3 Encounters:  11/26/21 193 lb 8 oz (87.8 kg)  10/23/21 191 lb 8 oz (86.9 kg)  08/21/21 190 lb 8 oz (86.4 kg)     Health Maintenance Due  Topic Date Due  . COVID-19 Vaccine (4 - Booster for Pfizer series) 08/15/2020    There are no preventive care reminders to display for this patient.  Lab Results  Component Value Date   TSH 0.74 07/25/2021   Lab Results  Component Value Date   WBC 5.9 10/23/2021   HGB 11.8 (L) 10/23/2021   HCT 36.0 10/23/2021   MCV 83.4 10/23/2021   PLT 320.0 10/23/2021   Lab Results  Component Value Date   NA 138 09/05/2020   K 4.0 09/05/2020   CO2 29 09/05/2020   GLUCOSE 94 09/05/2020   BUN 9 09/05/2020   CREATININE 0.73 09/05/2020   BILITOT 0.3 09/05/2020   ALKPHOS 54 09/05/2020   AST 8 09/05/2020   ALT 8 09/05/2020   PROT 6.9 09/05/2020   ALBUMIN 4.0 09/05/2020    CALCIUM 9.1 09/05/2020   GFR 106.58 09/05/2020   Lab Results  Component Value Date   HGBA1C 5.7 (H) 07/06/2021      Assessment & Plan:  Problem List Items Addressed This Visit       Other   Heart burn    Pregnancy test negative Continue omprazole 40 mg once daily x 2 weeks then decrease to omeprazole 20 mg once daily x 2 weeks then d/c  If ongoing f/u with GI.  Try to decrease and or avoid spicy foods, fried fatty foods, and also caffeine and chocolate as these can increase heartburn symptoms.         Relevant Medications   omeprazole (PRILOSEC) 20 MG capsule   Nausea - Primary    pregnancy test today in office , negative       Relevant Medications   omeprazole (PRILOSEC) 20 MG capsule   Other Relevant Orders   POCT urine pregnancy (Completed)    Meds ordered this encounter  Medications  . DISCONTD: omeprazole (PRILOSEC) 20 MG capsule    Sig: Take 1 capsule (20 mg total) by mouth daily.    Dispense:  30 capsule    Refill:  3    Order Specific Question:   Supervising Provider    Answer:   BEDSOLE, AMY E [[id number]]  . omeprazole (PRILOSEC) 20 MG capsule    Sig: Take 1 capsule (20 mg total) by mouth daily.    Dispense:  30 capsule    Refill:  0    Order Specific Question:   Supervising Provider    Answer:   BEDSOLE, AMY E [[id number]]    Follow-up: Return if symptoms worsen or fail to improve with pcp.    Eugenia Pancoast, FNP

## 2021-11-26 NOTE — Assessment & Plan Note (Signed)
Pregnancy test negative Continue omprazole 40 mg once daily x 2 weeks then decrease to omeprazole 20 mg once daily x 2 weeks then d/c  If ongoing f/u with GI.  Try to decrease and or avoid spicy foods, fried fatty foods, and also caffeine and chocolate as these can increase heartburn symptoms.

## 2021-11-26 NOTE — Patient Instructions (Addendum)
Please start trial of omeprazole as prescribed. Take this medication for two weeks, administer 30 minutes prior to breakfast each am. Try to decrease and or avoid spicy foods, fried fatty foods, and also caffeine and chocolate as these can increase heartburn symptoms.  Take omeprazole 40 mg x 2 weeks then decrease to 20 mg once daily for two weeks then discontinue.   Due to recent changes in healthcare laws, you may see results of your imaging and/or laboratory studies on MyChart before I have had a chance to review them.  I understand that in some cases there may be results that are confusing or concerning to you. Please understand that not all results are received at the same time and often I may need to interpret multiple results in order to provide you with the best plan of care or course of treatment. Therefore, I ask that you please give me 2 business days to thoroughly review all your results before contacting my office for clarification. Should we see a critical lab result, you will be contacted sooner.   It was a pleasure seeing you today! Please do not hesitate to reach out with any questions and or concerns.  Regards,   Mort Sawyers FNP-C

## 2021-12-17 ENCOUNTER — Ambulatory Visit: Payer: BC Managed Care – PPO | Admitting: Family

## 2022-01-02 ENCOUNTER — Other Ambulatory Visit: Payer: Self-pay

## 2022-01-03 ENCOUNTER — Ambulatory Visit (INDEPENDENT_AMBULATORY_CARE_PROVIDER_SITE_OTHER): Payer: BC Managed Care – PPO | Admitting: Gastroenterology

## 2022-01-03 VITALS — BP 115/83 | HR 79 | Ht 65.0 in | Wt 195.0 lb

## 2022-01-03 DIAGNOSIS — K9089 Other intestinal malabsorption: Secondary | ICD-10-CM

## 2022-01-03 DIAGNOSIS — R1013 Epigastric pain: Secondary | ICD-10-CM | POA: Diagnosis not present

## 2022-01-03 NOTE — Progress Notes (Signed)
Diane Mood MD, MRCP(U.K) [address]  [address]  [place], [place] [address]  Main: [phone]  Fax: [phone]   Primary Care Physician: Diane Nest, NP  Primary Gastroenterologist:  Dr. Wyline [name]   Chief Complaint  Patient presents with   Follow-up    GERD, Diarrhea    HPI: Diane [name] is a 36 y.o. female   Summary of history :  Referred to see me back in 06/2021 for diarrhea. Previously been seen for reflux. At that point of time she had an episode of what appeared to be biliary colic subsequently underwent a laparoscopic cholecystectomy.  This rarely has been going on since she had a cholecystectomy.  2-3 bowel movements per day.  Depends on what she eats.  Usually after her meals she has watery diarrhea.  Stress makes it worse. Her father passed away 1 month back.  Since then she has had epigastric discomfort.  Denies any NSAID use.  Pain feels like her biliary colic she has had in the past.  Not on any PPI.  No weight loss  Interval history   07/11/2021-01/03/2022  07/11/2021: Celiac serology negative, H pylori breath test negative, stool tests are negative for inflammation and infection . TSH normal   Since last visit she is doing well.  She did not have to start Questran as the diarrhea resolved.  Was on 40 mg of omeprazole which resolved the epigastric discomfort and reduce it to 20 mg a day as she had symptoms when she went off of it.  20 mg/day having no symptoms.  No other questions no other complaints  Current Outpatient Medications  Medication Sig Dispense Refill   Accu-Chek Softclix Lancets lancets 1 each by Other route 4 (four) times daily. 100 each 12   albuterol (PROAIR HFA) 108 (90 Base) MCG/ACT inhaler Inhale 2 puffs into the lungs every 6 (six) hours as needed for wheezing or shortness of breath. 1 each 0   glucose blood (ACCU-CHEK SMARTVIEW) test strip Use as instructed to check blood sugars 100 each 12   levothyroxine (SYNTHROID) 50  MCG tablet TAKE 1 TABLET(50 MCG) BY MOUTH DAILY BEFORE BREAKFAST 90 tablet 3   norgestimate-ethinyl estradiol (ESTARYLLA) 0.25-35 MG-MCG tablet Take 1 tablet by mouth daily. 84 tablet 3   omeprazole (PRILOSEC) 20 MG capsule Take 1 capsule (20 mg total) by mouth daily. 30 capsule 0   fluticasone (FLONASE) 50 MCG/ACT nasal spray Place 2 sprays into both nostrils daily. (Patient not taking: Reported on 01/03/2022)     No current facility-administered medications for this visit.    Allergies as of 01/03/2022   (No Known Allergies)    ROS:  General: Negative for anorexia, weight loss, fever, chills, fatigue, weakness. ENT: Negative for hoarseness, difficulty swallowing , nasal congestion. CV: Negative for chest pain, angina, palpitations, dyspnea on exertion, peripheral edema.  Respiratory: Negative for dyspnea at rest, dyspnea on exertion, cough, sputum, wheezing.  GI: See history of present illness. GU:  Negative for dysuria, hematuria, urinary incontinence, urinary frequency, nocturnal urination.  Endo: Negative for unusual weight change.    Physical Examination:   BP 115/83 (Cuff Size: Normal)   Pulse 79   Ht 5\' 5"  (1.651 m)   Wt 195 lb (88.5 kg)   BMI 32.45 kg/m   General: Well-nourished, well-developed in no acute distress.  Eyes: No icterus. Conjunctivae pink. Neuro: Alert and oriented x 3.  Grossly intact. Skin: Warm and dry, no jaundice.   Psych: Alert  and cooperative, normal [name] and affect.   Imaging Studies: No results found.  Assessment and Plan:   Diane [name] is a 36 y.o. y/o female here to follow up for chronic diarrhea.  Status post laparoscopic cholecystectomy in 2019.  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  No other red flag signs.  Also has some epigastric discomfort recent stressful events in her family which has responded to 20 mg of Prilosec plan 1.  Diarrhea resolved continue Questran as needed as needed 2.  For dyspepsia use  Pepcid 40 mg once a day as needed stop Prilosec check H. pylori breath test   Dr Diane Mood  MD,MRCP [facility]) Follow up in as needed

## 2022-01-05 LAB — H. PYLORI BREATH TEST: H pylori Breath Test: NEGATIVE

## 2022-02-05 ENCOUNTER — Encounter: Payer: BC Managed Care – PPO | Admitting: Primary Care

## 2022-02-13 ENCOUNTER — Encounter: Payer: BC Managed Care – PPO | Admitting: Primary Care

## 2022-02-18 ENCOUNTER — Telehealth: Payer: Self-pay

## 2022-02-18 NOTE — Telephone Encounter (Signed)
Unable to reach pt or pts husband; left v/m requesting pt to cb. Sending note to lsc triage.

## 2022-02-18 NOTE — Telephone Encounter (Signed)
I spoke with pt; pt said she was going to talk with Mayra Reel NP at [facility] but Jae Dire had to cancel and then pt had to cancel the rescheduled [facility] and pt did not want to wait until end of sept to discuss with someone. Pt said she has intermittent mid chest flutter feeling that last 2 - 3  seconds  once or twice a week. Pt said last time happened was several days ago. Pt said she has not had any CP, SOB and no skipping of heart feeling. Uc and ED precautions given and pt voiced understanding. Pt said she is in no distress and will keep appt on 08/029/23 at 10 AM with Dr Milinda Antis. Pt said heart palpitations may not describe what she is having; it is just a very quick flutter feeling. Pt said she drinks 3-4 eight oz cups of coffee per day. Pt will try not to drink coffee until sees Dr Milinda Antis. Sending note to Dr Milinda Antis and Tynan CMA.

## 2022-02-18 NOTE — Telephone Encounter (Signed)
Aware, will see her then Agree with ER precautions  Will cc pcp

## 2022-02-18 NOTE — Telephone Encounter (Signed)
Noted and appreciate Dr Tower's evaluation.  

## 2022-02-19 ENCOUNTER — Ambulatory Visit: Payer: BC Managed Care – PPO | Admitting: Family Medicine

## 2022-02-19 ENCOUNTER — Encounter: Payer: Self-pay | Admitting: Family Medicine

## 2022-02-19 VITALS — BP 124/72 | HR 110 | Temp 98.0°F | Ht 65.0 in | Wt 198.0 lb

## 2022-02-19 DIAGNOSIS — R7303 Prediabetes: Secondary | ICD-10-CM | POA: Insufficient documentation

## 2022-02-19 DIAGNOSIS — R12 Heartburn: Secondary | ICD-10-CM

## 2022-02-19 DIAGNOSIS — Z8249 Family history of ischemic heart disease and other diseases of the circulatory system: Secondary | ICD-10-CM

## 2022-02-19 DIAGNOSIS — Z8632 Personal history of gestational diabetes: Secondary | ICD-10-CM | POA: Diagnosis not present

## 2022-02-19 DIAGNOSIS — E039 Hypothyroidism, unspecified: Secondary | ICD-10-CM | POA: Diagnosis not present

## 2022-02-19 DIAGNOSIS — R002 Palpitations: Secondary | ICD-10-CM

## 2022-02-19 DIAGNOSIS — Z13220 Encounter for screening for lipoid disorders: Secondary | ICD-10-CM | POA: Diagnosis not present

## 2022-02-19 DIAGNOSIS — E785 Hyperlipidemia, unspecified: Secondary | ICD-10-CM | POA: Insufficient documentation

## 2022-02-19 LAB — CBC WITH DIFFERENTIAL/PLATELET
Basophils Absolute: 0.1 10*3/uL (ref 0.0–0.1)
Basophils Relative: 1 % (ref 0.0–3.0)
Eosinophils Absolute: 0.1 10*3/uL (ref 0.0–0.7)
Eosinophils Relative: 1.4 % (ref 0.0–5.0)
HCT: 36 % (ref 36.0–46.0)
Hemoglobin: 11.7 g/dL — ABNORMAL LOW (ref 12.0–15.0)
Lymphocytes Relative: 27.1 % (ref 12.0–46.0)
Lymphs Abs: 1.6 10*3/uL (ref 0.7–4.0)
MCHC: 32.6 g/dL (ref 30.0–36.0)
MCV: 81.9 fl (ref 78.0–100.0)
Monocytes Absolute: 0.6 10*3/uL (ref 0.1–1.0)
Monocytes Relative: 9.5 % (ref 3.0–12.0)
Neutro Abs: 3.6 10*3/uL (ref 1.4–7.7)
Neutrophils Relative %: 61 % (ref 43.0–77.0)
Platelets: 301 10*3/uL (ref 150.0–400.0)
RBC: 4.4 Mil/uL (ref 3.87–5.11)
RDW: 13.2 % (ref 11.5–15.5)
WBC: 5.9 10*3/uL (ref 4.0–10.5)

## 2022-02-19 LAB — TSH: TSH: 2.46 u[IU]/mL (ref 0.35–5.50)

## 2022-02-19 LAB — HEMOGLOBIN A1C: Hgb A1c MFr Bld: 5.9 % (ref 4.6–6.5)

## 2022-02-19 LAB — LIPID PANEL
Cholesterol: 225 mg/dL — ABNORMAL HIGH (ref 0–200)
HDL: 72.3 mg/dL (ref >39.00)
LDL Cholesterol: 124 mg/dL — ABNORMAL HIGH (ref 0–99)
NonHDL: 152.69
Total CHOL/HDL Ratio: 3
Triglycerides: 141 mg/dL (ref 0.0–149.0)
VLDL: 28.2 mg/dL (ref 0.0–40.0)

## 2022-02-19 NOTE — Assessment & Plan Note (Signed)
Fleeting -per history may be pvc/pac Nl EKG today  Disc limiting caffeine  Lab today  Disc ref to cardiology in light of recent CAD in family

## 2022-02-19 NOTE — Assessment & Plan Note (Signed)
Pt is interested in disc risk factors with cardiology  Lipid panel drawn Some palpitations

## 2022-02-19 NOTE — Assessment & Plan Note (Signed)
Some palpitations TSH today Taking levothyroxine 50 mcg daily

## 2022-02-19 NOTE — Progress Notes (Signed)
Subjective:    Patient ID: Diane [name], female    DOB: 04-Dec-1985, 36 y.o.   MRN: [id number]  HPI 36 yo pt of NP Clark presents for palpitations   Wt Readings from Last 3 Encounters:  02/19/22 198 lb (89.8 kg)  01/03/22 195 lb (88.5 kg)  11/26/21 193 lb 8 oz (87.8 kg)   32.95 kg/m  Intermittent mid chest fluttering sensation (sometimes in throat area)  Makes her want to cough or clear her throat  Lasts 2-3 seconds  1-2 times per week  No trigger   Last night she felt it when lying down  It made her anxious   She took a pepcid- unsure if it helped   Father died recently of sudden cardiac death  at 64  GF- CAD Uncle with CAD      No cp or sob  Caffeine : fair amount  At least 3-4 cups of coffee (star bucks or cold brew)    H/o hypothyroid Lab Results  Component Value Date   TSH 0.74 07/25/2021   Takes levothyroxine 50 mcg daily   EKG today NSR Rate of 84 No acute changes   Lab Results  Component Value Date   WBC 5.9 10/23/2021   HGB 11.8 (L) 10/23/2021   HCT 36.0 10/23/2021   MCV 83.4 10/23/2021   PLT 320.0 10/23/2021   Lab Results  Component Value Date   CHOL 204 (H) 09/05/2020   HDL 62.80 09/05/2020   LDLCALC 117 (H) 09/05/2020   TRIG 118.0 09/05/2020   CHOLHDL 3 09/05/2020   H/o GERD Had omeprazole for a while  Now pepcid as needed   H/o gestational diabetes  No regular exercise right now  Patient Active Problem List   Diagnosis Date Noted   Palpitation 02/19/2022   History of gestational diabetes 02/19/2022   Family history of early CAD 02/19/2022   Lipid screening 02/19/2022   Heart burn 11/26/2021   Nausea 11/26/2021   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   Vitamin D deficiency 05/14/2018   Mass of right hand 02/20/2018   Asthma 02/20/2018   Stress incontinence 04/16/2017   Hypothyroidism 03/20/2015   Past Medical History:  Diagnosis Date   Anxiety    Asthma    activity induced asthma   Endometriosis    Gall stones  07/30/2017   GERD (gastroesophageal reflux disease)    Gestational diabetes mellitus    Hypothyroidism    Ovarian cyst    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   Past Surgical History:  Procedure Laterality Date   CHOLECYSTECTOMY N/A 11/28/2017   Procedure: LAPAROSCOPIC CHOLECYSTECTOMY WITH INTRAOPERATIVE CHOLANGIOGRAM;  Surgeon: Luretha Murphy, MD;  Location: [facility] ORS;  Service: General;  Laterality: N/A;   LAPAROSCOPY  2009   nsvd  2018,2016   x 2   Social History   Tobacco Use   Smoking status: Never   Smokeless tobacco: Never  Vaping Use   Vaping Use: Never used  Substance Use Topics   Alcohol use: Yes    Comment: occasional   Drug use: No   Family History  Problem Relation Age of Onset   Diabetes Father    Asthma Father    Hypertension Father    Heart disease Maternal Grandmother    Colon cancer Maternal Grandmother    Breast cancer Maternal Grandmother    Heart disease Paternal Grandfather    Pancreatic cancer Paternal Grandmother    Other Brother    Thyroid  disease Neg Hx    No Known Allergies Current Outpatient Medications on File Prior to Visit  Medication Sig Dispense Refill   Accu-Chek Softclix Lancets lancets 1 each by Other route 4 (four) times daily. 100 each 12   albuterol (PROAIR HFA) 108 (90 Base) MCG/ACT inhaler Inhale 2 puffs into the lungs every 6 (six) hours as needed for wheezing or shortness of breath. 1 each 0   glucose blood (ACCU-CHEK SMARTVIEW) test strip Use as instructed to check blood sugars 100 each 12   levothyroxine (SYNTHROID) 50 MCG tablet TAKE 1 TABLET(50 MCG) BY MOUTH DAILY BEFORE BREAKFAST 90 tablet 3   norgestimate-ethinyl estradiol (ESTARYLLA) 0.25-35 MG-MCG tablet Take 1 tablet by mouth daily. 84 tablet 3   No current facility-administered medications on file prior to visit.     Review of Systems  Constitutional:  Negative for activity change, appetite change, fatigue, fever and  unexpected weight change.  HENT:  Negative for congestion, ear pain, rhinorrhea, sinus pressure and sore throat.   Eyes:  Negative for pain, redness and visual disturbance.  Respiratory:  Negative for cough, shortness of breath and wheezing.   Cardiovascular:  Positive for palpitations. Negative for chest pain.  Gastrointestinal:  Negative for abdominal pain, blood in stool, constipation and diarrhea.       Heartburn   Endocrine: Negative for polydipsia and polyuria.  Genitourinary:  Negative for dysuria, frequency and urgency.  Musculoskeletal:  Negative for arthralgias, back pain and myalgias.  Skin:  Negative for pallor and rash.  Allergic/Immunologic: Negative for environmental allergies.  Neurological:  Negative for dizziness, syncope and headaches.  Hematological:  Negative for adenopathy. Does not bruise/bleed easily.  Psychiatric/Behavioral:  Negative for decreased concentration and dysphoric mood. The patient is not nervous/anxious.        Stressors        Objective:   Physical Exam Constitutional:      General: She is not in acute distress.    Appearance: Normal appearance. She is well-developed. She is obese. She is not ill-appearing or diaphoretic.  HENT:     Head: Normocephalic and atraumatic.  Eyes:     Conjunctiva/sclera: Conjunctivae normal.     Pupils: Pupils are equal, round, and reactive to light.  Neck:     Thyroid: No thyromegaly.     Vascular: No carotid bruit or JVD.  Cardiovascular:     Rate and Rhythm: Regular rhythm. Tachycardia present.     Pulses: Normal pulses.     Heart sounds: Normal heart sounds. No murmur heard.    No gallop.  Pulmonary:     Effort: Pulmonary effort is normal. No respiratory distress.     Breath sounds: Normal breath sounds. No stridor. No wheezing, rhonchi or rales.  Abdominal:     General: There is no distension or abdominal bruit.     Palpations: Abdomen is soft.  Musculoskeletal:     Cervical back: Normal range of  motion and neck supple.     Right lower leg: No edema.     Left lower leg: No edema.  Lymphadenopathy:     Cervical: No cervical adenopathy.  Skin:    General: Skin is warm and dry.     Coloration: Skin is not pale.     Findings: No rash.  Neurological:     Mental Status: She is alert.     Coordination: Coordination normal.     Deep Tendon Reflexes: Reflexes are normal and symmetric. Reflexes normal.  Psychiatric:  Mood and Affect: Mood is anxious.     Comments: Candidly discusses symptoms and stressors  Pleasant            Assessment & Plan:   Problem List Items Addressed This Visit       Endocrine   Hypothyroidism    Some palpitations TSH today Taking levothyroxine 50 mcg daily       Relevant Orders   TSH     Other   Family history of early CAD    Pt is interested in disc risk factors with cardiology  Lipid panel drawn Some palpitations       Relevant Orders   Ambulatory referral to Cardiology   Heart burn    Unsure if causing any of her fleeting palpitation feelings  inst to use pepcid bid for the next week and watch diet       History of gestational diabetes    a1c today  Per pt diet is good      Relevant Orders   Hemoglobin A1c   Lipid screening    Lipid panel today  Family h/o CAD Disc goals for lipids and reasons to control them Rev last labs with pt Rev low sat fat diet in detail       Relevant Orders   Lipid panel   Palpitation - Primary    Fleeting -per history may be pvc/pac Nl EKG today  Disc limiting caffeine  Lab today  Disc ref to cardiology in light of recent CAD in family        Relevant Orders   EKG 12-Lead (Completed)   TSH   CBC with Differential/Platelet   Ambulatory referral to Cardiology

## 2022-02-19 NOTE — Patient Instructions (Addendum)
Take pepcid ac twice daily for a week to see if it helps   Cut caffeine by one cup per week  See if you can get down to one cup per day   Let's check TSH and blood count and a1c and cholesterol   Labs today   You will get a call about a cardiology referral  If you don't hear in 1-2 weeks let us know   If symptoms worsen let us know

## 2022-02-19 NOTE — Assessment & Plan Note (Signed)
a1c today  Per pt diet is good

## 2022-02-19 NOTE — Assessment & Plan Note (Signed)
Unsure if causing any of her fleeting palpitation feelings  inst to use pepcid bid for the next week and watch diet

## 2022-02-19 NOTE — Assessment & Plan Note (Signed)
Lipid panel today  Family h/o CAD Disc goals for lipids and reasons to control them Rev last labs with pt Rev low sat fat diet in detail

## 2022-02-20 ENCOUNTER — Encounter: Payer: Self-pay | Admitting: *Deleted

## 2022-03-05 ENCOUNTER — Encounter: Payer: Self-pay | Admitting: Cardiology

## 2022-03-05 ENCOUNTER — Ambulatory Visit: Payer: BC Managed Care – PPO | Attending: Cardiology | Admitting: Cardiology

## 2022-03-05 ENCOUNTER — Ambulatory Visit (INDEPENDENT_AMBULATORY_CARE_PROVIDER_SITE_OTHER): Payer: BC Managed Care – PPO

## 2022-03-05 VITALS — BP 110/80 | HR 84 | Ht 65.0 in | Wt 199.0 lb

## 2022-03-05 DIAGNOSIS — R002 Palpitations: Secondary | ICD-10-CM | POA: Diagnosis not present

## 2022-03-05 DIAGNOSIS — E7800 Pure hypercholesterolemia, unspecified: Secondary | ICD-10-CM

## 2022-03-05 NOTE — Progress Notes (Signed)
Cardiology Office Note:    Date:  03/05/2022   ID:  [name], DOB 10-Jan-1986, MRN [id number]  PCP:  [name], NP   [facility] Providers Cardiologist:  None     Referring MD: [name], NP   Chief Complaint  Patient presents with   NEW patient-palpitations; Family hx of CAD   Diane [name] is a 36 y.o. female who is being seen today for the evaluation of palpitations at the request of [name], NP.   History of Present Illness:    Diane [name] is a 36 y.o. female with a hx of asthma, hypothyroidism, hyperlipidemia who presents due to palpitations.  She complains of heart flutters ongoing over the the past 3 to 6 months.  Symptoms are not associated with exertion and occur randomly/sporadically.  Last episode was a week ago, lasting a few seconds.  Symptoms are not associated with dizziness or syncope.  She denies chest pain or shortness of breath.  She drinks about 4 cups of coffee daily.  Father recently passed suddenly, cause of death unknown.  Maternal grand parents have history of CAD.  TSH obtained 2 weeks ago was normal.  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   albuterol (PROAIR HFA) 108 (90 Base)  MCG/ACT inhaler Inhale 2 puffs into the lungs every 6 (six) hours as needed for wheezing or shortness of breath.   glucose blood (ACCU-CHEK SMARTVIEW) test strip Use as instructed to check blood sugars   levothyroxine (SYNTHROID) 50 MCG tablet TAKE 1 TABLET(50 MCG) BY MOUTH DAILY BEFORE BREAKFAST   norgestimate-ethinyl estradiol (ESTARYLLA) 0.25-35 MG-MCG tablet Take 1 tablet by mouth daily.     Allergies:   Patient has no known allergies.   Social History   Socioeconomic History   Marital status: Married    Spouse name: Not on file   Number of children: Not on file   Years of education: Not on file   Highest education level: Not on file  Occupational History   Not on file  Tobacco Use   Smoking status: Never   Smokeless tobacco: Never  Vaping Use   Vaping Use: Never used  Substance and Sexual Activity   Alcohol use: Yes    Comment: occasional   Drug use: No   Sexual activity: Yes    Birth control/protection: Pill  Other Topics Concern   Not on file  Social History Narrative   Married.   2 children.   Masters in Programme researcher, broadcasting/film/video.   Enjoys reading, photography, crafting.    Social Determinants of Health   Financial Resource Strain: Not on file  Food Insecurity: Not on file  Transportation Needs: Not on file  Physical Activity: Not on file  Stress: Not on file  Social Connections: Not on file     Family History: The patient's family history includes Asthma in her father; Breast cancer in her maternal grandmother; Colon cancer in her maternal grandmother; Diabetes in her father; Heart disease in her maternal grandmother and paternal grandfather; Hypertension in her father; Other in her brother; Pancreatic cancer in her paternal grandmother. There is no history of Thyroid disease.  ROS:   Please see the history of present illness.     All other systems reviewed and are negative.  EKGs/Labs/Other Studies Reviewed:    The following studies were reviewed today:   EKG:   EKG is  ordered today.  The ekg ordered today demonstrates normal sinus rhythm, normal ECG  Recent Labs: 02/19/2022: Hemoglobin 11.7; Platelets 301.0; TSH 2.46  Recent Lipid Panel    Component Value Date/Time   CHOL 225 (H) 02/19/2022 1043   CHOL 211 (H) 01/11/2016 1103   TRIG 141.0 02/19/2022 1043   HDL 72.30 02/19/2022 1043   HDL 46 01/11/2016 1103   CHOLHDL 3 02/19/2022 1043   VLDL 28.2 02/19/2022 1043   LDLCALC 124 (H) 02/19/2022 1043   LDLCALC 124 (H) 01/11/2016 1103     Risk Assessment/Calculations:            Physical Exam:    VS:  BP 110/80 (BP Location: Right Arm, Patient Position: Sitting, Cuff Size: Large)   Pulse 84   Ht 5\' 5"  (1.651 m)   Wt 199 lb (90.3 kg)   LMP 01/28/2022   SpO2 98%   BMI 33.12 kg/m     Wt Readings from Last 3 Encounters:  03/05/22 199 lb (90.3 kg)  02/19/22 198 lb (89.8 kg)  01/03/22 195 lb (88.5 kg)     GEN: Well nourished, well developed in no acute distress HEENT: Normal NECK: No JVD; No carotid bruits CARDIAC: RRR, no murmurs, rubs, gallops RESPIRATORY:  Clear to auscultation without rales, wheezing or rhonchi  ABDOMEN: Soft, non-tender, non-distended MUSCULOSKELETAL:  No edema; No deformity  SKIN: Warm and dry NEUROLOGIC:  Alert and oriented x 3 PSYCHIATRIC:  Normal affect   ASSESSMENT:    1. Palpitations   2. Pure hypercholesterolemia    PLAN:    In order of problems listed above:  Palpitations, heart flutters.  Place cardiac monitor to evaluate any significant arrhythmias. Hyperlipidemia, not in statin benefit group.  Low-cholesterol diet advised.  Follow-up after cardiac monitor      Medication Adjustments/Labs and Tests Ordered: Current medicines are reviewed at length with the patient today.  Concerns regarding medicines are outlined above.  Orders Placed This Encounter  Procedures   LONG TERM MONITOR (3-14 DAYS)   EKG 12-Lead   No orders of the defined types were placed in this encounter.   Patient  Instructions  Medication Instructions:  Your physician recommends that you continue on your current medications as directed. Please refer to the Current Medication list given to you today.  *If you need a refill on your cardiac medications before your next appointment, please call your pharmacy*   Lab Work: None ordered If you have labs (blood work) drawn today and your tests are completely normal, you will receive your results only by: [place] (if you have MyChart) OR A paper copy in the mail If you have any lab test that is abnormal or we need to change your treatment, we will call you to review the results.   Testing/Procedures: Your  provider has ordered a heart monitor to wear for 14 days. This will be mailed to your home with instructions on placement. Once you have finished the time frame requested, you will return monitor in box provided.      Follow-Up: At [facility], you and your health needs are our priority.  As part of our continuing mission to provide you with exceptional heart care, we have created designated Provider Care Teams.  These Care Teams include your primary Cardiologist (physician) and Advanced Practice Providers (APPs -  Physician Assistants and Nurse Practitioners) who all work together to provide you with the care you need, when you need it.  We recommend signing up for the patient portal called "MyChart".  Sign up information is provided on this After Visit Summary.  MyChart is used to connect with patients for Virtual Visits (Telemedicine).  Patients are able to view lab/test results, encounter notes, upcoming appointments, etc.  Non-urgent messages can be sent to your provider as well.   To learn more about what you can do with MyChart, go to [email or web address].    Your next appointment:   4-5 week(s)  The format for your next appointment:   In Person  Provider:   You may see Debbe Odea, MD or one of the following  Advanced Practice Providers on your designated Care Team:   Nicolasa Ducking, NP Eula Listen, PA-C Cadence Fransico Michael, PA-C Charlsie Quest, NP   Other Instructions N/A  Important Information About Sugar         Signed, Debbe Odea, MD  03/05/2022 9:43 AM    [facility]

## 2022-03-05 NOTE — Patient Instructions (Signed)
Medication Instructions:  Your physician recommends that you continue on your current medications as directed. Please refer to the Current Medication list given to you today.  *If you need a refill on your cardiac medications before your next appointment, please call your pharmacy*   Lab Work: None ordered If you have labs (blood work) drawn today and your tests are completely normal, you will receive your results only by: MyChart Message (if you have MyChart) OR A paper copy in the mail If you have any lab test that is abnormal or we need to change your treatment, we will call you to review the results.   Testing/Procedures: Your provider has ordered a heart monitor to wear for 14 days. This will be mailed to your home with instructions on placement. Once you have finished the time frame requested, you will return monitor in box provided.      Follow-Up: At [facility], you and your health needs are our priority.  As part of our continuing mission to provide you with exceptional heart care, we have created designated Provider Care Teams.  These Care Teams include your primary Cardiologist (physician) and Advanced Practice Providers (APPs -  Physician Assistants and Nurse Practitioners) who all work together to provide you with the care you need, when you need it.  We recommend signing up for the patient portal called "MyChart".  Sign up information is provided on this After Visit Summary.  MyChart is used to connect with patients for Virtual Visits (Telemedicine).  Patients are able to view lab/test results, encounter notes, upcoming appointments, etc.  Non-urgent messages can be sent to your provider as well.   To learn more about what you can do with MyChart, go to [email or web address].    Your next appointment:   4-5 week(s)  The format for your next appointment:   In Person  Provider:   You may see Brian Agbor-Etang, MD or one of the following Advanced Practice  Providers on your designated Care Team:   Christopher Berge, NP Ryan Dunn, PA-C Cadence Furth, PA-C Sheri Hammock, NP    Other Instructions N/A  Important Information About Sugar       

## 2022-03-07 DIAGNOSIS — R002 Palpitations: Secondary | ICD-10-CM | POA: Diagnosis not present

## 2022-03-28 ENCOUNTER — Encounter (INDEPENDENT_AMBULATORY_CARE_PROVIDER_SITE_OTHER): Payer: BC Managed Care – PPO | Admitting: Cardiology

## 2022-03-28 DIAGNOSIS — Z8249 Family history of ischemic heart disease and other diseases of the circulatory system: Secondary | ICD-10-CM | POA: Diagnosis not present

## 2022-03-29 NOTE — Telephone Encounter (Signed)
Please see the MyChart message reply(ies) for my assessment and plan.    This patient gave consent for this Medical Advice Message and is aware that it may result in a bill to their insurance company, as well as the possibility of receiving a bill for a co-payment or deductible. They are an established patient, but are not seeking medical advice exclusively about a problem treated during an in person or video visit in the last seven days. I did not recommend an in person or video visit within seven days of my reply.    I spent a total of 12 minutes cumulative time within 7 days through MyChart messaging.  Belynda Pagaduan Agbor-Etang, MD   

## 2022-03-31 DIAGNOSIS — E039 Hypothyroidism, unspecified: Secondary | ICD-10-CM

## 2022-04-02 ENCOUNTER — Ambulatory Visit: Payer: BC Managed Care – PPO | Attending: Nurse Practitioner | Admitting: Nurse Practitioner

## 2022-04-02 ENCOUNTER — Encounter: Payer: Self-pay | Admitting: Nurse Practitioner

## 2022-04-02 VITALS — BP 122/84 | HR 94 | Ht 65.0 in | Wt 197.8 lb

## 2022-04-02 DIAGNOSIS — I471 Supraventricular tachycardia, unspecified: Secondary | ICD-10-CM

## 2022-04-02 DIAGNOSIS — R002 Palpitations: Secondary | ICD-10-CM | POA: Diagnosis not present

## 2022-04-02 MED ORDER — DILTIAZEM HCL 30 MG PO TABS: 30.0000 mg | ORAL_TABLET | Freq: Two times a day (BID) | ORAL | 11 refills | Status: DC | PRN

## 2022-04-02 NOTE — Progress Notes (Signed)
Office Visit    Patient Name: Diane [name] Date of Encounter: 04/02/2022  Primary Care Provider:  [name], NP Primary Cardiologist:  Debbe Odea, MD  Chief Complaint    36 y/o ? w/ a h/o asthma, hypothyroidism, HL, GERD, and anxiety, who presents for f/u related to palpitations.  Past Medical History    Past Medical History:  Diagnosis Date   Anxiety    Asthma    activity induced asthma   Endometriosis    Gall stones 07/30/2017   GERD (gastroesophageal reflux disease)    Gestational diabetes mellitus    Hypothyroidism    Ovarian cyst    Palpitations    a. 02/2022 Zio: predominantly sinus rhythm @ 85 (53-162). 1 symptomatic run of SVT @ 162 x 16 beats. Rare PACs.   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   Past Surgical History:  Procedure Laterality Date   CHOLECYSTECTOMY N/A 11/28/2017   Procedure: LAPAROSCOPIC CHOLECYSTECTOMY WITH INTRAOPERATIVE CHOLANGIOGRAM;  Surgeon: Luretha Murphy, MD;  Location: [facility] ORS;  Service: General;  Laterality: N/A;   LAPAROSCOPY  2009   nsvd  2018,2016   x 2    Allergies  No Known Allergies  History of Present Illness    36 y/o ? w/ a h/o asthma, hypothyroidism, HL, GERD, and anxiety.  She was previously evaluated 03/05/2022 due to palpitations.  A Zio monitor was placed and subsequently showed 1 symptomatic run of SVT at 160 bpm x 16 beats.  Rare PACs were also noted.  Since her last visit, she has continued to have intermittent palpitations.  These are typically very brief lasting from 1 to 15 seconds, occurring a few times per week, and resolving spontaneously.  There are no associated symptoms other than skipped beats or fast heart rates.  She denies chest pain, dyspnea, PND, orthopnea, dizziness, syncope, edema, or early satiety.  She continues to drink 1 venti sized Starbucks coffee a day.  She does not think that she hydrates well otherwise, and believes there  are several lifestyle issues that she could likely address.  Home Medications    Current Outpatient Medications  Medication Sig Dispense Refill   Accu-Chek Softclix Lancets lancets 1 each by Other route 4 (four) times daily. 100 each 12   albuterol (PROAIR HFA) 108 (90 Base) MCG/ACT inhaler Inhale 2 puffs into the lungs every 6 (six) hours as needed for wheezing or shortness of breath. 1 each 0   glucose blood (ACCU-CHEK SMARTVIEW) test strip Use as instructed to check blood sugars 100 each 12   levothyroxine (SYNTHROID) 50 MCG tablet TAKE 1 TABLET(50 MCG) BY MOUTH DAILY BEFORE BREAKFAST 90 tablet 3   norgestimate-ethinyl estradiol (ESTARYLLA) 0.25-35 MG-MCG tablet Take 1 tablet by mouth daily. 84 tablet 3   No current facility-administered medications for this visit.     Review of Systems    Palpitations outlined above.  She denies chest pain, dyspnea, PND, orthopnea, dizziness, syncope, edema, or early satiety.  All other systems reviewed and are otherwise negative except as noted above.    Physical Exam    VS:  BP 122/84 (BP Location: Left Arm, Patient Position: Sitting, Cuff Size: Normal)   Pulse 94   Ht 5\' 5"  (1.651 m)   Wt 197 lb 12.8 oz (89.7 kg)   SpO2 98%   BMI 32.92 kg/m  , BMI Body mass index is 32.92 kg/m.     GEN: Well nourished, well developed, in  no acute distress. HEENT: normal. Neck: Supple, no JVD, carotid bruits, or masses. Cardiac: RRR, no murmurs, rubs, or gallops. No clubbing, cyanosis, edema.  Radials/PT 2+ and equal bilaterally.  Respiratory:  Respirations regular and unlabored, clear to auscultation bilaterally. GI: Soft, nontender, nondistended, BS + x 4. MS: no deformity or atrophy. Skin: warm and dry, no rash. Neuro:  Strength and sensation are intact. Psych: Normal affect.  Accessory Clinical Findings     Lab Results  Component Value Date   WBC 5.9 02/19/2022   HGB 11.7 (L) 02/19/2022   HCT 36.0 02/19/2022   MCV 81.9 02/19/2022   PLT  301.0 02/19/2022   Lab Results  Component Value Date   CREATININE 0.73 09/05/2020   BUN 9 09/05/2020   NA 138 09/05/2020   K 4.0 09/05/2020   CL 102 09/05/2020   CO2 29 09/05/2020   Lab Results  Component Value Date   ALT 8 09/05/2020   AST 8 09/05/2020   ALKPHOS 54 09/05/2020   BILITOT 0.3 09/05/2020   Lab Results  Component Value Date   CHOL 225 (H) 02/19/2022   HDL 72.30 02/19/2022   LDLCALC 124 (H) 02/19/2022   TRIG 141.0 02/19/2022   CHOLHDL 3 02/19/2022    Lab Results  Component Value Date   HGBA1C 5.9 02/19/2022   Lab Results  Component Value Date   TSH 2.46 02/19/2022    Assessment & Plan    1.  Palpitations/PSVT: Patient recently evaluated secondary to palpitations with outpatient monitoring revealing asymptomatic episode of SVT at 162 bpm x 16 beats.  She otherwise had rare PACs.  We discussed her monitor findings, ongoing symptoms (several times per week, a few seconds, no associated symptoms).  She admits to ongoing fairly high caffeine usage with otherwise poor hydration throughout the day.  She will to cut back on caffeine.  She will also look for potential triggers.  We discussed the use of vagal maneuvers and I have also provided her with a prescription for diltiazem 30 mg to be used as needed for more prolonged or persistent palpitations.  2.  Disposition: Follow-up in 6 months or sooner if necessary.   Murray Hodgkins, NP 04/02/2022, 8:33 AM

## 2022-04-02 NOTE — Patient Instructions (Signed)
Medication Instructions:  Your physician has recommended you make the following change in your medication:   Take Cardizem 30 mg Two Times daily as needed   *If you need a refill on your cardiac medications before your next appointment, please call your pharmacy*   Lab Work: NONE   If you have labs (blood work) drawn today and your tests are completely normal, you will receive your results only by: [place] (if you have MyChart) OR A paper copy in the mail If you have any lab test that is abnormal or we need to change your treatment, we will call you to review the results.   Testing/Procedures: NONE    Follow-Up: At [facility], you and your health needs are our priority.  As part of our continuing mission to provide you with exceptional heart care, we have created designated Provider Care Teams.  These Care Teams include your primary Cardiologist (physician) and Advanced Practice Providers (APPs -  Physician Assistants and Nurse Practitioners) who all work together to provide you with the care you need, when you need it.  We recommend signing up for the patient portal called "MyChart".  Sign up information is provided on this After Visit Summary.  MyChart is used to connect with patients for Virtual Visits (Telemedicine).  Patients are able to view lab/test results, encounter notes, upcoming appointments, etc.  Non-urgent messages can be sent to your provider as well.   To learn more about what you can do with MyChart, go to [email or web address].    Your next appointment:   6 month(s)  The format for your next appointment:   In Person  Provider:   Kate Sable, MD or Murray Hodgkins, NP    Other Instructions Thank you for choosing [facility]!    Important Information About Sugar

## 2022-04-05 ENCOUNTER — Other Ambulatory Visit: Payer: Self-pay | Admitting: Primary Care

## 2022-04-05 DIAGNOSIS — E039 Hypothyroidism, unspecified: Secondary | ICD-10-CM

## 2022-04-05 MED ORDER — LEVOTHYROXINE SODIUM 50 MCG PO TABS: ORAL_TABLET | ORAL | 0 refills | Status: DC

## 2022-07-08 ENCOUNTER — Telehealth: Payer: Self-pay | Admitting: Obstetrics

## 2022-07-08 NOTE — Telephone Encounter (Signed)
Left message for patient to call office to r/s appointment. MyChart message sent

## 2022-07-10 ENCOUNTER — Encounter: Payer: Self-pay | Admitting: Obstetrics

## 2022-07-11 ENCOUNTER — Encounter: Payer: Self-pay | Admitting: Primary Care

## 2022-07-11 ENCOUNTER — Ambulatory Visit: Payer: BC Managed Care – PPO | Admitting: Primary Care

## 2022-07-11 ENCOUNTER — Ambulatory Visit: Payer: BC Managed Care – PPO | Admitting: Obstetrics

## 2022-07-11 VITALS — BP 118/78 | HR 79 | Temp 97.2°F | Ht 65.0 in | Wt 200.0 lb

## 2022-07-11 DIAGNOSIS — J3489 Other specified disorders of nose and nasal sinuses: Secondary | ICD-10-CM

## 2022-07-11 DIAGNOSIS — Z01419 Encounter for gynecological examination (general) (routine) without abnormal findings: Secondary | ICD-10-CM

## 2022-07-11 DIAGNOSIS — E039 Hypothyroidism, unspecified: Secondary | ICD-10-CM

## 2022-07-11 DIAGNOSIS — Z13220 Encounter for screening for lipoid disorders: Secondary | ICD-10-CM

## 2022-07-11 DIAGNOSIS — E559 Vitamin D deficiency, unspecified: Secondary | ICD-10-CM

## 2022-07-11 LAB — TSH: TSH: 1.95 u[IU]/mL (ref 0.35–5.50)

## 2022-07-11 LAB — T4, FREE: Free T4: 0.63 ng/dL (ref 0.60–1.60)

## 2022-07-11 MED ORDER — AMOXICILLIN-POT CLAVULANATE 875-125 MG PO TABS: 1.0000 | ORAL_TABLET | Freq: Two times a day (BID) | ORAL | 0 refills | Status: DC

## 2022-07-11 NOTE — Progress Notes (Signed)
Subjective:    Patient ID: Diane [name], female    DOB: Dec 21, 1985, 37 y.o.   MRN: [id number]  HPI  Diane [name] is a very pleasant 37 y.o. female  has a past medical history of Anxiety, Asthma, Endometriosis, Gall stones (07/30/2017), GERD (gastroesophageal reflux disease), Gestational diabetes mellitus, Hypothyroidism, Ovarian cyst, Palpitations, Stress incontinence (04/16/2017), Thyroiditis, and Thyroiditis, subacute (03/20/2015). who presents today to discuss sinus pressure and for follow up.  1) Sinus Pressure: Symptom onset about 7-10 days ago with nasal congestion. She then developed sinus pressure to the maxillary region, ear fullness with "crackling", ear pain with "twinges", headaches.   She's been using saline nasal spray.   She denies fevers, chills, body aches, sweats, cough. Overall she's feeling worse.   She has not tested for Covid-19.   2) Hypothyroidism: Chronic and currently prescribed levothyroxine 50 mcg. She ran out of her medication in October 2023 as she was due for follow up. She would like to see how her current levels are running as she's felt no different off her treatment.   She initially began levothyroxine postpartum with her 11 year old daughter. She historically takes her levothyroxine at bedtime with her birth control pill.    Review of Systems  Constitutional:  Negative for chills, fatigue and fever.  HENT:  Positive for sinus pressure and sinus pain.   Respiratory:  Negative for cough.   Neurological:  Positive for headaches.         Past Medical History:  Diagnosis Date   Anxiety    Asthma    activity induced asthma   Endometriosis    Gall stones 07/30/2017   GERD (gastroesophageal reflux disease)    Gestational diabetes mellitus    Hypothyroidism    Ovarian cyst    Palpitations    a. 02/2022 Zio: predominantly sinus rhythm @ 85 (53-162). 1 symptomatic run of SVT @ 162 x 16 beats. Rare PACs.   Stress incontinence 04/16/2017    Thyroiditis    Thyroiditis, 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    Social History   Socioeconomic History   Marital status: Married    Spouse name: Not on file   Number of children: Not on file   Years of education: Not on file   Highest education level: Not on file  Occupational History   Not on file  Tobacco Use   Smoking status: Never   Smokeless tobacco: Never  Vaping Use   Vaping Use: Never used  Substance and Sexual Activity   Alcohol use: Yes    Comment: occasional   Drug use: No   Sexual activity: Yes    Birth control/protection: Pill  Other Topics Concern   Not on file  Social History Narrative   Married.   2 children.   Masters in Scientist, physiological.   Enjoys reading, photography, crafting.    Social Determinants of Health   Financial Resource Strain: Not on file  Food Insecurity: Not on file  Transportation Needs: Not on file  Physical Activity: Not on file  Stress: Not on file  Social Connections: Not on file  Intimate Partner Violence: Not on file    Past Surgical History:  Procedure Laterality Date   CHOLECYSTECTOMY N/A 11/28/2017   Procedure: LAPAROSCOPIC CHOLECYSTECTOMY WITH INTRAOPERATIVE CHOLANGIOGRAM;  Surgeon: Johnathan Hausen, MD;  Location: [facility] ORS;  Service: General;  Laterality: N/A;   LAPAROSCOPY  2009   nsvd  [id number]   x 2    Family History  Problem Relation Age of  Onset   Diabetes Father    Asthma Father    Hypertension Father    Other Brother    Heart disease Maternal Grandmother    Colon cancer Maternal Grandmother    Breast cancer Maternal Grandmother    Pancreatic cancer Paternal Grandmother    Heart disease Paternal Grandfather    Thyroid disease Neg Hx     No Known Allergies  Current Outpatient Medications on File Prior to Visit  Medication Sig Dispense Refill   Accu-Chek Softclix Lancets lancets 1 each by Other route 4 (four) times daily. 100 each 12   albuterol (PROAIR HFA) 108 (90 Base) MCG/ACT inhaler Inhale 2 puffs into the lungs  every 6 (six) hours as needed for wheezing or shortness of breath. 1 each 0   glucose blood (ACCU-CHEK SMARTVIEW) test strip Use as instructed to check blood sugars 100 each 12   levothyroxine (SYNTHROID) 50 MCG tablet Take 1 tablet by mouth every morning on an empty stomach with water only.  No food or other medications for 30 minutes. 30 tablet 0   norgestimate-ethinyl estradiol (ESTARYLLA) 0.25-35 MG-MCG tablet Take 1 tablet by mouth daily. 84 tablet 3   diltiazem (CARDIZEM) 30 MG tablet Take 1 tablet (30 mg total) by mouth 2 (two) times daily as needed. (Patient not taking: Reported on 07/11/2022) 60 tablet 11   No current facility-administered medications on file prior to visit.    BP 118/78   Pulse 79   Temp (!) 97.2 F (36.2 C) (Temporal)   Ht 5\' 5"  (1.651 m)   Wt 200 lb (90.7 kg)   SpO2 98%   BMI 33.28 kg/m  Objective:   Physical Exam HENT:     Right Ear: Tympanic membrane and ear canal normal.     Left Ear: Tympanic membrane and ear canal normal.     Nose:     Right Sinus: No maxillary sinus tenderness or frontal sinus tenderness.     Left Sinus: No maxillary sinus tenderness or frontal sinus tenderness.     Mouth/Throat:     Pharynx: No posterior oropharyngeal erythema.  Eyes:     Conjunctiva/sclera: Conjunctivae normal.  Cardiovascular:     Rate and Rhythm: Normal rate and regular rhythm.  Pulmonary:     Effort: Pulmonary effort is normal.     Breath sounds: Normal breath sounds. No wheezing or rales.  Musculoskeletal:     Cervical back: Neck supple.  Lymphadenopathy:     Cervical: No cervical adenopathy.  Skin:    General: Skin is warm and dry.           Assessment & Plan:  Hypothyroidism, unspecified type Assessment & Plan: Off treatment since October 2023.  Checking TSH, Free T4, TPO today. Will resume levothyroxine if warranted. Discussed proper instructions for levothyroxine administration.   Orders: -     TSH -     T4, free -     Thyroid  peroxidase antibody  Sinus pressure Assessment & Plan: Given duration of symptoms, coupled with double worsening, will treat for presumed bacterial sinusitis.   Start Augmentin antibiotics 1 tablet by mouth twice daily for 7 days.  Discussed use of Flonase.  Return precautions provided.   Orders: -     Amoxicillin-Pot Clavulanate; Take 1 tablet by mouth 2 (two) times daily.  Dispense: 14 tablet; Refill: 0        Pleas Koch, NP

## 2022-07-11 NOTE — Assessment & Plan Note (Signed)
Given duration of symptoms, coupled with double worsening, will treat for presumed bacterial sinusitis.   Start Augmentin antibiotics 1 tablet by mouth twice daily for 7 days.  Discussed use of Flonase.  Return precautions provided.

## 2022-07-11 NOTE — Telephone Encounter (Signed)
Patient is scheduled for 1/29 with Dr. Hulan Fray

## 2022-07-11 NOTE — Assessment & Plan Note (Signed)
Off treatment since October 2023.  Checking TSH, Free T4, TPO today. Will resume levothyroxine if warranted. Discussed proper instructions for levothyroxine administration.

## 2022-07-11 NOTE — Patient Instructions (Signed)
Start Augmentin antibiotics for the infection Take 1 tablet by mouth twice daily for 7 days.  Nasal Congestion/Ear Pressure/Sinus Pressure: Try using Flonase (fluticasone) nasal spray. Instill 1 spray in each nostril twice daily.   Stop by the lab prior to leaving today. I will notify you of your results once received.   It was a pleasure to see you today!

## 2022-07-12 LAB — THYROID PEROXIDASE ANTIBODY: Thyroperoxidase Ab SerPl-aCnc: 1 IU/mL (ref <9)

## 2022-07-22 ENCOUNTER — Encounter: Payer: Self-pay | Admitting: Obstetrics & Gynecology

## 2022-07-22 ENCOUNTER — Ambulatory Visit (INDEPENDENT_AMBULATORY_CARE_PROVIDER_SITE_OTHER): Payer: BC Managed Care – PPO | Admitting: Obstetrics & Gynecology

## 2022-07-22 VITALS — BP 100/70 | Ht 65.0 in | Wt 202.0 lb

## 2022-07-22 DIAGNOSIS — Z01419 Encounter for gynecological examination (general) (routine) without abnormal findings: Secondary | ICD-10-CM | POA: Diagnosis not present

## 2022-07-22 DIAGNOSIS — Z0000 Encounter for general adult medical examination without abnormal findings: Secondary | ICD-10-CM

## 2022-07-22 DIAGNOSIS — E559 Vitamin D deficiency, unspecified: Secondary | ICD-10-CM | POA: Diagnosis not present

## 2022-07-22 MED ORDER — VITAMIN D (ERGOCALCIFEROL) 1.25 MG (50000 UNIT) PO CAPS: 50000.0000 [IU] | ORAL_CAPSULE | ORAL | 0 refills | Status: DC

## 2022-07-22 MED ORDER — NORGESTIMATE-ETH ESTRADIOL 0.25-35 MG-MCG PO TABS: 1.0000 | ORAL_TABLET | Freq: Every day | ORAL | 3 refills | Status: DC

## 2022-07-22 NOTE — Progress Notes (Signed)
Subjective:    Diane [name] is a 37 y.o. married P2 who presents for an annual exam. She was last seen in this office in 2019. She reports that her anxiety level has increased since the birth of her last child. Her father died last year. She is wondering if her OCPs are contributing to this. She is considering another pregnancy in the future.The patient is sexually active. GYN screening history: last pap: was normal. The patient wears seatbelts: yes. The patient participates in regular exercise: not asked. Has the patient ever been transfused or tattooed?: not asked. The patient reports that there is not domestic violence in her life.   Menstrual History: OB History     Gravida  2   Para  2   Term  2   Preterm      AB      Living  2      SAB      IAB      Ectopic      Multiple      Live Births  2           Menarche age: in high school Patient's last menstrual period was 07/17/2022. Period Cycle (Days): 28 Period Duration (Days): 4 Period Pattern: Regular Menstrual Flow: Heavy, Moderate Dysmenorrhea: (!) Mild  The following portions of the patient's history were reviewed and updated as appropriate: allergies, current medications, past family history, past medical history, past social history, past surgical history, and problem list.  Review of Systems Pertinent items are noted in HPI.  FH- + breast cancer in maternal GM (in her 43s) and in paternal GM. + pancreas and colon cancer also   Objective:    BP 100/70   Ht 5\' 5"  (1.651 m)   Wt 202 lb (91.6 kg)   LMP 07/17/2022   BMI 33.61 kg/m   General Appearance:    Alert, cooperative, no distress, appears stated age  Head:    Normocephalic, without obvious abnormality, atraumatic  Eyes:    PERRL, conjunctiva/corneas clear, EOM's intact, fundi    benign, both eyes  Ears:    Normal TM's and external ear canals, both ears  Nose:   Nares normal, septum midline, mucosa normal, no drainage    or sinus  tenderness  Throat:   Lips, mucosa, and tongue normal; teeth and gums normal  Neck:   Supple, symmetrical, trachea midline, no adenopathy;    thyroid:  no enlargement/tenderness/nodules; no carotid   bruit or JVD  Back:     Symmetric, no curvature, ROM normal, no CVA tenderness  Lungs:     Clear to auscultation bilaterally, respirations unlabored  Chest Wall:    No tenderness or deformity   Heart:    Regular rate and rhythm, S1 and S2 normal, no murmur, rub   or gallop  Breast Exam:    No tenderness, masses, or nipple abnormality  Abdomen:     Soft, non-tender, bowel sounds active all four quadrants,    no masses, no organomegaly  Genitalia:    Normal female without lesion, discharge or tenderness, normal size and shape, retroverted uterus, normal adnexal exam      Extremities:   Extremities normal, atraumatic, no cyanosis or edema  Pulses:   2+ and symmetric all extremities  Skin:   Skin color, texture, turgor normal, no rashes or lesions  Lymph nodes:   Cervical, supraclavicular, and axillary nodes normal  Neurologic:   CNII-XII intact, normal strength, sensation and reflexes  throughout  .    Assessment:    Healthy female exam.  Anxiety - we discussed that if she chooses to stop OCPs, she may consider other forms of contraception. I offered to refer her to a psychiatrist, a counselor, and/or prescribe lexapro.  FH of breast, colon and pancreas cancers - I offered genetic testing. At this time she declines  I have ordered preventative labs today and she will see her primary care provider next week.  Due to her multi-year h/o vit d def, I have ordered Vit d level and prescribed supplement x 10 weeks. I rec'd that she get this re checked in 3 months.   Plan:

## 2022-07-23 ENCOUNTER — Encounter: Payer: Self-pay | Admitting: Obstetrics & Gynecology

## 2022-07-23 LAB — CBC
Hematocrit: 35.6 % (ref 34.0–46.6)
Hemoglobin: 11.4 g/dL (ref 11.1–15.9)
MCH: 25.4 pg — ABNORMAL LOW (ref 26.6–33.0)
MCHC: 32 g/dL (ref 31.5–35.7)
MCV: 80 fL (ref 79–97)
Platelets: 372 10*3/uL (ref 150–450)
RBC: 4.48 x10E6/uL (ref 3.77–5.28)
RDW: 12.6 % (ref 11.7–15.4)
WBC: 5.9 10*3/uL (ref 3.4–10.8)

## 2022-07-23 LAB — COMPREHENSIVE METABOLIC PANEL
ALT: 5 IU/L (ref 0–32)
AST: 12 IU/L (ref 0–40)
Albumin/Globulin Ratio: 1.4 (ref 1.2–2.2)
Albumin: 4.2 g/dL (ref 3.9–4.9)
Alkaline Phosphatase: 81 IU/L (ref 44–121)
BUN/Creatinine Ratio: 12 (ref 9–23)
BUN: 8 mg/dL (ref 6–20)
Bilirubin Total: <0.2 mg/dL (ref 0.0–1.2)
CO2: 23 mmol/L (ref 20–29)
Calcium: 8.9 mg/dL (ref 8.7–10.2)
Chloride: 104 mmol/L (ref 96–106)
Creatinine, Ser: 0.67 mg/dL (ref 0.57–1.00)
Globulin, Total: 2.9 g/dL (ref 1.5–4.5)
Glucose: 89 mg/dL (ref 70–99)
Potassium: 4 mmol/L (ref 3.5–5.2)
Sodium: 141 mmol/L (ref 134–144)
Total Protein: 7.1 g/dL (ref 6.0–8.5)
eGFR: 115 mL/min/{1.73_m2} (ref >59)

## 2022-07-23 LAB — HEMOGLOBIN A1C
Est. average glucose Bld gHb Est-mCnc: 120 mg/dL
Hgb A1c MFr Bld: 5.8 % — ABNORMAL HIGH (ref 4.8–5.6)

## 2022-07-23 LAB — LIPID PANEL
Chol/HDL Ratio: 3.6 ratio (ref 0.0–4.4)
Cholesterol, Total: 231 mg/dL — ABNORMAL HIGH (ref 100–199)
HDL: 65 mg/dL (ref >39)
LDL Chol Calc (NIH): 147 mg/dL — ABNORMAL HIGH (ref 0–99)
Triglycerides: 107 mg/dL (ref 0–149)
VLDL Cholesterol Cal: 19 mg/dL (ref 5–40)

## 2022-07-23 LAB — VITAMIN D 25 HYDROXY (VIT D DEFICIENCY, FRACTURES): Vit D, 25-Hydroxy: 21.6 ng/mL — ABNORMAL LOW (ref 30.0–100.0)

## 2022-07-26 ENCOUNTER — Encounter: Payer: BC Managed Care – PPO | Admitting: Primary Care

## 2022-07-31 ENCOUNTER — Ambulatory Visit: Payer: BC Managed Care – PPO | Admitting: Family Medicine

## 2022-08-01 ENCOUNTER — Encounter: Payer: Self-pay | Admitting: Primary Care

## 2022-08-01 ENCOUNTER — Ambulatory Visit (INDEPENDENT_AMBULATORY_CARE_PROVIDER_SITE_OTHER): Payer: BC Managed Care – PPO | Admitting: Primary Care

## 2022-08-01 VITALS — BP 118/78 | HR 72 | Temp 98.2°F | Ht 65.0 in | Wt 201.0 lb

## 2022-08-01 DIAGNOSIS — Z0001 Encounter for general adult medical examination with abnormal findings: Secondary | ICD-10-CM

## 2022-08-01 DIAGNOSIS — E785 Hyperlipidemia, unspecified: Secondary | ICD-10-CM

## 2022-08-01 DIAGNOSIS — R12 Heartburn: Secondary | ICD-10-CM

## 2022-08-01 DIAGNOSIS — J4520 Mild intermittent asthma, uncomplicated: Secondary | ICD-10-CM | POA: Diagnosis not present

## 2022-08-01 DIAGNOSIS — E039 Hypothyroidism, unspecified: Secondary | ICD-10-CM

## 2022-08-01 DIAGNOSIS — R002 Palpitations: Secondary | ICD-10-CM | POA: Diagnosis not present

## 2022-08-01 DIAGNOSIS — F411 Generalized anxiety disorder: Secondary | ICD-10-CM | POA: Insufficient documentation

## 2022-08-01 DIAGNOSIS — R7303 Prediabetes: Secondary | ICD-10-CM

## 2022-08-01 NOTE — Assessment & Plan Note (Signed)
Reviewed recent A1C per GYN.   Encouraged to start exercising, work on diet.  Discussed options for healthier diet.

## 2022-08-01 NOTE — Assessment & Plan Note (Signed)
Reviewed cardiology notes from January 2024. Continue diltiazem 30 mg PRN.

## 2022-08-01 NOTE — Progress Notes (Signed)
Subjective:    Patient ID: Diane [name], female    DOB: 10-Feb-1986, 37 y.o.   MRN: [id number]  HPI  Diane [name] is a very pleasant 37 y.o. female who presents today for complete physical and follow up of chronic conditions.  She would also like to mention chronic anxiety. Chronic and intermittent for years. Symptoms include palpitations, worrying, feeling anxious, nausea, irritability. Over the last year her anxiety has increased since the passing of her father. She has never been treated for anxiety. Is interested in therapy.   Immunizations: -Tetanus: Completed in 2018 -Influenza: Did not complete, declines   Diet: Fair diet.  Exercise: No regular exercise.  Eye exam: Completes annually  Dental exam: Completes semi-annually   Pap Smear: Completed in 2023  BP Readings from Last 3 Encounters:  08/01/22 118/78  07/22/22 100/70  07/11/22 118/78       Review of Systems  Constitutional:  Negative for unexpected weight change.  HENT:  Negative for rhinorrhea.   Respiratory:  Negative for cough and shortness of breath.   Cardiovascular:  Negative for chest pain.  Gastrointestinal:  Negative for constipation and diarrhea.  Genitourinary:  Negative for difficulty urinating and menstrual problem.  Musculoskeletal:  Negative for arthralgias and myalgias.  Skin:  Negative for rash.  Allergic/Immunologic: Negative for environmental allergies.  Neurological:  Negative for dizziness and headaches.  Psychiatric/Behavioral:  The patient is nervous/anxious.        See HPI         Past Medical History:  Diagnosis Date   Anxiety    Asthma    activity induced asthma   Endometriosis    Gall stones 07/30/2017   GERD (gastroesophageal reflux disease)    Gestational diabetes mellitus    Hypothyroidism    Mass of right hand 02/20/2018   Ovarian cyst    Palpitations    a. 02/2022 Zio: predominantly sinus rhythm @ 85 (53-162). 1 symptomatic run of SVT @ 162 x 16 beats.  Rare PACs.   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    Social History   Socioeconomic History   Marital status: Married    Spouse name: Not on file   Number of children: Not on file   Years of education: Not on file   Highest education level: Not on file  Occupational History   Not on file  Tobacco Use   Smoking status: Never   Smokeless tobacco: Never  Vaping Use   Vaping Use: Never used  Substance and Sexual Activity   Alcohol use: Yes    Comment: occasional   Drug use: No   Sexual activity: Yes    Birth control/protection: Pill  Other Topics Concern   Not on file  Social History Narrative   Married.   2 children.   Masters in Scientist, physiological.   Enjoys reading, photography, crafting.    Social Determinants of Health   Financial Resource Strain: Not on file  Food Insecurity: Not on file  Transportation Needs: Not on file  Physical Activity: Not on file  Stress: Not on file  Social Connections: Not on file  Intimate Partner Violence: Not on file    Past Surgical History:  Procedure Laterality Date   CHOLECYSTECTOMY N/A 11/28/2017   Procedure: LAPAROSCOPIC CHOLECYSTECTOMY WITH INTRAOPERATIVE CHOLANGIOGRAM;  Surgeon: Johnathan Hausen, MD;  Location: [facility] ORS;  Service: General;  Laterality: N/A;   LAPAROSCOPY  2009   nsvd  [phone]   x  2    Family History  Problem Relation Age of Onset   Diabetes Father    Asthma Father    Hypertension Father    Other Brother    Heart disease Maternal Grandmother    Colon cancer Maternal Grandmother    Breast cancer Maternal Grandmother    Pancreatic cancer Paternal Grandmother    Heart disease Paternal Grandfather    Thyroid disease Neg Hx     No Known Allergies  Current Outpatient Medications on File Prior to Visit  Medication Sig Dispense Refill   Accu-Chek Softclix Lancets lancets 1 each by Other route 4 (four) times daily. 100 each 12   albuterol (PROAIR HFA) 108 (90 Base)  MCG/ACT inhaler Inhale 2 puffs into the lungs every 6 (six) hours as needed for wheezing or shortness of breath. 1 each 0   diltiazem (CARDIZEM) 30 MG tablet Take 1 tablet (30 mg total) by mouth 2 (two) times daily as needed. 60 tablet 11   glucose blood (ACCU-CHEK SMARTVIEW) test strip Use as instructed to check blood sugars 100 each 12   norgestimate-ethinyl estradiol (ESTARYLLA) 0.25-35 MG-MCG tablet Take 1 tablet by mouth daily. 84 tablet 3   Vitamin D, Ergocalciferol, (DRISDOL) 1.25 MG (50000 UNIT) CAPS capsule Take 1 capsule (50,000 Units total) by mouth every 7 (seven) days. 10 capsule 0   No current facility-administered medications on file prior to visit.    BP 118/78   Pulse 72   Temp 98.2 F (36.8 C) (Temporal)   Ht 5\' 5"  (1.651 m)   Wt 201 lb (91.2 kg)   LMP 07/17/2022 (Exact Date)   SpO2 98%   BMI 33.45 kg/m  Objective:   Physical Exam HENT:     Right Ear: Tympanic membrane and ear canal normal.     Left Ear: Tympanic membrane and ear canal normal.     Nose: Nose normal.  Eyes:     Conjunctiva/sclera: Conjunctivae normal.     Pupils: Pupils are equal, round, and reactive to light.  Neck:     Thyroid: No thyromegaly.  Cardiovascular:     Rate and Rhythm: Normal rate and regular rhythm.     Heart sounds: No murmur heard. Pulmonary:     Effort: Pulmonary effort is normal.     Breath sounds: Normal breath sounds. No rales.  Abdominal:     General: Bowel sounds are normal.     Palpations: Abdomen is soft.     Tenderness: There is no abdominal tenderness.  Musculoskeletal:        General: Normal range of motion.     Cervical back: Neck supple.  Lymphadenopathy:     Cervical: No cervical adenopathy.  Skin:    General: Skin is warm and dry.     Findings: No rash.  Neurological:     Mental Status: She is alert and oriented to person, place, and time.     Cranial Nerves: No cranial nerve deficit.     Deep Tendon Reflexes: Reflexes are normal and symmetric.   Psychiatric:        Mood and Affect: Mood normal.           Assessment & Plan:  Encounter for annual general medical examination with abnormal findings in adult Assessment & Plan: Immunizations UTD. Declines influenza vaccine. Pap smear UTD.  Discussed the importance of a healthy diet and regular exercise in order for weight loss, and to reduce the risk of further co-morbidity.  Exam stable. Labs reviewed per  GYN.  Follow up in 1 year for repeat physical.    Hypothyroidism, unspecified type Assessment & Plan: Recent thyroid studies within normal range. Remain off levothyroxine.    Palpitation Assessment & Plan: Reviewed cardiology notes from January 2024. Continue diltiazem 30 mg PRN.   Mild intermittent asthma without complication Assessment & Plan: Controlled.  Continue albuterol inhaler PRN for which she uses sparingly.    Heart burn Assessment & Plan: Intermittent, overall controlled.  Continue to monitor.    GAD (generalized anxiety disorder) Assessment & Plan: Chronic and intermittent.  Deteriorated recently.  Discussed options. She agrees for therapy. Referral placed.  Orders: -     Ambulatory referral to Psychology  Prediabetes Assessment & Plan: Reviewed recent A1C per GYN.   Encouraged to start exercising, work on diet.  Discussed options for healthier diet.    Hyperlipidemia, unspecified hyperlipidemia type Assessment & Plan: Reviewed labs from OB/GYN from January 2024.  Discussed the importance of a healthy diet and regular exercise in order for weight loss, and to reduce the risk of further co-morbidity. Discussed diet options.          Pleas Koch, NP

## 2022-08-01 NOTE — Assessment & Plan Note (Signed)
Intermittent, overall controlled.  Continue to monitor.

## 2022-08-01 NOTE — Assessment & Plan Note (Signed)
Reviewed labs from OB/GYN from January 2024.  Discussed the importance of a healthy diet and regular exercise in order for weight loss, and to reduce the risk of further co-morbidity. Discussed diet options.

## 2022-08-01 NOTE — Assessment & Plan Note (Signed)
Chronic and intermittent.  Deteriorated recently.  Discussed options. She agrees for therapy. Referral placed.

## 2022-08-01 NOTE — Assessment & Plan Note (Signed)
Immunizations UTD. Declines influenza vaccine. Pap smear UTD.  Discussed the importance of a healthy diet and regular exercise in order for weight loss, and to reduce the risk of further co-morbidity.  Exam stable. Labs reviewed per GYN.  Follow up in 1 year for repeat physical.

## 2022-08-01 NOTE — Assessment & Plan Note (Signed)
Recent thyroid studies within normal range. Remain off levothyroxine.

## 2022-08-01 NOTE — Patient Instructions (Signed)
You will either be contacted via phone regarding your referral to therapy, or you may receive a letter on your MyChart portal from our referral team with instructions for scheduling an appointment. Please let us know if you have not been contacted by anyone within two weeks.  Work on [employer] and start exercising as discussed.  Set up a lab appointment when ready to check labs.  It was a pleasure to see you today!

## 2022-08-01 NOTE — Assessment & Plan Note (Signed)
Controlled.  Continue albuterol inhaler PRN for which she uses sparingly.

## 2022-09-09 ENCOUNTER — Ambulatory Visit: Payer: BC Managed Care – PPO | Admitting: Clinical

## 2022-09-19 ENCOUNTER — Telehealth: Payer: Self-pay

## 2022-10-04 ENCOUNTER — Other Ambulatory Visit: Payer: Self-pay | Admitting: Obstetrics & Gynecology

## 2022-10-04 ENCOUNTER — Encounter: Payer: Self-pay | Admitting: Obstetrics & Gynecology

## 2022-10-04 DIAGNOSIS — Z803 Family history of malignant neoplasm of breast: Secondary | ICD-10-CM

## 2022-10-04 DIAGNOSIS — Z800 Family history of malignant neoplasm of digestive organs: Secondary | ICD-10-CM

## 2022-10-04 NOTE — Progress Notes (Signed)
My Risk testing ordered due to family history of breast, colon and pancreas cancers.

## 2022-10-07 ENCOUNTER — Ambulatory Visit: Payer: BC Managed Care – PPO | Admitting: Clinical

## 2022-10-07 DIAGNOSIS — F411 Generalized anxiety disorder: Secondary | ICD-10-CM | POA: Diagnosis not present

## 2022-10-07 NOTE — Progress Notes (Signed)
                Tatiana Courter, LCSW 

## 2022-10-07 NOTE — Progress Notes (Signed)
Behavioral Health Counselor Initial Adult Exam  Name: Diane [name] Date: 10/07/2022 MRN: [id number] DOB: 1986-01-25 PCP: [name], NP  Time spent: 10:30am - 11:01am  Guardian/Payee:  NA    Paperwork requested: No   Reason for Visit /Presenting Problem: Patient stated, "mostly a lot of urging from friends and family" and reported experiencing anxiety.    Mental Status Exam: Appearance:   Well Groomed     Behavior:  Appropriate  Motor:  Normal  Speech/Language:   Clear and Coherent  Affect:  Tearful  Mood:  anxious  Thought process:  normal  Thought content:    WNL  Sensory/Perceptual disturbances:    WNL  Orientation:  oriented to person, place, situation, and day of week  Attention:  Good  Concentration:  Good  Memory:  WNL  Fund of knowledge:   Good  Insight:    Fair  Judgment:   Good  Impulse Control:  Good   Reported Symptoms:  Patient stated, "I've always kind of dealt with anxiety and things like that". Patient reported the anxiety increased with each pregnancy. Patient reported she lost her father suddenly 16 months ago and stated, "it definitely got considerably worse". Patient reported "worrying about worse case scenario situations" and reported worry increased after the loss of her father. Patient reported irritability, overstimulated by noises in the environment, "my mind just starts spiraling", nausea when feeling anxious, shaky feeling, feeling on edge, lack of motivation when anxious, difficulty concentrating, fatigue and more likely to take a nap when feeling anxious.   Risk Assessment: Danger to Self:  No Patient denied current and past suicidal ideation and symptoms of psychosis.  Self-injurious Behavior: No Danger to Others: No Patient denied current and past homicidal ideation  Duty to Warn:no Physical Aggression / Violence:No  Access to Firearms a concern: No  Gang Involvement:No  Patient / guardian was educated about steps to  take if suicide or homicide risk level increases between visits: yes While future psychiatric events cannot be accurately predicted, the patient does not currently require acute inpatient psychiatric care and does not currently meet [facility] involuntary commitment criteria.  Substance Abuse History: Current substance abuse: Yes  patient reported drinking alcohol "on occasion" and "very rarely". Patient reported no current or past tobacco or drug use.   Past Psychiatric History:   Previous psychological history is significant for anxiety Outpatient Providers: patient reported individual therapy briefly as a child History of Psych Hospitalization: No  Psychological Testing:  none reported     Abuse History:  Victim of: No.,  none    Report needed: No. Victim of Neglect:No. Perpetrator of  none   Witness / Exposure to Domestic Violence: No   Protective Services Involvement: No  Witness to [employer] Violence:  No   Family History:  Family History  Problem Relation Age of Onset   Diabetes Father    Asthma Father    Hypertension Father    Other Brother    Heart disease Maternal Grandmother    Colon cancer Maternal Grandmother    Breast cancer Maternal Grandmother    Pancreatic cancer Paternal Grandmother    Heart disease Paternal Grandfather    Thyroid disease Neg Hx     Living situation: the patient lives with their family (husband and 2 children)   Sexual Orientation: Straight  Relationship Status: married  Name of spouse / other: Melanee Spry If a parent, number of children / ages: 2 children (daughter 68 years old, son 5  years old)  Support Systems: spouse friends brothers  Surveyor, quantity Stress:  Yes   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: No   Educational History: Education: post Engineer, maintenance (IT) work or degree  Religion/Sprituality/World View: Catholic  Any cultural differences that may affect / interfere with  treatment:  not applicable   Recreation/Hobbies: reading, spending time with her children, baking, crafts with her children, playing card games/board games with her husband and children  Stressors: Loss of her father    Strengths: talking to her husband, stepping away to take deep breaths, stated "self talking my way through it"  Barriers:  Patient stated,  "the self talk and talking myself down has gotten harder"   Legal History: Pending legal issue / charges: The patient has no significant history of legal issues. History of legal issue / charges:  none  Medical History/Surgical History: reviewed Past Medical History:  Diagnosis Date   Anxiety    Asthma    activity induced asthma   Endometriosis    Gall stones 07/30/2017   GERD (gastroesophageal reflux disease)    Gestational diabetes mellitus    Hypothyroidism    Mass of right hand 02/20/2018   Ovarian cyst    Palpitations    a. 02/2022 [employer]: predominantly sinus rhythm @ 85 (53-162). 1 symptomatic run of SVT @ 162 x 16 beats. Rare PACs.   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    Past Surgical History:  Procedure Laterality Date   CHOLECYSTECTOMY N/A 11/28/2017   Procedure: LAPAROSCOPIC CHOLECYSTECTOMY WITH INTRAOPERATIVE CHOLANGIOGRAM;  Surgeon: Luretha Murphy, MD;  Location: [facility] ORS;  Service: General;  Laterality: N/A;   LAPAROSCOPY  2009   nsvd  2018,2016   x 2    Medications: Current Outpatient Medications  Medication Sig Dispense Refill   Accu-Chek Softclix Lancets lancets 1 each by Other route 4 (four) times daily. 100 each 12   albuterol (PROAIR HFA) 108 (90 Base) MCG/ACT inhaler Inhale 2 puffs into the lungs every 6 (six) hours as needed for wheezing or shortness of breath. 1 each 0   diltiazem (CARDIZEM) 30 MG tablet Take 1 tablet (30 mg total) by mouth 2 (two) times daily as needed. 60 tablet 11   glucose blood (ACCU-CHEK SMARTVIEW) test strip Use as instructed to  check blood sugars 100 each 12   norgestimate-ethinyl estradiol (ESTARYLLA) 0.25-35 MG-MCG tablet Take 1 tablet by mouth daily. 84 tablet 3   Vitamin D, Ergocalciferol, (DRISDOL) 1.25 MG (50000 UNIT) CAPS capsule Take 1 capsule (50,000 Units total) by mouth every 7 (seven) days. 10 capsule 0   No current facility-administered medications for this visit.    No Known Allergies  Diagnoses:  Generalized anxiety disorder  Plan of Care: Patient is a 37 year old female who presented for an initial assessment.  Clinician conducted initial assessment via WebEx video from clinician's home office. Patient provided verbal consent to proceed with telehealth session and participated in session from patient's home. Patient stated, "mostly a lot of urging from friends and family" due to anxiety when clinician inquired about reason for today's visit.  Patient reported the following symptoms: "worrying about worse case scenario situations", irritability, overstimulated by noises in the environment, "my mind just starts spiraling", nausea when feeling anxious, shaky feeling, feeling on edge, lack of motivation when anxious, difficulty concentrating, fatigue and more likely to take a nap when feeling anxious.  Patient stated, "I've always kind of dealt with anxiety  and things like that" and reported symptoms increased after each pregnancy and after the loss of her father. Patient denied current and past suicidal ideation, homicidal ideation, and symptoms of psychosis. Patient reported drinking alcohol "on occasion" and "very rarely". Patient reported no current or past tobacco or drug use. Patient reported a history of individual therapy briefly as a child for treatment of anxiety. Patient reported no history of psychiatric hospitalizations. Patient reported finances and the loss of her father are current stressors. Patient identified her husband, friends, and her brothers as patient's support system. It is recommended  patient be referred to a psychiatrist for a medication management consult and recommended patient participate in individual therapy. Clinician will review recommendations and treatment plan with patient during follow up appointment.   Doree Barthel, LCSW

## 2022-10-09 ENCOUNTER — Other Ambulatory Visit: Payer: Self-pay | Admitting: Obstetrics & Gynecology

## 2022-10-17 NOTE — Telephone Encounter (Signed)
Pt made ware no questions

## 2022-10-21 ENCOUNTER — Other Ambulatory Visit: Payer: BC Managed Care – PPO

## 2022-10-21 DIAGNOSIS — Z803 Family history of malignant neoplasm of breast: Secondary | ICD-10-CM

## 2022-10-21 DIAGNOSIS — E559 Vitamin D deficiency, unspecified: Secondary | ICD-10-CM

## 2022-10-21 DIAGNOSIS — Z800 Family history of malignant neoplasm of digestive organs: Secondary | ICD-10-CM

## 2022-10-22 ENCOUNTER — Ambulatory Visit: Payer: BC Managed Care – PPO | Admitting: Primary Care

## 2022-10-22 LAB — VITAMIN D 25 HYDROXY (VIT D DEFICIENCY, FRACTURES): Vit D, 25-Hydroxy: 39.7 ng/mL (ref 30.0–100.0)

## 2022-10-23 DIAGNOSIS — Z803 Family history of malignant neoplasm of breast: Secondary | ICD-10-CM

## 2022-10-23 DIAGNOSIS — Z1589 Genetic susceptibility to other disease: Secondary | ICD-10-CM

## 2022-10-23 DIAGNOSIS — Z1371 Encounter for nonprocreative screening for genetic disease carrier status: Secondary | ICD-10-CM

## 2022-10-23 HISTORY — DX: Genetic susceptibility to other disease: Z15.89

## 2022-10-23 HISTORY — DX: Encounter for nonprocreative screening for genetic disease carrier status: Z13.71

## 2022-10-23 HISTORY — DX: Family history of malignant neoplasm of breast: Z80.3

## 2022-10-30 ENCOUNTER — Ambulatory Visit (INDEPENDENT_AMBULATORY_CARE_PROVIDER_SITE_OTHER): Payer: BC Managed Care – PPO | Admitting: Clinical

## 2022-10-30 DIAGNOSIS — F411 Generalized anxiety disorder: Secondary | ICD-10-CM

## 2022-10-30 NOTE — Progress Notes (Unsigned)
[place] [facility] Counselor/Therapist Progress Note  Patient ID: Diane [name], MRN: [id number]    Date: 10/30/22  Time Spent: 9:32  am - 10:15 am : 43 Minutes  Treatment Type: Individual Therapy.  Reported Symptoms: Patient was tearful when discussing the loss of her father and patient reported decreased sleep   Mental Status Exam: Appearance:  Well Groomed     Behavior: Appropriate  Motor: Normal  Speech/Language:  Clear and Coherent  Affect: Tearful  Mood: Patient stated, "I'm doing ok" in response to current mood.   Thought process: normal  Thought content:   WNL  Sensory/Perceptual disturbances:   WNL  Orientation: oriented to person, place, and situation  Attention: Good  Concentration: Good  Memory: WNL  Fund of knowledge:  Good  Insight:   Fair  Judgment:  Good  Impulse Control: Good   Risk Assessment: Danger to Self:  No Patient denied current suicidal ideation  Self-injurious Behavior: No Danger to Others: No Patient denied current homicidal ideation Duty to Warn:no Physical Aggression / Violence:No  Access to Firearms a concern: No  Gang Involvement:No   Subjective:  Patient reported no changes since last session. Patient stated, "I'm doing ok" and stated, "depends on the day, some days are better than others" in response to her mood since last session. Patient reported decreased sleep due to her work schedule. Patient reported difficulty falling asleep at times and reported "my mind will be spinning" when experiencing difficulty falling asleep. Patient reported feeling "a sense of dread" or "waiting for the other shoe to drop" several times per week. Patient stated, "I would be open to that" in response to participation in therapy. Patient stated, "I am hesitant to explore medication at this time" in response to recommendation for a consultation with a psychiatrist. Patient stated, "I don't know how I feel about a support group" and stated, "I would  have to think on it". Patient stated, "I just feel like my husband and my kids get the short end of the stick sometimes, my grief, my anxiety, gets in the ways of me being the wife and mother I want to be". Patient stated, "I want to feel like myself again".   Interventions: Motivational Interviewing. Clinician conducted session via caregility video from clinician's office at [facility]. Patient provided verbal consent to proceed with telehealth session and participated in session from patient's home. Clinician reviewed diagnosis and treatment recommendations. Discussed recommendation for grief/loss support group. Provided psycho education related to diagnosis and treatment. Clinician utilized motivational interviewing to explore potential goals for therapy.   Collaboration of Care: Other not required at this time.   Diagnosis:  Generalized anxiety disorder   Plan: Goals to be developed during follow up appointment on 11/04/22.                    Doree Barthel, LCSW

## 2022-11-04 ENCOUNTER — Ambulatory Visit (INDEPENDENT_AMBULATORY_CARE_PROVIDER_SITE_OTHER): Payer: BC Managed Care – PPO | Admitting: Clinical

## 2022-11-04 DIAGNOSIS — F411 Generalized anxiety disorder: Secondary | ICD-10-CM | POA: Diagnosis not present

## 2022-11-04 NOTE — Progress Notes (Signed)
[place] [facility] Counselor/Therapist Progress Note  Patient ID: Diane [name], MRN: [id number]    Date: 11/04/22  Time Spent: 12:32  pm - 1:15 pm : 43 Minutes  Treatment Type: Individual Therapy.  Reported Symptoms: Patient reported irritability, feeling anxious, and overwhelmed at times.    Mental Status Exam: Appearance:  Well Groomed     Behavior: Appropriate  Motor: Normal  Speech/Language:  Clear and Coherent  Affect: Tearful  Mood: normal  Thought process: normal  Thought content:   WNL  Sensory/Perceptual disturbances:   WNL  Orientation: oriented to person, place, and situation  Attention: Good  Concentration: Good  Memory: WNL  Fund of knowledge:  Good  Insight:   Fair  Judgment:  Good  Impulse Control: Good   Risk Assessment: Danger to Self:  No Patient denied current suicidal ideation  Self-injurious Behavior: No Danger to Others: No Patient denied current homicidal ideation Duty to Warn:no Physical Aggression / Violence:No  Access to Firearms a concern: No  Gang Involvement:No   Subjective:  Patient stated, "I've been ok" in response to patient's mood since last session. Patient reported no changes since last session. Patient stated, "I'm ok" in response to current mood. Patient stated, "everything felt very connected, almost like a self sabotaging cycle" in response to patient's goals for therapy. Patient reported she would like "to be more present" with her family when feeling anxious or overwhelmed. Patient stated, "be more patient, calm, losing my temper less" in regards to goals for therapy. Patient reported feeling overstimulated when multiple activities are occurring in her environment. Patient reported she does not regularly eat three meals per day. Patient reported feeling guilty in response to the loss of her father when patient feels happy.   Interventions: Motivational Interviewing. Clinician conducted session via caregility video from  clinician's home office. Patient provided verbal consent to proceed with telehealth session and participated in session from patient's home. Reviewed events since last session. Clinician utilized motivational interviewing to explore potential goals for therapy. Provided psycho education related to anxiety. Clinician utilized a task centered approach in collaboration with patient to develop goals for therapy. Patient participated in development of goals and agreed to goals for therapy.   Collaboration of Care: Primary Care Provider AEB Patient requested to complete a consent for her primary care provider, Vernona Rieger  Patient/Guardian was advised Release of Information must be obtained prior to any record release in order to collaborate their care with an outside provider. Patient/Guardian was advised if they have not already done so to contact [facility] to sign all necessary forms in order for [place] to release information regarding their care.   Consent: Patient/Guardian gives verbal consent for treatment and assignment of benefits for services provided during this visit. Patient/Guardian expressed understanding and agreed to proceed.   Diagnosis:  Generalized anxiety disorder   Plan: Patient is to utilize [employer] [facility], thought re-framing, behavioral activation, mindfulness and coping strategies to decrease symptoms associated with their diagnosis. Frequency: bi-weekly  Modality: individual     Long-term goal:   Reduce overall level, frequency, and intensity of the feelings of anxiety as evidenced by decreased "worrying about worse case scenario situations", irritability, overstimulation by noises in the environment, "my mind just starts spiraling", nausea when feeling anxious, shaky feeling, feeling on edge, lack of motivation when anxious, difficulty concentrating, fatigue and increased sleep when feeling anxious from 6 to 7 days/week to 0 to 1 days/week per  patient report for at least  3 consecutive months. Target Date: 11/04/23  Progress: 0   Short-term goal:  Develop and implement mindfulness strategies to increase patient being present when spending time with family and other activities Target Date: 05/07/23  Progress: 0   Increase and maintain daily self care strategies, such as, drinking water, eating 3 healthy meals per day, healthy sleep habits, exercising, and work/life balance Target Date: 05/07/23  Progress: 0   Develop and implement coping strategies and effective stress management strategies to decrease feelings of irritability and negative responses to stress Target Date: 05/07/23  Progress: 0   Identify, challenge, and replace negative thought patterns that contribute to feelings of guilt and anxiety with positive thoughts and beliefs per patient's report Target Date: 05/07/23  Progress: 0   Begin healthy grieving process Target Date: 05/07/23  Progress: 0        Doree Barthel, LCSW

## 2022-11-19 ENCOUNTER — Encounter: Payer: Self-pay | Admitting: Obstetrics and Gynecology

## 2022-12-04 ENCOUNTER — Ambulatory Visit (INDEPENDENT_AMBULATORY_CARE_PROVIDER_SITE_OTHER): Payer: BC Managed Care – PPO | Admitting: Clinical

## 2022-12-04 DIAGNOSIS — F411 Generalized anxiety disorder: Secondary | ICD-10-CM

## 2022-12-04 NOTE — Progress Notes (Signed)
[place] [facility] Counselor/Therapist Progress Note  Patient ID: Diane [name], MRN: [id number],    Date: 12/04/2022  Time Spent: 9:34am - 10:19am : 45 minutes   Treatment Type: Individual Therapy  Reported Symptoms: Patient reported symptoms of anxiety and sadness  Mental Status Exam: Appearance:  Neat and Well Groomed     Behavior: Appropriate  Motor: Normal  Speech/Language:  Clear and Coherent  Affect: Tearful when talking about the loss of patient's father  Mood: sad  Thought process: normal  Thought content:   WNL  Sensory/Perceptual disturbances:   WNL  Orientation: oriented to person, place, and situation  Attention: Good  Concentration: Good  Memory: WNL  Fund of knowledge:  Good  Insight:   Fair  Judgment:  Good  Impulse Control: Good   Risk Assessment: Danger to Self:  No Patient denied current suicidal ideation  Self-injurious Behavior: No Danger to Others: No Patient denied current homicidal ideation Duty to Warn:no Physical Aggression / Violence:No  Access to Firearms a concern: No  Gang Involvement:No   Subjective: Patient stated, "its been ok" in response to events since last session. Patient stated, "its been pretty good" in response to mood since last session. Patient stated, "I'm not expecting a great weekend with Father's Day coming up and with my mom's birthday".  Patient reported her menstrual cycle is a trigger for anxiety, irritability, and stated, "its gets harder to shake the anxious feeling". Patient reported decreased sleep is a trigger for changes in mood. Patient reported anticipating events that her father would have participated in is a trigger for changes in mood. Patient reported conflict, anticipation of conflict, or "perceived conflict" are triggers for changes in mood. Patient stated, "I will invent an entire argument in my head". Patient reported she has tried to focus on her breathing, listening to white noise, and steps away  if in a group to cope with symptoms.  Patient stated, "the idea of not feeling so sad makes me feel guilty" in regards to the loss of patient's father. Patient reported her father passed away at home and patient reported being in the home is a trigger for a change in mood. Patient stated, "I could try that" in response to the use of a grief journal.   Interventions: Cognitive Behavioral Therapy. Clinician conducted session via caregility video from clinician's office at [facility]. Patient provided verbal consent to proceed with telehealth session and participated in session from patient's home. Reviewed events since last session. Provided psycho education related to triggers. Explored triggers for changes in mood. Provided psycho education related to cognitive distortions and cognitive restructuring. Explored coping strategies that have been effective for patient in the past. Discussed the use of a grief journal. Clinician requested for homework patient complete a thought record.   Collaboration of Care: not required at this time   Diagnosis:  Generalized anxiety disorder     Plan: Patient is to utilize [employer] [facility], thought re-framing, behavioral activation, mindfulness and coping strategies to decrease symptoms associated with their diagnosis. Frequency: bi-weekly  Modality: individual      Long-term goal:   Reduce overall level, frequency, and intensity of the feelings of anxiety as evidenced by decreased "worrying about worse case scenario situations", irritability, overstimulation by noises in the environment, "my mind just starts spiraling", nausea when feeling anxious, shaky feeling, feeling on edge, lack of motivation when anxious, difficulty concentrating, fatigue and increased sleep when feeling anxious from 6 to 7 days/week to 0 to  1 days/week per patient report for at least 3 consecutive months. Target Date: 11/04/23  Progress: progressing    Short-term goal:   Develop and implement mindfulness strategies to increase patient being present when spending time with family and other activities Target Date: 05/07/23  Progress: progressing    Increase and maintain daily self care strategies, such as, drinking water, eating 3 healthy meals per day, healthy sleep habits, exercising, and work/life balance Target Date: 05/07/23  Progress: progressing    Develop and implement coping strategies and effective stress management strategies to decrease feelings of irritability and negative responses to stress Target Date: 05/07/23  Progress: progressing    Identify, challenge, and replace negative thought patterns that contribute to feelings of guilt and anxiety with positive thoughts and beliefs per patient's report Target Date: 05/07/23  Progress: progressing    Begin healthy grieving process Target Date: 05/07/23  Progress: progressing    Doree Barthel, LCSW

## 2022-12-04 NOTE — Progress Notes (Signed)
                Jahn Franchini, LCSW 

## 2022-12-06 ENCOUNTER — Ambulatory Visit: Payer: BC Managed Care – PPO | Admitting: Primary Care

## 2023-01-03 ENCOUNTER — Ambulatory Visit: Payer: BC Managed Care – PPO | Admitting: Clinical

## 2023-01-03 DIAGNOSIS — F411 Generalized anxiety disorder: Secondary | ICD-10-CM

## 2023-01-03 NOTE — Progress Notes (Signed)
[facility] Counselor/Therapist Progress Note  Patient ID: Diane [name], MRN: [id number],    Date: 01/03/2023  Time Spent: 9:32am - 10:16am : 44 minutes   Treatment Type: Individual Therapy  Reported Symptoms: Patient reported recent feelings of anxiety and worry  Mental Status Exam: Appearance:  Neat and Well Groomed     Behavior: Appropriate  Motor: Normal  Speech/Language:  Clear and Coherent  Affect: Tearful  Mood: sad when discussing the loss of patient's father  Thought process: normal  Thought content:   WNL  Sensory/Perceptual disturbances:   WNL  Orientation: oriented to person, place, and situation  Attention: Good  Concentration: Good  Memory: WNL  Fund of knowledge:  Good  Insight:   Fair  Judgment:  Good  Impulse Control: Good   Risk Assessment: Danger to Self:  No Patient denied current suicidal ideation  Self-injurious Behavior: No Danger to Others: No Patient denied current homicidal ideation Duty to Warn:no Physical Aggression / Violence:No  Access to Firearms a concern: No  Gang Involvement:No   Subjective: Patient stated, "its been ok" and stated, "I've been doing pretty well" since last session. Patient stated, "for the most part it's been good" in response to patient's mood since last session. Patient stated, "I feel ok this morning". Patient reported she feels symptoms of anxiety have a greater impact on her daily functioning versus symptoms of grief. Patient reported she documented her thought record on her phone and patient shared her thought record entries during today's session. Patient reported she recently experienced pain in her nose and reported she experienced the thought, "maybe there's something terrible wrong, maybe there's a tumor," in response to the pain. Patient reported she experienced physical symptoms of anxiety in response to the situation regarding patient's nose.  Patient reported she recently felt anxious while  laying in bed and was not able to identify a trigger for the anxiety. 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 Patient stated, "I think I am generally a passive person" and reported she does not initiate conflict. Patient reported experiencing a generalized feeling of anxiety in several of patient's entries. Patient reported her son developed the flu while staying with her parents and her father developed the flu as well. Patient reported her father died shortly after he recovered from the flu, and patient reported feeling guilt, worry, anxiety, and questions if her father having the flu contributed to her father's death. Patient reported these thoughts were triggered by her daughter becoming sick during a recent visit with patient's mother. Patient reported increased anxiety related to medical issues after the birth of her children, her brother being diagnosed with a brain tumor, and her father dying suddenly without knowledge of what caused his death. Patient stated, "I always worry that I'm missing something" in regards to medical symptoms/issues. Patient stated, "I do recognize that most of the time I'm not being rational".   Interventions: Cognitive Behavioral Therapy. Clinician conducted session via caregility video from clinician's office at [facility]. Patient provided verbal consent to proceed with telehealth session and participated in session from patient's home. Assessed patient's mood since last session and patient's current mood. Reviewed patient's thought record. Explored and identified triggers for feelings of anxiety/sadness. Explored and identified thoughts/cognitive distortions (assumptions, catastrophizing) associated with feelings of anxiety/sadness. Provided psycho education related to cognitive distortions, cognitive restructuring,  and the cycle of anxiety. Clinician requested for homework patient continue thought record and use questions to challenge cognitive distortions/negative thoughts.    Collaboration of Care: not required at this time   Diagnosis:  Generalized anxiety disorder     Plan: Patient is to utilize [employer] Therapy, thought re-framing, behavioral activation, mindfulness and coping strategies to decrease symptoms associated with their diagnosis. Frequency: bi-weekly  Modality: individual      Long-term goal:   Reduce overall level, frequency, and intensity of the feelings of anxiety as evidenced by decreased "worrying about worse case scenario situations", irritability, overstimulation by noises in the environment, "my mind just starts spiraling", nausea when feeling anxious, shaky feeling, feeling on edge, lack of motivation when anxious, difficulty concentrating, fatigue and increased sleep when feeling anxious from 6 to 7 days/week to 0 to 1 days/week per patient report for at least 3 consecutive months. Target Date: 11/04/23  Progress: progressing    Short-term goal:  Develop and implement mindfulness strategies to increase patient being present when spending time with family and other activities Target Date: 05/07/23  Progress: progressing    Increase and maintain daily self care strategies, such as, drinking water, eating 3 healthy meals per day, healthy sleep habits, exercising, and work/life balance Target Date: 05/07/23  Progress: progressing    Develop and implement coping strategies and effective stress management strategies to decrease feelings of irritability and negative responses to stress Target Date: 05/07/23  Progress: progressing    Identify, challenge, and replace negative thought patterns that contribute to feelings of guilt and anxiety with positive thoughts and beliefs per patient's report Target Date: 05/07/23  Progress: progressing    Begin healthy grieving  process Target Date: 05/07/23  Progress: progressing     Doree Barthel, LCSW

## 2023-01-03 NOTE — Progress Notes (Signed)
                Morgen Linebaugh, LCSW 

## 2023-01-20 ENCOUNTER — Ambulatory Visit: Payer: BC Managed Care – PPO | Admitting: Clinical

## 2023-01-20 ENCOUNTER — Telehealth: Payer: Self-pay

## 2023-01-20 DIAGNOSIS — F411 Generalized anxiety disorder: Secondary | ICD-10-CM

## 2023-01-20 NOTE — Progress Notes (Signed)
                Karen Sharpe, LCSW 

## 2023-01-20 NOTE — Progress Notes (Signed)
[facility] Counselor/Therapist Progress Note  Patient ID: ELLICIA WIDRIG, MRN: [id number],    Date: 01/20/2023  Time Spent: 9:31am - 10:28am : 57 minutes  Treatment Type: Individual Therapy  Reported Symptoms: Patient reported recent feelings of anxiety, panic, and worry.   Mental Status Exam: Appearance:  Neat and Well Groomed     Behavior: Appropriate  Motor: Normal  Speech/Language:  Clear and Coherent  Affect: Tearful  Mood: anxious  Thought process: normal  Thought content:   WNL  Sensory/Perceptual disturbances:   WNL  Orientation: oriented to person, place, and situation  Attention: Good  Concentration: Good  Memory: WNL  Fund of knowledge:  Good  Insight:   Good  Judgment:  Good  Impulse Control: Good   Risk Assessment: Danger to Self:  No Patient denied current suicidal ideation  Self-injurious Behavior: No Danger to Others: No Patient denied current homicidal ideation Duty to Warn:no Physical Aggression / Violence:No  Access to Firearms a concern: No  Gang Involvement:No   Subjective: Patient stated, "overall, ok" since last session and stated, "more good days than bad days". Patient reported she has experienced an increase in anxiety in regards to her children returning to school and her son starting kindergarten. Patient stated, "I don't do well with change".  Patient reported she has been thinking about how to spend her time while her children are in school. 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 Patient reported the uncertainty, anticipation, and the finality of changes triggers anxiety. Patient reported she experiences the thought of "nothing's ever going to be the same again" in response to change. Patient stated, "its been ok" in response to patient's mood  since last session. Patient reported feeling anxious today.  Patient reported she recently experienced increased anxiety during her visit with her brother. Patient reported during their visit patient's brother was agitated and verbally expressed his agitation during the visit. Patient reported feeling she can not express her thoughts/feelings to her brother for fear of his reaction. Patient reported worry related to an upcoming dermatology appointment and fear of the physician finding an issue of concern. During today's session patient disclosed her thought record entries and thoughts/feelings associated with triggers of anxiety.   Interventions: Cognitive Behavioral Therapy. Clinician conducted session via caregility video from clinician's home office. Patient provided verbal consent to proceed with telehealth session and is aware of limitations of telephone or video visits. Patient participated in session from patient's home.  Explored and identified recent triggers of anxiety. Explored activities patient can participate in while her children are in school. Explored and identified patient's thoughts associated with changes in patient's life. Assessed patient's mood since last session and patient's current mood. Discussed patient's brother's behaviors during patient's recent visit and patient's thoughts and feelings in response. Reviewed patient's thought record. Clinician requested for homework patient continue thought record and use questions to challenge cognitive distortions/negative thoughts.   Collaboration of Care: not required at this time   Diagnosis:  Generalized anxiety disorder     Plan: Patient is to utilize [employer] Therapy, thought re-framing, behavioral activation, mindfulness and coping strategies to decrease symptoms associated with their diagnosis. Frequency: bi-weekly  Modality: individual      Long-term goal:   Reduce overall level, frequency, and intensity of the  feelings of anxiety as evidenced  by decreased "worrying about worse case scenario situations", irritability, overstimulation by noises in the environment, "my mind just starts spiraling", nausea when feeling anxious, shaky feeling, feeling on edge, lack of motivation when anxious, difficulty concentrating, fatigue and increased sleep when feeling anxious from 6 to 7 days/week to 0 to 1 days/week per patient report for at least 3 consecutive months. Target Date: 11/04/23  Progress: progressing    Short-term goal:  Develop and implement mindfulness strategies to increase patient being present when spending time with family and other activities Target Date: 05/07/23  Progress: progressing    Increase and maintain daily self care strategies, such as, drinking water, eating 3 healthy meals per day, healthy sleep habits, exercising, and work/life balance Target Date: 05/07/23  Progress: progressing    Develop and implement coping strategies and effective stress management strategies to decrease feelings of irritability and negative responses to stress Target Date: 05/07/23  Progress: progressing    Identify, challenge, and replace negative thought patterns that contribute to feelings of guilt and anxiety with positive thoughts and beliefs per patient's report Target Date: 05/07/23  Progress: progressing    Begin healthy grieving process Target Date: 05/07/23  Progress: progressing     Doree Barthel, LCSW

## 2023-01-20 NOTE — Telephone Encounter (Signed)
Patient calling in to discuss results of genetic testing, asked for [place]. Please advise

## 2023-01-21 NOTE — Telephone Encounter (Signed)
Pt aware of [facility] results. 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 FH mutation has "insufficient data to assess impact of finding on cancer risk".  IBIS=18.8%/riskscore=17.8%. No increased breast screening recommendations. Recommended pt call free GC at [facility] to discuss further re: FH in future. If any extra screening warranted, recommended her children then get tested (after age 38 at this point).  Hard copy mailed to pt. F/u prn.

## 2023-02-03 ENCOUNTER — Ambulatory Visit: Payer: BC Managed Care – PPO | Admitting: Clinical

## 2023-02-20 ENCOUNTER — Ambulatory Visit: Payer: BC Managed Care – PPO | Admitting: Clinical

## 2023-02-20 DIAGNOSIS — F411 Generalized anxiety disorder: Secondary | ICD-10-CM

## 2023-02-20 NOTE — Progress Notes (Signed)
[facility] Counselor/Therapist Progress Note  Patient ID: Diane [name], MRN: [id number],    Date: 02/20/2023  Time Spent: 9:39 am - 10:26 am :  47 minutes  Treatment Type: Individual Therapy  Reported Symptoms: Patient reported recent feelings of anxiety and worry  Mental Status Exam: Appearance:  Neat and Well Groomed     Behavior: Appropriate  Motor: Normal  Speech/Language:  Clear and Coherent  Affect: Tearful when discussing children returning to school  Mood: sad when discussing children returning to school   Thought process: normal  Thought content:   WNL  Sensory/Perceptual disturbances:   WNL  Orientation: oriented to person, place, and situation  Attention: Good  Concentration: Good  Memory: WNL  Fund of knowledge:  Good  Insight:   Good  Judgment:  Good  Impulse Control: Good   Risk Assessment: Danger to Self:  No Patient denied current suicidal ideation  Self-injurious Behavior: No Danger to Others: No Patient denied current homicidal ideation Duty to Warn:no Physical Aggression / Violence:No  Access to Firearms a concern: No  Gang Involvement:No   Subjective: Patient stated, "its been ok" in response to events since last session. Patient stated, "I would say its been ok for the most part" in response to patient's mood since last session. Patient reported her children have returned back to school and she has joined the [employer]. Patient reported several people on the board have a conflict with each other and the conflict is a stressor and trigger for patient. Patient reported she "over explains" during conflict to avoid being misunderstood. 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 Patient stated, "its been kind of evenly distributed"in response to the intensity and frequency of anxiety. Patient stated, "its been hard over the past couple of weeks getting them ready to go back to school". Patient  reported she questions/worries if she spent enough time with her children prior to her children returning to school. During session, patient practiced challenging the thought/worry that she did not spend enough time with her children during the summer. Patient reported she worked at times while her children were home over the summer and identified working as evidence to support her thought. Patient reported patient/children made a bucket list of activities her children wanted to participate in during the summer and patient identified that patient/children were able to accomplish the majority of the list as evidence against patient's thought. Patient stated, "the thought record has not been great lately" and reported she would like to obtain a notebook to maintain her thought record. Patient stated, "I have been trying to do the questions when Im feeling anxious about things". Patient stated, "I feel ok right now" in response to patient's mood today.   Interventions: Cognitive Behavioral Therapy. Clinician conducted session via caregility video from clinician's office at [facility]. Patient provided verbal consent to proceed with telehealth session and is aware of limitations of telephone or video visits. Patient participated in session from patient's home. Reviewed events since last session. Assisted patient in exploring and identifying triggers for anxiety. Assisted patient in exploring positive/negatives of being involved in the PTA. Assessed the intensity and frequency of symptoms of anxiety since last session. During session, clinician assisted patient in practicing challenging cognitive distortions and assisted patient in exploring evidence for/against the thoughts. Reviewed patient's thought record and barriers to completing thought record. Assessed patient's mood since last session and patient's current mood. Provided reflective listening and validation. Clinician  requested for homework patient  continue thought record and use questions to challenge cognitive distortions/negative thoughts.    Collaboration of Care: not required at this time   Diagnosis:  Generalized anxiety disorder     Plan: Patient is to utilize [employer] Therapy, thought re-framing, behavioral activation, mindfulness and coping strategies to decrease symptoms associated with their diagnosis. Frequency: bi-weekly  Modality: individual      Long-term goal:   Reduce overall level, frequency, and intensity of the feelings of anxiety as evidenced by decreased "worrying about worse case scenario situations", irritability, overstimulation by noises in the environment, "my mind just starts spiraling", nausea when feeling anxious, shaky feeling, feeling on edge, lack of motivation when anxious, difficulty concentrating, fatigue and increased sleep when feeling anxious from 6 to 7 days/week to 0 to 1 days/week per patient report for at least 3 consecutive months. Target Date: 11/04/23  Progress: progressing    Short-term goal:  Develop and implement mindfulness strategies to increase patient being present when spending time with family and other activities Target Date: 05/07/23  Progress: progressing    Increase and maintain daily self care strategies, such as, drinking water, eating 3 healthy meals per day, healthy sleep habits, exercising, and work/life balance Target Date: 05/07/23  Progress: progressing    Develop and implement coping strategies and effective stress management strategies to decrease feelings of irritability and negative responses to stress Target Date: 05/07/23  Progress: progressing    Identify, challenge, and replace negative thought patterns that contribute to feelings of guilt and anxiety with positive thoughts and beliefs per patient's report Target Date: 05/07/23  Progress: progressing    Begin healthy grieving process Target Date: 05/07/23  Progress: progressing    Doree Barthel, LCSW

## 2023-02-20 NOTE — Progress Notes (Signed)
                Karen Sharpe, LCSW 

## 2023-03-10 ENCOUNTER — Other Ambulatory Visit: Payer: Self-pay | Admitting: Primary Care

## 2023-03-10 ENCOUNTER — Ambulatory Visit (INDEPENDENT_AMBULATORY_CARE_PROVIDER_SITE_OTHER): Payer: BC Managed Care – PPO | Admitting: Clinical

## 2023-03-10 DIAGNOSIS — F411 Generalized anxiety disorder: Secondary | ICD-10-CM

## 2023-03-10 DIAGNOSIS — J4520 Mild intermittent asthma, uncomplicated: Secondary | ICD-10-CM

## 2023-03-10 MED ORDER — ALBUTEROL SULFATE HFA 108 (90 BASE) MCG/ACT IN AERS: 2.0000 | INHALATION_SPRAY | Freq: Four times a day (QID) | RESPIRATORY_TRACT | 0 refills | Status: AC | PRN

## 2023-03-10 NOTE — Progress Notes (Signed)
[facility] Counselor/Therapist Progress Note  Patient ID: Diane [name], MRN: [id number],    Date: 03/10/2023  Time Spent: 8:36am - 9:26am : 50 minutes   Treatment Type: Individual Therapy  Reported Symptoms: Patient reported sadness and recent feelings of anxiety.   Mental Status Exam: Appearance:  Neat and Well Groomed     Behavior: Appropriate  Motor: Normal  Speech/Language:  Clear and Coherent  Affect: Tearful  Mood: sad  Thought process: normal  Thought content:   WNL  Sensory/Perceptual disturbances:   WNL  Orientation: oriented to person, place, and situation  Attention: Good  Concentration: Good  Memory: WNL  Fund of knowledge:  Good  Insight:   Good  Judgment:  Good  Impulse Control: Good   Risk Assessment: Danger to Self:  No Patient denied current suicidal ideation  Self-injurious Behavior: No Danger to Others: No Patient denied current homicidal ideation Duty to Warn:no Physical Aggression / Violence:No  Access to Firearms a concern: No  Gang Involvement:No   Subjective: Patient stated, "overall it's been ok I guess" in response to events since last session and stated, "its not been the best weekend".  Patient reported on Friday patient was feeling anxious and feeling a "sense of dread". Patient reported her mother was going to attend her child's soccer game but later advised patient she was not going to attend. Patient reported she feels her mother is "struggling" with the loss of patient's father. Patient reported her father's birthday is next week. Patient reported she is worried about her mother. Patient reported her mother declining to attend the soccer game was a reminder of the change in patient's mother since her father passed away. Patient stated, "we lost a lot of my mom too". Patient stated, "I just feel torn, I feel guilty" in reference to when patient is spending time at patient's home. Patient reported feeling angry in response to  her father's passing. Patient reported difficulty balancing providing support to her mother and spending time at patient's home.   Interventions: Cognitive Behavioral Therapy and supportive therapy . Clinician conducted session via caregility video from clinician's home office. Patient provided verbal consent to proceed with telehealth session and is aware of limitations of telephone or video visits. Patient participated in session from patient's home. Reviewed events since last session. Assessed patient's mood since last session and patient's current mood.  Assisted patient in exploring and identifying triggers for recent feelings of anxiety. Assisted patient in discussing and identifying thoughts/feelings triggered by patient's mother not attending grandchild's soccer game. Provided supportive therapy, reflective listening, and validation as patient discussed patient's thoughts/feeling associated with her father's passing. Provided psycho education related to grief. Discussed use of a grief journal. Clinician requested for homework patient continue thought record and use questions to challenge cognitive distortions/negative thoughts.    Collaboration of Care: not required at this time   Diagnosis:  Generalized anxiety disorder     Plan: Patient is to utilize [employer] Therapy, thought re-framing, behavioral activation, mindfulness and coping strategies to decrease symptoms associated with their diagnosis. Frequency: bi-weekly  Modality: individual      Long-term goal:   Reduce overall level, frequency, and intensity of the feelings of anxiety as evidenced by decreased "worrying about worse case scenario situations", irritability, overstimulation by noises in the environment, "my mind just starts spiraling", nausea when feeling anxious, shaky feeling, feeling on edge, lack of motivation when anxious, difficulty concentrating, fatigue and increased sleep when feeling anxious from 6 to 7  days/week to 0 to 1 days/week per patient report for at least 3 consecutive months. Target Date: 11/04/23  Progress: progressing    Short-term goal:  Develop and implement mindfulness strategies to increase patient being present when spending time with family and other activities Target Date: 05/07/23  Progress: progressing    Increase and maintain daily self care strategies, such as, drinking water, eating 3 healthy meals per day, healthy sleep habits, exercising, and work/life balance Target Date: 05/07/23  Progress: progressing    Develop and implement coping strategies and effective stress management strategies to decrease feelings of irritability and negative responses to stress Target Date: 05/07/23  Progress: progressing    Identify, challenge, and replace negative thought patterns that contribute to feelings of guilt and anxiety with positive thoughts and beliefs per patient's report Target Date: 05/07/23  Progress: progressing    Begin healthy grieving process Target Date: 05/07/23  Progress: progressing     Doree Barthel, LCSW

## 2023-03-10 NOTE — Progress Notes (Signed)
                Dezi Schaner, LCSW 

## 2023-03-14 ENCOUNTER — Ambulatory Visit: Payer: BC Managed Care – PPO | Admitting: Family

## 2023-03-14 ENCOUNTER — Encounter: Payer: Self-pay | Admitting: Family

## 2023-03-14 VITALS — BP 122/70 | HR 97 | Temp 97.3°F | Ht 65.0 in | Wt 205.0 lb

## 2023-03-14 DIAGNOSIS — J069 Acute upper respiratory infection, unspecified: Secondary | ICD-10-CM | POA: Diagnosis not present

## 2023-03-14 DIAGNOSIS — J029 Acute pharyngitis, unspecified: Secondary | ICD-10-CM

## 2023-03-14 LAB — POCT RAPID STREP A DIPSTICK TEST: Rapid Strep A Screen: NEGATIVE

## 2023-03-14 MED ORDER — PREDNISONE 20 MG PO TABS: 40.0000 mg | ORAL_TABLET | Freq: Every day | ORAL | 0 refills | Status: DC

## 2023-03-14 NOTE — Progress Notes (Signed)
Acute Office Visit  Subjective:     Patient ID: Diane [name], female    DOB: 07/09/1985, 37 y.o.   MRN: [id number]  Chief Complaint  Patient presents with  . Ear Pain    Pressure and pain in ear/ neck pain. Has worsened over the past week    HPI 37 year old, white female, patient of Jerelyn Charles presents today with complaints of cough, congestion, neck discomfort and fatigue x 1 week.  She has been taking ibuprofen that has not helped much.  Has taken 2 COVID test at home that were both negative.  Reports children have been sick with viral illness and since seen by PCP.  She is concerned that maybe she has strep because in the past when she is felt like this it has been strep pharyngitis.  Review of Systems  Constitutional:  Positive for fever.  HENT:  Positive for congestion and sore throat.        Post nasal drip  Respiratory:  Positive for cough.   Cardiovascular: Negative.   Musculoskeletal: Negative.   Neurological: Negative.   Psychiatric/Behavioral: Negative.    All other systems reviewed and are negative.  Past Medical History:  Diagnosis Date  . Anxiety   . Asthma    activity induced asthma  . BRCA negative 10/2022   MyRisk neg except AXIN2 VUS/FH carrier  . Endometriosis   . Family history of breast cancer 10/2022   riskscore=17.8%/IBIS=18.8%  . Gall stones 07/30/2017  . GERD (gastroesophageal reflux disease)   . Gestational diabetes mellitus   . Hypothyroidism   . Mass of right hand 02/20/2018  . 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 Ovarian cyst   . Palpitations    a. 02/2022 Zio: predominantly sinus rhythm @ 85 (53-162). 1 symptomatic run of SVT @ 162 x 16 beats. Rare PACs.  . Stress incontinence 04/16/2017  . Thyroiditis   . 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 Marital status: Married    Spouse name: Not on file  . Number of children: Not on file  . Years of  education: Not on file  . Highest education level: Not on file  Occupational History  . Not on file  Tobacco Use  . Smoking status: Never  . Smokeless tobacco: Never  Vaping Use  . Vaping status: Never Used  Substance and Sexual Activity  . Alcohol use: Yes    Comment: occasional  . Drug use: No  . Sexual activity: Yes    Birth control/protection: Pill  Other Topics Concern  . Not on file  Social History Narrative   Married.   2 children.   Masters in Programme researcher, broadcasting/film/video.   Enjoys reading, photography, crafting.    Social Determinants of Health   Financial Resource Strain: Not on file  Food Insecurity: Not on file  Transportation Needs: Not on file  Physical Activity: Not on file  Stress: Not on file  Social Connections: Not on file  Intimate Partner Violence: Not on file    Past Surgical History:  Procedure Laterality Date  . CHOLECYSTECTOMY N/A 11/28/2017   Procedure: LAPAROSCOPIC CHOLECYSTECTOMY WITH INTRAOPERATIVE CHOLANGIOGRAM;  Surgeon: Luretha Murphy, MD;  Location: [facility] ORS;  Service: General;  Laterality: N/A;  . LAPAROSCOPY  2009  . nsvd  [id number]   x 2    Family History  Problem Relation Age of Onset  . Diabetes Father   . Asthma Father   .  Hypertension Father   . Other Brother   . Heart disease Maternal Grandmother   . Colon cancer Maternal Grandmother   . Breast cancer Maternal Grandmother   . Pancreatic cancer Paternal Grandmother   . Heart disease Paternal Grandfather   . Thyroid disease Neg Hx     No Known Allergies  Current Outpatient Medications on File Prior to Visit  Medication Sig Dispense Refill  . Accu-Chek Softclix Lancets lancets 1 each by Other route 4 (four) times daily. 100 each 12  . albuterol (PROAIR HFA) 108 (90 Base) MCG/ACT inhaler Inhale 2 puffs into the lungs every 6 (six) hours as needed for wheezing or shortness of breath. 1 each 0  . diltiazem (CARDIZEM) 30 MG tablet Take 1 tablet (30 mg total) by mouth 2 (two) times daily as  needed. 60 tablet 11  . glucose blood (ACCU-CHEK SMARTVIEW) test strip Use as instructed to check blood sugars 100 each 12  . norgestimate-ethinyl estradiol (ESTARYLLA) 0.25-35 MG-MCG tablet Take 1 tablet by mouth daily. 84 tablet 3  . Vitamin D, Ergocalciferol, (DRISDOL) 1.25 MG (50000 UNIT) CAPS capsule Take 1 capsule (50,000 Units total) by mouth every 7 (seven) days. 10 capsule 0   No current facility-administered medications on file prior to visit.    BP 122/70   Pulse 97   Temp (!) 97.3 F (36.3 C) (Temporal)   Ht 5\' 5"  (1.651 m)   Wt 205 lb (93 kg)   SpO2 100%   BMI 34.11 kg/m chart     Objective:    BP 122/70   Pulse 97   Temp (!) 97.3 F (36.3 C) (Temporal)   Ht 5\' 5"  (1.651 m)   Wt 205 lb (93 kg)   SpO2 100%   BMI 34.11 kg/m    Physical Exam Vitals reviewed.  Constitutional:      Appearance: Normal appearance. She is normal weight.  HENT:     Right Ear: Tympanic membrane, ear canal and external ear normal.     Left Ear: Tympanic membrane, ear canal and external ear normal.     Nose: Congestion present.     Mouth/Throat:     Mouth: Mucous membranes are moist.  Cardiovascular:     Rate and Rhythm: Normal rate and regular rhythm.  Pulmonary:     Effort: Pulmonary effort is normal.     Breath sounds: Normal breath sounds.  Musculoskeletal:        General: Normal range of motion.     Cervical back: Normal range of motion and neck supple. No rigidity.  Lymphadenopathy:     Cervical: No cervical adenopathy.  Skin:    General: Skin is warm and dry.  Neurological:     General: No focal deficit present.     Mental Status: She is alert and oriented to person, place, and time.  Psychiatric:        Mood and Affect: Mood normal.        Behavior: Behavior normal.   Results for orders placed or performed in visit on 03/14/23  POCT Rapid Strep A Dipstick Test  Result Value Ref Range   Rapid Strep A Screen Negative Negative        Assessment & Plan:    Problem List Items Addressed This Visit   None Visit Diagnoses     Upper respiratory tract infection, unspecified type    -  Primary   Relevant Orders   POCT Rapid Strep A Dipstick Test (Completed)   Pharyngitis,  unspecified etiology       Relevant Orders   POCT Rapid Strep A Dipstick Test (Completed)       Meds ordered this encounter  Medications  . predniSONE (DELTASONE) 20 MG tablet    Sig: Take 2 tablets (40 mg total) by mouth daily with breakfast.    Dispense:  10 tablet    Refill:  0   Call the office if symptoms worsen or persist.  Recheck as scheduled and sooner as needed. No follow-ups on file.  Eulis Foster, FNP

## 2023-03-24 ENCOUNTER — Ambulatory Visit (INDEPENDENT_AMBULATORY_CARE_PROVIDER_SITE_OTHER): Payer: BC Managed Care – PPO | Admitting: Clinical

## 2023-03-24 DIAGNOSIS — F411 Generalized anxiety disorder: Secondary | ICD-10-CM

## 2023-03-24 NOTE — Progress Notes (Signed)
[facility] Counselor/Therapist Progress Note  Patient ID: KYAIRA TRANTHAM, MRN: [id number],    Date: 03/24/2023  Time Spent: 8:33am - 9:22am : 49 minutes   Treatment Type: Individual Therapy  Reported Symptoms: Patient reported recent feelings of sadness and anxiety  Mental Status Exam: Appearance:  Neat and Well Groomed     Behavior: Appropriate  Motor: Normal  Speech/Language:  Clear and Coherent  Affect: Tearful when discussing patient's father  Mood: sad when discussing patient's father  Thought process: normal  Thought content:   WNL  Sensory/Perceptual disturbances:   WNL  Orientation: oriented to person, place, and situation  Attention: Good  Concentration: Good  Memory: WNL  Fund of knowledge:  Good  Insight:   Good  Judgment:  Good  Impulse Control: Good   Risk Assessment: Danger to Self:  No Patient denied current suicidal ideation  Self-injurious Behavior: No Danger to Others: No Patient denied current homicidal ideation Duty to Warn:no  Physical Aggression / Violence:No  Access to Firearms a concern: No  Gang Involvement:No   Subjective: Patient stated, "it was a rough couple of days around my dad's birthday".  Patient stated, "I cried a lot" in response to her father's birthday. Patient reported she tried to stay busy on the day of her father's birthday. Patient reported she recently experienced a neck injury and was experiencing difficulty moving her neck for a week. Patient reported she utilized questions to challenge cognitive distortions in response to the neck injury. Patient stated, "it was definitely a rough moment", "I was eventually able to calm down" in response to patient's implementation of challenging cognitive distortions. Patient reported she recognizes the thoughts/fears associated with patient's neck injury were irrational. Patient reported she experienced "what if" thoughts and catastrophizing thoughts in response to neck injury.  Patient reported patient's brother's experience with medical conditions contribute to patient's thoughts/worry. Patient reported during a recent visit to her mother's home, patient's daughter asked to sleep in parents' guest room. Patient reported her parents' guest room is where her father passed away. Patient stated, "I couldn't get it out of my mind" in regards to daughter sleeping in the guest room. Patient reported her daughter sleeping in the guest room triggered thoughts about the night her father passed away. Patient reported feelings of panic and catastrophizing thoughts recently when her son had a fever and when her daughter entered the car crying. Patient stated, "I feel good today".   Interventions: Cognitive Behavioral Therapy. Clinician conducted session via caregility video from clinician's home office. Patient provided verbal consent to proceed with telehealth session and is aware of limitations of telephone or video visits. Patient participated in session from patient's home. Reviewed events since last session. Provided supportive therapy, active listening, and validation as patient discussed her father's birthday and patient's response. Explored ways in which patient can honor her father's memory. Assisted patient in discussing and identifying thoughts/feelings triggered by recent neck pain. Discussed patient's implementation of challenging cognitive distortions and the outcome. Reviewed patient's thought record. Assisted patient in exploring and identifying thoughts associated with patient's daughter sleeping in parents' guest room. Assessed patient's mood since last session and assessed patient's current mood. Normalized patient's feelings associated with grief. Clinician requested for homework patient continue thought record and use questions to challenge cognitive distortions/negative thoughts.    Collaboration of Care: not required at this time   Diagnosis:  Generalized anxiety  disorder     Plan: Patient is to utilize [employer] Therapy, thought re-framing,  behavioral activation, mindfulness and coping strategies to decrease symptoms associated with their diagnosis. Frequency: bi-weekly  Modality: individual      Long-term goal:   Reduce overall level, frequency, and intensity of the feelings of anxiety as evidenced by decreased "worrying about worse case scenario situations", irritability, overstimulation by noises in the environment, "my mind just starts spiraling", nausea when feeling anxious, shaky feeling, feeling on edge, lack of motivation when anxious, difficulty concentrating, fatigue and increased sleep when feeling anxious from 6 to 7 days/week to 0 to 1 days/week per patient report for at least 3 consecutive months. Target Date: 11/04/23  Progress: progressing    Short-term goal:  Develop and implement mindfulness strategies to increase patient being present when spending time with family and other activities Target Date: 05/07/23  Progress: progressing    Increase and maintain daily self care strategies, such as, drinking water, eating 3 healthy meals per day, healthy sleep habits, exercising, and work/life balance Target Date: 05/07/23  Progress: progressing    Develop and implement coping strategies and effective stress management strategies to decrease feelings of irritability and negative responses to stress Target Date: 05/07/23  Progress: progressing    Identify, challenge, and replace negative thought patterns that contribute to feelings of guilt and anxiety with positive thoughts and beliefs per patient's report Target Date: 05/07/23  Progress: progressing    Begin healthy grieving process Target Date: 05/07/23  Progress: progressing    Doree Barthel, LCSW

## 2023-03-24 NOTE — Progress Notes (Signed)
                Dezi Schaner, LCSW 

## 2023-04-07 ENCOUNTER — Ambulatory Visit (INDEPENDENT_AMBULATORY_CARE_PROVIDER_SITE_OTHER): Payer: BC Managed Care – PPO | Admitting: Clinical

## 2023-04-07 DIAGNOSIS — F411 Generalized anxiety disorder: Secondary | ICD-10-CM | POA: Diagnosis not present

## 2023-04-07 NOTE — Progress Notes (Unsigned)
[facility] Counselor/Therapist Progress Note  Patient ID: Diane [name], MRN: [id number],    Date: 04/07/2023  Time Spent: 10:32am - 11:21am : 49 minutes   Treatment Type: Individual Therapy  Reported Symptoms: sadness, worry  Mental Status Exam: Appearance:  Neat and Well Groomed     Behavior: Appropriate  Motor: Normal  Speech/Language:  Clear and Coherent  Affect: Tearful  Mood: sad  Thought process: normal  Thought content:   WNL  Sensory/Perceptual disturbances:   WNL  Orientation: oriented to person, place, and situation  Attention: Good  Concentration: Good  Memory: WNL  Fund of knowledge:  Good  Insight:   Good  Judgment:  Good  Impulse Control: Good   Risk Assessment: Danger to Self:  No Patient denied current suicidal ideation  Self-injurious Behavior: No Danger to Others: No Patient denied current homicidal ideation Duty to Warn:no Physical Aggression / Violence:No  Access to Firearms a concern: No  Gang Involvement:No   Subjective: Patient stated, "its been going ok for the most part a couple bumps along the road". Discussed the bumps - husband had planned a day for patietn/husband and children were going to stay with her mother - daughter and son began not feeling well. Patient reported her thoughts "spiraled" and reported it triggered thoughts of her children becoming sick and staying with her parents two weeks prior to her father's death - "there's been a lot of guilt since then" and stated, "I worry that I contributed".  Patient reported patient/husband cancelled their plans. "I just couldn't get out of my head", "I worried too much abotu all of it". Patient reproted she tried to [place] the socratic questions to challenge patient's thoughts - "I was able to label all of the eivdence that he's fine". "I couldn't get the fear out of my head". "I was afraid I was going to bring the kids there again I was going to get my mom sick and my mom was  going to die". Challenged thoughts and requested patient practice socratic questions during session in response to thought. Patietn stated, "none" in repsonse to evidence to support patietn's thougth regarding her father's death and her mother. "I've always been prone to catastrophizing" and reported her father's death and her brother illness has contributed. "My brain just sabotogzes me sometimes" and reported she expeirences thoughts related to a fear of dying. "I just have a big fear for myself, my familiy, of missing something, missing a sign, missing a symptom". Discussed recommendation for a consultation with a psychiatrist - "I dont know Lavenia Atlas never really wanted to take something", "I dont know I would have to think about it". Discussed a consult with colleague for EMDR.   Interventions: Cognitive Behavioral Therapy. Clinician conducted session via caregility video from clinician's home office. Patient provided verbal consent to proceed with telehealth session and is aware of limitations of telephone or video visits. Patient participated in session from patient's home. Reviewed events since last session. Reviewed patient's thought record. Assisted patient in discussing and identifying thoughts/feelings triggered by her children not feeling well. Explored, identified, and challenged thought distortions/automatic negative thoughts and replaced with rational beliefs.   Last -  Clinician conducted session via caregility video from clinician's home office. Patient provided verbal consent to proceed with telehealth session and is aware of limitations of telephone or video visits. Patient participated in session from patient's home. Reviewed events since last session. Provided supportive therapy, active listening, and validation as patient discussed her father's birthday  and patient's response. Explored ways in which patient can honor her father's memory. Assisted patient in discussing and identifying  thoughts/feelings triggered by recent neck pain. Discussed patient's implementation of challenging cognitive distortions and the outcome. Reviewed patient's thought record. Assisted patient in exploring and identifying thoughts associated with patient's daughter sleeping in parents' guest room. Assessed patient's mood since last session and assessed patient's current mood. Normalized patient's feelings associated with grief. Clinician requested for homework patient continue thought record and use questions to challenge cognitive distortions/negative thoughts.    Collaboration of Care: not required at this time   Diagnosis:  Generalized anxiety disorder     Plan: Patient is to utilize [employer] Therapy, thought re-framing, behavioral activation, mindfulness and coping strategies to decrease symptoms associated with their diagnosis. Frequency: bi-weekly  Modality: individual      Long-term goal:   Reduce overall level, frequency, and intensity of the feelings of anxiety as evidenced by decreased "worrying about worse case scenario situations", irritability, overstimulation by noises in the environment, "my mind just starts spiraling", nausea when feeling anxious, shaky feeling, feeling on edge, lack of motivation when anxious, difficulty concentrating, fatigue and increased sleep when feeling anxious from 6 to 7 days/week to 0 to 1 days/week per patient report for at least 3 consecutive months. Target Date: 11/04/23  Progress: progressing    Short-term goal:  Develop and implement mindfulness strategies to increase patient being present when spending time with family and other activities Target Date: 05/07/23  Progress: progressing    Increase and maintain daily self care strategies, such as, drinking water, eating 3 healthy meals per day, healthy sleep habits, exercising, and work/life balance Target Date: 05/07/23  Progress: progressing    Develop and implement coping strategies  and effective stress management strategies to decrease feelings of irritability and negative responses to stress Target Date: 05/07/23  Progress: progressing    Identify, challenge, and replace negative thought patterns that contribute to feelings of guilt and anxiety with positive thoughts and beliefs per patient's report Target Date: 05/07/23  Progress: progressing    Begin healthy grieving process Target Date: 05/07/23  Progress: progressing     Doree Barthel, LCSW

## 2023-04-07 NOTE — Progress Notes (Unsigned)
                Dezi Schaner, LCSW 

## 2023-04-18 ENCOUNTER — Ambulatory Visit: Payer: BC Managed Care – PPO | Admitting: Clinical

## 2023-05-01 ENCOUNTER — Ambulatory Visit: Payer: BC Managed Care – PPO | Admitting: Clinical

## 2023-05-01 DIAGNOSIS — F411 Generalized anxiety disorder: Secondary | ICD-10-CM
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
## 2023-05-01 NOTE — Progress Notes (Signed)
                Dezi Schaner, LCSW 

## 2023-05-19 ENCOUNTER — Ambulatory Visit (INDEPENDENT_AMBULATORY_CARE_PROVIDER_SITE_OTHER): Payer: BC Managed Care – PPO | Admitting: Clinical

## 2023-05-19 DIAGNOSIS — F411 Generalized anxiety disorder: Secondary | ICD-10-CM | POA: Diagnosis not present

## 2023-05-19 NOTE — Progress Notes (Unsigned)
                Dezi Schaner, LCSW 

## 2023-05-19 NOTE — Progress Notes (Unsigned)
[facility] Counselor/Therapist Progress Note  Patient ID: Diane [name], MRN: [id number],    Date: 05/19/2023  Time Spent: 12:32pm - 1:18pm : 46 minutes  Treatment Type: Individual Therapy  Reported Symptoms: irritability  Mental Status Exam: Appearance:  Neat and Well Groomed     Behavior: Appropriate  Motor: Normal  Speech/Language:  Clear and Coherent  Affect: Tearful  Mood: irritable  Thought process: normal  Thought content:   WNL  Sensory/Perceptual disturbances:   WNL  Orientation: oriented to person, place, and situation  Attention: Good  Concentration: Good  Memory: WNL  Fund of knowledge:  Good  Insight:   Good  Judgment:  Good  Impulse Control: Good   Risk Assessment: Danger to Self:  No Patient denied current suicidal ideation  Self-injurious Behavior: No Danger to Others: No Patient denied current homicidal ideation Duty to Warn:no Physical Aggression / Violence:No  Access to Firearms a concern: No  Gang Involvement:No   Subjective: Patient stated, "its been ok" in response to events since last session. Patient reported her husband's grandmother passed away recently and patient reported grandmother's passing triggered thoughts/feelings associated with patient's grief. Patient stated, "I have been feeling better lately, over the last few days". Patient stated, "feeling irritable today".  Patient stated, "my feelings, my grief, my trauma, my experiences are the bar" in reference to how patient views/compares other situations since losing her father. Patient reported the family's relationship with her husband's grandmother has been "non existent" and reported she is resentful that husband's grandmother did not want to pursue a relationship. Patient reported her grandparents passed away when patient was younger. Patient stated, "I just kind of feel apathetic about it" in response to husband's grandmother's passing. Patient reported during a recent  conversation with husband's mother patient's thoughts were, "I was in the car on speaker phone listening to paramedics working on my dad". Patient stated, "its frustration and resentment",  "wishing the circumstances around the way we lost my dad to be different", "wanting something peaceful and closure".    Interventions: Cognitive Behavioral Therapy and supportive therapy . Clinician conducted session via caregility video from clinician's home office. Patient provided verbal consent to proceed with telehealth session and is aware of limitations of telephone or video visits. Patient participated in session from patient's home. Reviewed events since last session. Assessed patient's mood since last session and current mood. Provided supportive therapy and active listening as patient discussed the recent passing of patient's husband's grandmother. Assisted patient in exploring and identifying thoughts/feelings triggered by husband's grandmother's passing. Provided psycho education related to positive self talk, cognitive restructuring, and use of socratic questions. Reviewed patient's progress/goals for therapy, and extended patient's short term goals an additional 6 months. Clinician requested for homework patient continue thought record and use questions to challenge cognitive distortions/negative thoughts.    Collaboration of Care: not required at this time   Diagnosis:  Generalized anxiety disorder     Plan: Patient is to utilize [employer] Therapy, thought re-framing, behavioral activation, mindfulness and coping strategies to decrease symptoms associated with their diagnosis. Frequency: bi-weekly  Modality: individual      Long-term goal:   Reduce overall level, frequency, and intensity of the feelings of anxiety as evidenced by decreased "worrying about worse case scenario situations", irritability, overstimulation by noises in the environment, "my mind just starts spiraling", nausea  when feeling anxious, shaky feeling, feeling on edge, lack of motivation when anxious, difficulty concentrating, fatigue and increased sleep when feeling anxious  from 6 to 7 days/week to 0 to 1 days/week per patient report for at least 3 consecutive months. Target Date: 11/04/23  Progress: progressing    Short-term goal:  Develop and implement mindfulness strategies to increase patient being present when spending time with family and other activities Target Date: 11/04/23  Progress: progressing    Increase and maintain daily self care strategies, such as, drinking water, eating 3 healthy meals per day, healthy sleep habits, exercising, and work/life balance Target Date: 11/04/23  Progress: progressing    Develop and implement coping strategies and effective stress management strategies to decrease feelings of irritability and negative responses to stress Target Date: 11/04/23  Progress: progressing    Identify, challenge, and replace negative thought patterns that contribute to feelings of guilt and anxiety with positive thoughts and beliefs per patient's report Target Date: 11/04/23  Progress: progressing    Begin healthy grieving process Target Date: 11/04/23  Progress: progressing    Diane Barthel, LCSW

## 2023-05-29 ENCOUNTER — Ambulatory Visit: Payer: BC Managed Care – PPO | Admitting: Clinical

## 2023-05-29 DIAGNOSIS — F411 Generalized anxiety disorder: Secondary | ICD-10-CM | POA: Diagnosis not present

## 2023-05-29 NOTE — Progress Notes (Signed)
                Dezi Schaner, LCSW 

## 2023-05-29 NOTE — Progress Notes (Signed)
[facility] Counselor/Therapist Progress Note  Patient ID: Diane [name], MRN: [id number],    Date: 05/29/2023  Time Spent: 9:36am - 10:31am : 55 minutes   Treatment Type: Individual Therapy  Reported Symptoms: Patient reported feeling overwhelmed  Mental Status Exam: Appearance:  Neat and Well Groomed     Behavior: Appropriate  Motor: Normal  Speech/Language:  Clear and Coherent  Affect: Tearful  Mood: overwhelmed  Thought process: normal  Thought content:   WNL  Sensory/Perceptual disturbances:   WNL  Orientation: oriented to person, place, and situation  Attention: Good  Concentration: Good  Memory: WNL  Fund of knowledge:  Good  Insight:   Good  Judgment:  Good  Impulse Control: Good   Risk Assessment: Danger to Self:  No Self-injurious Behavior: No Danger to Others: No Duty to Warn:no Physical Aggression / Violence:No  Access to Firearms a concern: No  Gang Involvement:No   Subjective: Patient stated, "its been a ride" in response to events since last session.  Patient reported her husband dislocated his shoulder on thanksgiving, her son is currently sick and the anniversary of patient's dad's passing is this weekend. Patient reported feeling overwhelmed with her son being home sick this week and managing patient's business orders. Patient reported she is concerned about decorating for the holidays due to husband's physical limitations. Patient stated, "trying to re balance it all" is overwhelming. Patient stated, "there's this pressure that already exists" in reference to the holiday season. Patient stated, "everything got put down two years ago when we lost my dad" and stated, "I don't want to put the things down this year". Patient stated, "my priority is spending time with my kids". Patient stated, "I feel better today than I have earlier this week". Patient reported patient/family are going to patient's mother's home this weekend and patient/family  are going  to the cemetary and attending mass in honor of her father. Patient reported patient's uncle asked to attend the events this weekend honoring patient's father. Patient reported she "did not respond well" to her uncle's inquiry. Patient stated, "I tried really hard to use those socratic questions" in response to thoughts triggered by uncle's inquiry. Patient stated, "I'm just feeling self centered I don't want to deal with your (uncle) grief, I'm trying to deal with mine". Patient reported feeling angry that her father passed away.  Patient reported she feels her uncles efforts to communicate are not genuine. Patient reported concern that her mother is experiencing depressive symptoms and isolates herself.   Interventions: Cognitive Behavioral Therapy. Clinician conducted session via caregility video from clinician's office at [facility]. Patient provided verbal consent to proceed with telehealth session and is aware of limitations of telephone or video visits. Patient participated in session from patient's home. Reviewed events since last session. Discussed current stressors and the impact on patient's mood. Assisted patient in exploring and identifying triggers for feeling overwhelmed. Challenged thoughts and assisted patient in practicing challenging and reframing thoughts associated with decorating. Assessed patient's mood. Assisted patient in examining patient's thoughts/feelings related to uncle's request to visit and patient's response. Provided psycho education related to emotions associated with grief. Provided psycho education related to depression and providing support to an individual experiencing grief/depression. Discussed coping strategies, such as, journaling positive thoughts, memories, feelings regarding patient's father. Clinician requested for homework patient continue thought record and use questions to challenge cognitive distortions/negative thoughts.    Collaboration of  Care: not required at this time   Diagnosis:  Generalized anxiety disorder     Plan: Patient is to utilize [employer] Therapy, thought re-framing, behavioral activation, mindfulness and coping strategies to decrease symptoms associated with their diagnosis. Frequency: bi-weekly  Modality: individual      Long-term goal:   Reduce overall level, frequency, and intensity of the feelings of anxiety as evidenced by decreased "worrying about worse case scenario situations", irritability, overstimulation by noises in the environment, "my mind just starts spiraling", nausea when feeling anxious, shaky feeling, feeling on edge, lack of motivation when anxious, difficulty concentrating, fatigue and increased sleep when feeling anxious from 6 to 7 days/week to 0 to 1 days/week per patient report for at least 3 consecutive months. Target Date: 11/04/23  Progress: progressing    Short-term goal:  Develop and implement mindfulness strategies to increase patient being present when spending time with family and other activities Target Date: 11/04/23  Progress: progressing    Increase and maintain daily self care strategies, such as, drinking water, eating 3 healthy meals per day, healthy sleep habits, exercising, and work/life balance Target Date: 11/04/23  Progress: progressing    Develop and implement coping strategies and effective stress management strategies to decrease feelings of irritability and negative responses to stress Target Date: 11/04/23  Progress: progressing    Identify, challenge, and replace negative thought patterns that contribute to feelings of guilt and anxiety with positive thoughts and beliefs per patient's report Target Date: 11/04/23  Progress: progressing    Begin healthy grieving process Target Date: 11/04/23  Progress: progressing    Doree Barthel, LCSW

## 2023-06-12 ENCOUNTER — Ambulatory Visit: Payer: BC Managed Care – PPO | Admitting: Clinical

## 2023-06-27 ENCOUNTER — Ambulatory Visit (INDEPENDENT_AMBULATORY_CARE_PROVIDER_SITE_OTHER): Payer: 59 | Admitting: Clinical

## 2023-06-27 DIAGNOSIS — F411 Generalized anxiety disorder: Secondary | ICD-10-CM | POA: Diagnosis not present

## 2023-06-27 NOTE — Progress Notes (Signed)
   Doree Barthel, LCSW

## 2023-06-27 NOTE — Progress Notes (Signed)
 [facility] Counselor/Therapist Progress Note  Patient ID: Diane [name], MRN: [id number],    Date: 06/27/2023  Time Spent: 10:33am - 11:26am : 53 minutes   Treatment Type: Individual Therapy  Reported Symptoms: tearful, sad  Mental Status Exam: Appearance:  Neat and Well Groomed     Behavior: Appropriate  Motor: Normal  Speech/Language:  Clear and Coherent  Affect: Tearful  Mood: sad  Thought process: normal  Thought content:   WNL  Sensory/Perceptual disturbances:   WNL  Orientation: oriented to person, place, and situation  Attention: Good  Concentration: Good  Memory: WNL  Fund of knowledge:  Good  Insight:   Good  Judgment:  Good  Impulse Control: Good   Risk Assessment: Danger to Self:  No Patient denied current suicidal ideation  Self-injurious Behavior: No Danger to Others: No Patient denied current homicidal ideation Duty to Warn:no Physical Aggression / Violence:No  Access to Firearms a concern: No  Gang Involvement:No   Subjective: Patient stated, its been ok, its been alright in response to events since last session. Patient reported since patient's father passed away patient's mother has not wanted to travel to patient's home on the day before Christmas as she has during previous holidays. Patient reported patient's mother prefers to stay at her mother's home and isolates herself. Patient stated, I often just feel like I want to do what ever she wants. Patient stated, I worry that I'm doing that at the expense of myself, my husband, and my kids. Patient stated, I'm trying really hard to try to find the balance in supporting her. Patient stated, if my mom could pitch a tent and sit next to my dad's grave all day that's what she wants. Patient stated, what she (mother) wants is to sit in her chair and be alone. Patient stated, I worry it's the depression that she wants to be alone. Patient reported she does not feel her mother is  suicidal and has not observed concerning behaviors/statements. Patient stated, when I'm having fun, when I'm enjoying things, when I'm happy, I think that's great but your dad's dead. Patient reported her mother becomes irritated when patient/siblings discuss patient's mother seeking professional support. Patient stated,  I just want to be able to fix it and I can't. Patient stated, I've been feeling ok, I've been good in response to patient's mood since last session. Patient stated, we had a nice holiday and everything was fine.   Interventions: Cognitive Behavioral Therapy. Clinician conducted session via caregility video from clinician's home office. Patient provided verbal consent to proceed with telehealth session and is aware of limitations of telephone or video visits. Patient participated in session from patient's home. Discussed patient's concerns and feelings related to patient's mother's response to patient's father's passing and patient providing support to her mother. Provided psycho education related to providing support to an individual experiencing grief/depression. Provided psycho education related to grief and depression. Discussed resources for patient to utilize if patient becomes concerned for her mother's safety. Discussed patient initiating a conversation with her mother to discuss patient's concerns. Provided reflective listening and validation. Assessed patient's mood since last session and current mood. Clinician requested for homework patient continue thought record and use questions to challenge cognitive distortions/negative thoughts.    Collaboration of Care: not required at this time   Diagnosis:  Generalized anxiety disorder     Plan: Patient is to utilize [employer] Therapy, thought re-framing, behavioral activation, mindfulness and coping strategies to decrease symptoms associated  with their diagnosis. Frequency: bi-weekly  Modality: individual       Long-term goal:   Reduce overall level, frequency, and intensity of the feelings of anxiety as evidenced by decreased worrying about worse case scenario situations, irritability, overstimulation by noises in the environment, my mind just starts spiraling, nausea when feeling anxious, shaky feeling, feeling on edge, lack of motivation when anxious, difficulty concentrating, fatigue and increased sleep when feeling anxious from 6 to 7 days/week to 0 to 1 days/week per patient report for at least 3 consecutive months. Target Date: 11/04/23  Progress: progressing    Short-term goal:  Develop and implement mindfulness strategies to increase patient being present when spending time with family and other activities Target Date: 11/04/23  Progress: progressing    Increase and maintain daily self care strategies, such as, drinking water, eating 3 healthy meals per day, healthy sleep habits, exercising, and work/life balance Target Date: 11/04/23  Progress: progressing    Develop and implement coping strategies and effective stress management strategies to decrease feelings of irritability and negative responses to stress Target Date: 11/04/23  Progress: progressing    Identify, challenge, and replace negative thought patterns that contribute to feelings of guilt and anxiety with positive thoughts and beliefs per patient's report Target Date: 11/04/23  Progress: progressing    Begin healthy grieving process Target Date: 11/04/23  Progress: progressing     Darice Seats, LCSW

## 2023-07-11 ENCOUNTER — Ambulatory Visit (INDEPENDENT_AMBULATORY_CARE_PROVIDER_SITE_OTHER): Payer: 59 | Admitting: Clinical

## 2023-07-11 DIAGNOSIS — F411 Generalized anxiety disorder: Secondary | ICD-10-CM
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
## 2023-07-11 NOTE — Progress Notes (Signed)
                Dezi Schaner, LCSW 

## 2023-07-13 ENCOUNTER — Other Ambulatory Visit: Payer: Self-pay | Admitting: Obstetrics & Gynecology

## 2023-07-21 ENCOUNTER — Other Ambulatory Visit: Payer: Self-pay

## 2023-07-21 DIAGNOSIS — Z3041 Encounter for surveillance of contraceptive pills: Secondary | ICD-10-CM

## 2023-07-21 MED ORDER — NORGESTIMATE-ETH ESTRADIOL 0.25-35 MG-MCG PO TABS
1.0000 | ORAL_TABLET | Freq: Every day | ORAL | 0 refills | Status: DC
Start: 2023-07-21 — End: 2023-07-28

## 2023-07-23 ENCOUNTER — Ambulatory Visit (INDEPENDENT_AMBULATORY_CARE_PROVIDER_SITE_OTHER): Payer: 59 | Admitting: Clinical

## 2023-07-23 DIAGNOSIS — F411 Generalized anxiety disorder: Secondary | ICD-10-CM | POA: Diagnosis not present

## 2023-07-23 NOTE — Progress Notes (Signed)
Diane Barthel, LCSW

## 2023-07-23 NOTE — Progress Notes (Signed)
[facility] Counselor/Therapist Progress Note  Patient ID: Diane [name], MRN: [id number],    Date: 07/23/2023  Time Spent: 12:34pm - 1:26pm : 52 minutes   Treatment Type: Individual Therapy  Reported Symptoms: Patient reported feeling anxious recently  Mental Status Exam: Appearance:  Neat and Well Groomed     Behavior: Appropriate  Motor: Normal  Speech/Language:  Clear and Coherent and Normal Rate  Affect: Tearful  Mood: sad when discussing patient's father  Thought process: normal  Thought content:   WNL  Sensory/Perceptual disturbances:   WNL  Orientation: oriented to person, place, and situation  Attention: Good  Concentration: Good  Memory: WNL  Fund of knowledge:  Good  Insight:   Good  Judgment:  Good  Impulse Control: Good   Risk Assessment: Danger to Self:  No Patient denied current suicidal ideation  Self-injurious Behavior: No Danger to Others: No Patient denied current homicidal ideation Duty to Warn:no Physical Aggression / Violence:No  Access to Firearms a concern: No  Gang Involvement:No   Subjective: Patient stated, "they've been ok for the most part, good" in response to events since last session. Patient stated, "yesterday was a rough day and the day before was too". Patient reported yesterday morning she experienced the thoughts, "what if the whole family gets norovirus, who's going to take care of you" and reported feeling anxious in response to the thoughts.  Patient reported when feeling anxious, patient noted a difference in patient's ability to complete tasks and stated, "I have a hard time focusing on things I'm doing". Patient reported "feeling frozen" when anxious. Patient reported the day before yesterday patient "felt off and anxious". Patient reported she practiced challenging the thoughts and was unable identify evidence to support the thoughts/worry. Patient stated, "I still felt pretty anxious about it", "I did finally get  myself to the point I was distracted and wasn't thinking about it as much". Patient stated, "me being sick and other people being around me being sick" are triggers for anxiety. Patient stated, "I feel much better today". Patient stated, "its the anticipation of that (illness) happening that's worse", "or the fear that its going to happen". Patient stated, "its better" in response to challenging thoughts during session. Patient reported she experiences difficulty challenging thoughts associated with patient's father's passing.  Patient reported she is not able to identify evidence to support thoughts associated with father's passing and illness. Patient stated, "its just easier to apply those thoughts to other catastrophizing situations".   Interventions: Cognitive Behavioral Therapy. Clinician conducted session via caregility video from clinician's office at [facility]. Patient provided verbal consent to proceed with telehealth session and is aware of limitations of telephone or video visits. Patient participated in session from patient's home. Reviewed events since last session. Discussed recent symptoms of anxiety and the impact on patient's activities. Discussed patient's implementation of challenging cognitive distortions/worry. Discussed triggers for symptoms of anxiety. Provided psycho education related to challenging catastrophizing thoughts. Assisted patient in practicing catastrophizing thoughts related to the norovirus, patient's father's passing and worry associated with illness. Provided psycho education related to grief and emotions associated with grief. Assessed patient's mood. Clinician requested for homework patient continue thought record and use questions to challenge cognitive distortions/negative thoughts.  Collaboration of Care: not required at this time   Diagnosis:  Generalized anxiety disorder     Plan: Patient is to utilize [employer] Therapy, thought  re-framing, behavioral activation, mindfulness and coping strategies to decrease symptoms associated with their  diagnosis. Frequency: bi-weekly  Modality: individual      Long-term goal:   Reduce overall level, frequency, and intensity of the feelings of anxiety as evidenced by decreased "worrying about worse case scenario situations", irritability, overstimulation by noises in the environment, "my mind just starts spiraling", nausea when feeling anxious, shaky feeling, feeling on edge, lack of motivation when anxious, difficulty concentrating, fatigue and increased sleep when feeling anxious from 6 to 7 days/week to 0 to 1 days/week per patient report for at least 3 consecutive months. Target Date: 11/04/23  Progress: progressing    Short-term goal:  Develop and implement mindfulness strategies to increase patient being present when spending time with family and other activities Target Date: 11/04/23  Progress: progressing    Increase and maintain daily self care strategies, such as, drinking water, eating 3 healthy meals per day, healthy sleep habits, exercising, and work/life balance Target Date: 11/04/23  Progress: progressing    Develop and implement coping strategies and effective stress management strategies to decrease feelings of irritability and negative responses to stress Target Date: 11/04/23  Progress: progressing    Identify, challenge, and replace negative thought patterns that contribute to feelings of guilt and anxiety with positive thoughts and beliefs per patient's report Target Date: 11/04/23  Progress: progressing    Begin healthy grieving process Target Date: 11/04/23  Progress: progressing       Doree Barthel, LCSW

## 2023-07-25 ENCOUNTER — Ambulatory Visit: Payer: 59 | Admitting: Clinical

## 2023-07-28 ENCOUNTER — Encounter: Payer: Self-pay | Admitting: Obstetrics

## 2023-07-28 ENCOUNTER — Ambulatory Visit (INDEPENDENT_AMBULATORY_CARE_PROVIDER_SITE_OTHER): Payer: 59 | Admitting: Obstetrics

## 2023-07-28 VITALS — BP 116/72 | HR 86 | Ht 65.0 in | Wt 216.0 lb

## 2023-07-28 DIAGNOSIS — Z01419 Encounter for gynecological examination (general) (routine) without abnormal findings: Secondary | ICD-10-CM

## 2023-07-28 DIAGNOSIS — Z3041 Encounter for surveillance of contraceptive pills: Secondary | ICD-10-CM

## 2023-07-28 MED ORDER — NORGESTIMATE-ETH ESTRADIOL 0.25-35 MG-MCG PO TABS
1.0000 | ORAL_TABLET | Freq: Every day | ORAL | 3 refills | Status: DC
Start: 2023-07-28 — End: 2024-03-26

## 2023-07-28 NOTE — Progress Notes (Signed)
GYNECOLOGY: ANNUAL EXAM   Subjective:    PCP: [name], NP GIAVANNI ZEITLIN is a 38 y.o. female [phone] who presents for annual wellness visit. Also requesting contraception refill today, questions about long-term use. Not ready to switch methods.   Well Woman Visit:  GYN HISTORY:  Patient's last menstrual period was 07/09/2023.     Menstrual History: OB History     Gravida  2   Para  2   Term  2   Preterm      AB      Living  2      SAB      IAB      Ectopic      Multiple      Live Births  2           Menarche age: 34 Patient's last menstrual period was 07/09/2023. Period Cycle (Days): 30 Period Duration (Days): 5 Period Pattern: (!) Irregular Menstrual Flow: Heavy, Light Menstrual Control: Tampon, Thin pad Menstrual Control Change Freq (Hours): 2-6 Dysmenorrhea: (!) Mild Dysmenorrhea Symptoms: Cramping   Intermenstrual bleeding, spotting, or discharge? no Urinary incontinence? no  Sexually active: no Number of sexual partners: 1 Gender of sexual Partners: male Social History   Substance and Sexual Activity  Sexual Activity Yes   Birth control/protection: Pill   Contraceptive methods: oral contraceptives (estrogen/progesterone) Dyspareunia? no STI history: no STI/HIV testing or immunizations needed? No.   Health Maintenance: -Last pap: was normal 07/05/21 nilm hpv neg --> Any abnormals: no -Last mammogram:  --> Any abnormals? N/a -Last colon cancer screen: n/a / Type: n/a -Last DEXA scan: n/a [facility] Breast / Colon / Cervical cancer: yes -Vaccines:  Immunization History  Administered Date(s) Administered   Influenza,inj,Quad PF,6+ Mos 04/16/2017, 05/14/2018, 03/18/2020, 05/03/2021   Influenza-Unspecified 04/22/2019   Moderna Sars-Covid-2 Vaccination 06/20/2020   PFIZER(Purple Top)SARS-COV-2 Vaccination 09/16/2019, 10/14/2019   PPD Test 09/05/2020   Tdap 04/16/2017   Last Tdap: utd / Flu: declined  / COVID:  completed / Gardasil: never had  / Shingles (50+): n/a / PCV20: n/a -Hep C screen: completed -Last lipid / glucose screening: utd  > Exercise: housecleaning, moderately active > Dietary Supplements: Folate: No;  Calcium: No}; Vitamin D: No > Body mass index is 35.94 kg/m.  > Recent dental visit Yes.   > Seat Belt Use: Yes.   > Texting and driving? No. > Guns in the house No. > Recreational or other drug use: denied.   Social History   Tobacco Use   Smoking status: Never   Smokeless tobacco: Never  Substance Use Topics   Alcohol use: Yes    Comment: occasional   Occupation: home maker     Lives with: husband and 2 kids     PHQ-2 Score: In last two weeks, how often have you felt: Little interest or pleasure in doing things: Not at all (0) Feeling down, depressed or hopeless: Not at all (0) Score: 0  GAD-2 Over the last 2 weeks, how often have you been bothered by the following problems? Feeling nervous, anxious or on edge: Several days (+1) Not being able to stop or control worrying: Several days (+1)} Score:2 _________________________________________________________  Current Outpatient Medications  Medication Sig Dispense Refill   albuterol (PROAIR HFA) 108 (90 Base) MCG/ACT inhaler Inhale 2 puffs into the lungs every 6 (six) hours as needed for wheezing or shortness of breath. 1 each 0   norgestimate-ethinyl estradiol (ESTARYLLA) 0.25-35 MG-MCG tablet Take 1 tablet  by mouth daily. 84 tablet 0   Accu-Chek Softclix Lancets lancets 1 each by Other route 4 (four) times daily. (Patient not taking: Reported on 07/28/2023) 100 each 12   diltiazem (CARDIZEM) 30 MG tablet Take 1 tablet (30 mg total) by mouth 2 (two) times daily as needed. (Patient not taking: Reported on 07/28/2023) 60 tablet 11   glucose blood (ACCU-CHEK SMARTVIEW) test strip Use as instructed to check blood sugars (Patient not taking: Reported on 07/28/2023) 100 each 12   predniSONE (DELTASONE) 20 MG tablet Take 2  tablets (40 mg total) by mouth daily with breakfast. (Patient not taking: Reported on 07/28/2023) 10 tablet 0   Vitamin D, Ergocalciferol, (DRISDOL) 1.25 MG (50000 UNIT) CAPS capsule Take 1 capsule (50,000 Units total) by mouth every 7 (seven) days. (Patient not taking: Reported on 07/28/2023) 10 capsule 0   No current facility-administered medications for this visit.   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 02/2022 [employer]: predominantly sinus rhythm @ 85 (53-162). 1 symptomatic run of SVT @ 162 x 16 beats. Rare PACs.   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   Past Surgical History:  Procedure Laterality Date   CHOLECYSTECTOMY N/A 11/28/2017   Procedure: LAPAROSCOPIC CHOLECYSTECTOMY WITH INTRAOPERATIVE CHOLANGIOGRAM;  Surgeon: Luretha Murphy, MD;  Location: [facility] ORS;  Service: General;  Laterality: N/A;   LAPAROSCOPY  2009   nsvd  2018,2016   x 2    Review Of Systems  Constitutional: Denied constitutional symptoms, night sweats, recent illness, fatigue, fever, insomnia and weight loss.  Eyes: Denied eye symptoms, eye pain, photophobia, vision change and visual disturbance.  Ears/Nose/Throat/Neck: Denied ear, nose, throat or neck symptoms, hearing loss, nasal discharge, sinus congestion and sore throat.  Cardiovascular: Denied cardiovascular symptoms, arrhythmia, chest pain/pressure, edema, exercise intolerance, orthopnea and palpitations.  Respiratory: Denied pulmonary  symptoms, asthma, pleuritic pain, productive sputum, cough, dyspnea and wheezing.  Gastrointestinal: Denied, gastro-esophageal reflux, melena, nausea and vomiting.  Genitourinary: Denied genitourinary symptoms including symptomatic vaginal discharge, pelvic relaxation issues, and urinary complaints.  Musculoskeletal: Denied musculoskeletal symptoms, stiffness, swelling, muscle weakness and myalgia.  Dermatologic: Denied dermatology symptoms, rash and scar.  Neurologic: Denied neurology symptoms, dizziness, headache, neck pain and syncope.  Psychiatric: Denied psychiatric symptoms, anxiety and depression.  Endocrine: Denied endocrine symptoms including hot flashes and night sweats.      Objective:    BP 116/72   Pulse 86   Ht 5\' 5"  (1.651 m)   Wt 216 lb (98 kg)   LMP 07/09/2023   BMI 35.94 kg/m   Constitutional: Well-developed, well-nourished female in no acute distress Neurological: Alert and oriented to person, place, and time Psychiatric: Mood and affect appropriate Skin: No rashes or lesions Neck: Supple without masses. Trachea is midline.Thyroid is normal size without masses Lymphatics: No cervical, axillary, supraclavicular, or inguinal adenopathy noted Respiratory: Clear to auscultation bilaterally. Good air movement with normal work of  breathing. Cardiovascular: Regular rate and rhythm. Extremities grossly normal, nontender with no edema; pulses regular Gastrointestinal: Soft, nontender, nondistended. No masses or hernias appreciated. No hepatosplenomegaly. No fluid wave. No rebound or guarding. Breast Exam: normal appearance, no masses or tenderness, Inspection negative, No nipple retraction or dimpling, No nipple discharge or bleeding, No axillary or supraclavicular adenopathy, Normal to palpation without dominant masses Genitourinary:         External Genitalia: Normal female genitalia    Vagina: Normal mucosa, no lesions.    Cervix: No lesions, normal size and  consistency; no cervical motion tenderness; non-friable; Pap not obtained.    Uterus: Normal size and contour; smooth, mobile, NT. Adnexae: Non-palpable and non-tender Perineum/Anus: No lesions Rectal: deferred    Assessment/Plan:    DONICE ALPERIN is a 38 y.o. female G2P2002 with normal well-woman gynecologic exam.  -Screenings:  Pap: cotesting due 06/2026 Mammogram: start at 40yo Labs: PCP GAD/PHQ-2 = 2, discussed coping techniques; follow up with PCP if worsens or develops concern -Contraception: COCs, refilled x 12 mos today; discussed small increase in risk of estrogen-related cancers, but decrease in risk of ovarian/endometrial.  -Vaccines: UTD, Gardasil not completed, will consider.  -Healthy lifestyle modifications discussed: multivitamin, diet, exercise, sunscreen, tobacco and alcohol use. Emphasized importance of regular physical activity.  -Folate recommendation reviewed.  -All questions answered to patient's satisfaction.   Return in about 1 year (around 07/27/2024) for annual .    Julieanne Manson, DO [facility] at [facility]

## 2023-07-28 NOTE — Patient Instructions (Signed)

## 2023-08-04 ENCOUNTER — Ambulatory Visit: Payer: 59 | Admitting: Clinical

## 2023-08-04 DIAGNOSIS — F411 Generalized anxiety disorder: Secondary | ICD-10-CM | POA: Diagnosis not present

## 2023-08-04 NOTE — Progress Notes (Signed)
 [facility] Counselor/Therapist Progress Note  Patient ID: LINKYN DENIKE, MRN: [id number],    Date: 08/04/2023  Time Spent: 9:33am - 10:21am : 48 minutes   Treatment Type: Individual Therapy  Reported Symptoms: Patient reported worry and difficulty sleeping  Mental Status Exam: Appearance:  Neat and Well Groomed     Behavior: Appropriate  Motor: Normal  Speech/Language:  Clear and Coherent and Normal Rate  Affect: Appropriate  Mood: normal  Thought process: normal  Thought content:   WNL  Sensory/Perceptual disturbances:   WNL  Orientation: oriented to person, place, and situation  Attention: Good  Concentration: Good  Memory: WNL  Fund of knowledge:  Good  Insight:   Good  Judgment:  Good  Impulse Control: Good   Risk Assessment: Danger to Self:  No Patient denied current suicidal ideation  Self-injurious Behavior: No Danger to Others: No Patient denied current homicidal ideation Duty to Warn:no Physical Aggression / Violence:No  Access to Firearms a concern: No  Gang Involvement:No   Subjective: Patient stated, "they've been pretty good, past couple of days have been anxious I guess" in response to events since last session. Patient reported she held a cookie decorating event on Saturday and reported feeling "very flustered". Patient stated, "I had a hard time getting back to calm". Patient reported her husband has been experiencing acid reflux at night and reported difficulty returning to sleep due to worry about husband's health. Patient reported she utilized challenging questions in response to worry and stated, "it was not happening". Patient stated, "I panicked and it spiraled" in response to worry about husband's health. Patient stated, "I think my fear with any medical things is that this is a symptom of something much bigger that is going on and you're going to miss it and write it off as some significant thing and its going to be too late later".  Patient reported the fear of missing a medical condition/issue started when patient's children were born. Patient stated,  "worrying something terrible was going to happen to them" in reference to when patient's children were infants. Patient stated, "It became confirmation of this fear" in reference to brother's brain tumor.  Patient reported feelings of anxiety have subsided and patient reported "a little unsettled feeling", "I think I'm feeling ok today" in response to patient's current mood. Patient reported the beach and the hammock in patient's back yard are places of serenity. Patient stated, "I'll absolutely try" in response to mindfulness exercises. Patient stated, "I did try to", " I have a hard time when I do start spiraling keeping my focus on something" in reference to patient's implementation of challenging cognitive distortions. Patient stated, "I did try, it did not help in that moment". Patient reported she used reading to distract herself in response to anxiety and difficulty returning to sleep.   Interventions: Cognitive Behavioral Therapy and mindfulness. Clinician conducted session via caregility video from clinician's home office. Patient provided verbal consent to proceed with telehealth session and is aware of limitations of telephone or video visits. Patient participated in session from patient's home. Reviewed events since last session. Assisted patient in exploring and identifying thoughts/distortions associated with husband's symptoms of acid reflux. Assisted patient in examining patient's fear of missing a medical issue and identifying a trigger for the feeling of fear. Provided psycho education related to the cognitive distortion of over generalization. Provided psycho education related to mindfulness, deep breathing, and imagery exercise. Reviewed patient's implementation of challenging cognitive distortions and the outcome.  Clinician requested for homework patient continue thought  record and use questions to challenge cognitive distortions/negative thoughts.   Collaboration of Care: not required at this time   Diagnosis:  Generalized anxiety disorder     Plan: Patient is to utilize [employer] Therapy, thought re-framing, behavioral activation, mindfulness and coping strategies to decrease symptoms associated with their diagnosis. Frequency: bi-weekly  Modality: individual      Long-term goal:   Reduce overall level, frequency, and intensity of the feelings of anxiety as evidenced by decreased "worrying about worse case scenario situations", irritability, overstimulation by noises in the environment, "my mind just starts spiraling", nausea when feeling anxious, shaky feeling, feeling on edge, lack of motivation when anxious, difficulty concentrating, fatigue and increased sleep when feeling anxious from 6 to 7 days/week to 0 to 1 days/week per patient report for at least 3 consecutive months. Target Date: 11/04/23  Progress: progressing    Short-term goal:  Develop and implement mindfulness strategies to increase patient being present when spending time with family and other activities Target Date: 11/04/23  Progress: progressing    Increase and maintain daily self care strategies, such as, drinking water, eating 3 healthy meals per day, healthy sleep habits, exercising, and work/life balance Target Date: 11/04/23  Progress: progressing    Develop and implement coping strategies and effective stress management strategies to decrease feelings of irritability and negative responses to stress Target Date: 11/04/23  Progress: progressing    Identify, challenge, and replace negative thought patterns that contribute to feelings of guilt and anxiety with positive thoughts and beliefs per patient's report Target Date: 11/04/23  Progress: progressing    Begin healthy grieving process Target Date: 11/04/23  Progress: progressing     Burlene Carpen, LCSW

## 2023-08-04 NOTE — Progress Notes (Signed)
   Diane Barthel, LCSW

## 2023-08-12 ENCOUNTER — Encounter: Payer: Self-pay | Admitting: Obstetrics

## 2023-08-18 ENCOUNTER — Ambulatory Visit: Payer: 59 | Admitting: Clinical

## 2023-08-20 ENCOUNTER — Ambulatory Visit: Payer: 59 | Admitting: Psychiatry

## 2023-09-02 ENCOUNTER — Ambulatory Visit: Payer: 59 | Admitting: Clinical

## 2023-09-02 DIAGNOSIS — F411 Generalized anxiety disorder: Secondary | ICD-10-CM

## 2023-09-02 NOTE — Progress Notes (Signed)
   Diane Barthel, LCSW

## 2023-09-02 NOTE — Progress Notes (Signed)
 [facility] Counselor/Therapist Progress Note  Patient ID: Diane [name], MRN: [id number],    Date: 09/02/2023  Time Spent: 8:35am - 9:22am : 47 minutes   Treatment Type: Individual Therapy  Reported Symptoms: anxiety  Mental Status Exam: Appearance:  Neat and Well Groomed     Behavior: Appropriate  Motor: Normal  Speech/Language:  Clear and Coherent and Normal Rate  Affect: Tearful  Mood: anxious  Thought process: normal  Thought content:   WNL  Sensory/Perceptual disturbances:   WNL  Orientation: oriented to person, place, and situation  Attention: Good  Concentration: Good  Memory: WNL  Fund of knowledge:  Good  Insight:   Good  Judgment:  Good  Impulse Control: Good   Risk Assessment: Danger to Self:  No Patient denied current suicidal ideation  Self-injurious Behavior: No Danger to Others: No Patient denied current homicidal ideation Duty to Warn:no Physical Aggression / Violence:No  Access to Firearms a concern: No  Gang Involvement:No   Subjective: Patient stated, "it's been ok" in response to events since last session. Patient stated, "I'm still in like a little bit of anxiety spiral from it" in reference to patient's children being sick. Patient reported patient's daughter vomited recently while patient/family was at church and then vomited once daughter returned home. Patient reported her son vomited several times as well. Patient stated,  "I just get very nervous and anxious when they're sick". Patient reported her children are now feeling well. Patient stated, "what I struggle with with anxiety is some times is this me inventing something and being anxious about it or is it an intuition". Patient stated, "I worry that the anxiety is some type of intuition" and stated, "It makes it harder to decatastrophize those things". Patient reported "I did really try to use the questions" in response to challenging cognitive distortions. Patient stated, "It  gave me something else to concentrate on in that moment" in reference to challenging cognitive distortions. Patient stated, "I do rationally know those things are true" in reference to evidence against thoughts/worry. Patient reported feelings of fear and stated, "that maybe it is something else, something worse and I'm going to miss it", "I become hyper attentive to any change". Patient stated, "I know for sure it (fear) got worse when I had my kids" in response to triggers for feelings of fear. Patient reported she utilized breathing exercises in response to recent feelings of anxiety/fear and stated, "it did help to calm me", "helps to alleviate the physical symptoms".   Interventions: Cognitive Behavioral Therapy. Clinician conducted session via caregility video from clinician's office at [facility]. Patient provided verbal consent to proceed with telehealth session and is aware of limitations of telephone or video visits. Patient participated in session from patient's home. Reviewed events since last session. Discussed recent feelings of anxiety/worry and triggers associated with recent feelings of anxiety/worry. Examined thoughts associated with feelings of anxiety and worry. Reviewed decatastophizing questions and discussed barriers to challenging cognitive distortions. Challenged statements/cognitive distortions presented during session and provided psycho education related to cognitive distortions, such as, disqualifying the positives, magical thinking, and fortune telling. Provided psycho education related to the cycle of anxiety. Explored triggers for fear of missing symptoms of an illness. Reviewed patient's implementation of mindfulness exercises and the outcome. Clinician requested for homework patient continue thought record and use questions to challenge cognitive distortions/negative thoughts.   Collaboration of Care: not required at this time   Diagnosis:  Generalized anxiety  disorder  Plan: Patient is to utilize [employer] Therapy, thought re-framing, behavioral activation, mindfulness and coping strategies to decrease symptoms associated with their diagnosis. Frequency: bi-weekly  Modality: individual      Long-term goal:   Reduce overall level, frequency, and intensity of the feelings of anxiety as evidenced by decreased "worrying about worse case scenario situations", irritability, overstimulation by noises in the environment, "my mind just starts spiraling", nausea when feeling anxious, shaky feeling, feeling on edge, lack of motivation when anxious, difficulty concentrating, fatigue and increased sleep when feeling anxious from 6 to 7 days/week to 0 to 1 days/week per patient report for at least 3 consecutive months. Target Date: 11/04/23  Progress: progressing    Short-term goal:  Develop and implement mindfulness strategies to increase patient being present when spending time with family and other activities Target Date: 11/04/23  Progress: progressing    Increase and maintain daily self care strategies, such as, drinking water, eating 3 healthy meals per day, healthy sleep habits, exercising, and work/life balance Target Date: 11/04/23  Progress: progressing    Develop and implement coping strategies and effective stress management strategies to decrease feelings of irritability and negative responses to stress Target Date: 11/04/23  Progress: progressing    Identify, challenge, and replace negative thought patterns that contribute to feelings of guilt and anxiety with positive thoughts and beliefs per patient's report Target Date: 11/04/23  Progress: progressing    Begin healthy grieving process Target Date: 11/04/23  Progress: progressing    Doree Barthel, LCSW

## 2023-09-18 ENCOUNTER — Ambulatory Visit: Admitting: Clinical

## 2023-09-18 DIAGNOSIS — F411 Generalized anxiety disorder: Secondary | ICD-10-CM

## 2023-09-18 NOTE — Progress Notes (Signed)
 [facility] Counselor/Therapist Progress Note  Patient ID: Diane [name], MRN: [id number],    Date: 09/18/2023  Time Spent: 9:36am - 10:31am : 55 minutes  Treatment Type: Individual Therapy  Reported Symptoms: worry  Mental Status Exam: Appearance:  Neat and Well Groomed     Behavior: Appropriate  Motor: Normal  Speech/Language:  Clear and Coherent and Normal Rate  Affect: Appropriate  Mood: normal  Thought process: normal  Thought content:   WNL  Sensory/Perceptual disturbances:   WNL  Orientation: oriented to person, place, and situation  Attention: Good  Concentration: Good  Memory: WNL  Fund of knowledge:  Good  Insight:   Good  Judgment:  Good  Impulse Control: Good   Risk Assessment: Danger to Self:  No Patient denied current suicidal ideation  Self-injurious Behavior: No Danger to Others: No Patient denied current homicidal ideation Duty to Warn:no Physical Aggression / Violence:No  Access to Firearms a concern: No  Gang Involvement:No   Subjective: Patient stated, "we've had a rough week" and reported patient's daughter and son have been home sick.  Patient stated, "I've been ok", "I'm tired". Patient stated, "the anticipation worry of someone getting sick is worse than when it hits". Patient stated, "I feel like it hasn't been my normal level of worry, I feel like I handled it better" and reported feeling she did not panic in response to illness. Patient stated, "I did not feel the panic/worry I have in the past". 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 Patient reported she feels refraining from going into children's room to check on them was a success. Patient reported she questions her son's response when patient asks son how he is feeling and questions if he is being truthful in his answer. Patient reported questioning son's response is a trigger for  feelings of panic. Patient stated, "I was pretty proud of myself for how I handled it this week".  Patient stated, "I did try to focus on what I knew needed to be handled", "there wasn't any build up" in response to recent illness. Patient stated, "Im feeling good today". Patient stated,  "I didn't even write anything down this week" in response to patient's thought record. Patient reported she has changed her expectations as it relates to stressors associated with the PTA and has been responding to the PTA president directly.  Patient reported she was summoned for jury duty last week and reported jury duty was stressful. Patient stated, "Lavenia Atlas been trying to prioritize myself" as it relates to managing patient's business and stated, "I feel less overwhelmed, less stress".    Interventions: Cognitive Behavioral Therapy. Clinician conducted session via caregility video from clinician's office at [facility]. Patient provided verbal consent to proceed with telehealth session and is aware of limitations of telephone or video visits. Patient participated in session from patient's home.  Reviewed events since last session and assessed for changes. Explored and identified thoughts/feelings triggered by illness and examined patient's behavioral response when children are experiencing an illness. Explored contributing factors to difference in patient's recent response when children were experiencing an illness. Explored and identified thoughts associated with worry. Reviewed patient's thought record. Assessed recent stressors and the status of stressors. Assisted patient in exploring and identifying additional triggers for anxiety. Provided psycho education related to stressors and mood. Clinician requested for homework patient continue thought record and use questions to challenge  cognitive distortions/negative thoughts.   Collaboration of Care: not required at this time   Diagnosis:  Generalized anxiety  disorder     Plan: Patient is to utilize [employer] Therapy, thought re-framing, behavioral activation, mindfulness and coping strategies to decrease symptoms associated with their diagnosis. Frequency: bi-weekly  Modality: individual      Long-term goal:   Reduce overall level, frequency, and intensity of the feelings of anxiety as evidenced by decreased "worrying about worse case scenario situations", irritability, overstimulation by noises in the environment, "my mind just starts spiraling", nausea when feeling anxious, shaky feeling, feeling on edge, lack of motivation when anxious, difficulty concentrating, fatigue and increased sleep when feeling anxious from 6 to 7 days/week to 0 to 1 days/week per patient report for at least 3 consecutive months. Target Date: 11/04/23  Progress: progressing    Short-term goal:  Develop and implement mindfulness strategies to increase patient being present when spending time with family and other activities Target Date: 11/04/23  Progress: progressing    Increase and maintain daily self care strategies, such as, drinking water, eating 3 healthy meals per day, healthy sleep habits, exercising, and work/life balance Target Date: 11/04/23  Progress: progressing    Develop and implement coping strategies and effective stress management strategies to decrease feelings of irritability and negative responses to stress Target Date: 11/04/23  Progress: progressing    Identify, challenge, and replace negative thought patterns that contribute to feelings of guilt and anxiety with positive thoughts and beliefs per patient's report Target Date: 11/04/23  Progress: progressing    Begin healthy grieving process Target Date: 11/04/23  Progress: progressing     Doree Barthel, LCSW

## 2023-09-18 NOTE — Progress Notes (Signed)
   Doree Barthel, LCSW

## 2023-10-06 ENCOUNTER — Ambulatory Visit (INDEPENDENT_AMBULATORY_CARE_PROVIDER_SITE_OTHER): Admitting: Clinical

## 2023-10-06 DIAGNOSIS — F411 Generalized anxiety disorder: Secondary | ICD-10-CM

## 2023-10-06 NOTE — Progress Notes (Signed)
 [facility] Counselor/Therapist Progress Note  Patient ID: Diane [name], MRN: [id number],    Date: 10/06/2023  Time Spent: 9:33am - 10:19am : 46 minutes   Treatment Type: Individual Therapy  Reported Symptoms: Patient reported feeling anxious and reported increased heart rate yesterday  Mental Status Exam: Appearance:  Neat and Well Groomed     Behavior: Appropriate  Motor: Normal  Speech/Language:  Clear and Coherent and Normal Rate  Affect: Appropriate  Mood: normal  Thought process: normal  Thought content:   WNL  Sensory/Perceptual disturbances:   WNL  Orientation: oriented to person, place, time/date, and situation  Attention: Good  Concentration: Good  Memory: WNL  Fund of knowledge:  Good  Insight:   Good  Judgment:  Good  Impulse Control: Good   Risk Assessment: Danger to Self:  No Patient denied current suicidal ideation  Self-injurious Behavior: No Danger to Others: No Patient denied current homicidal ideation Duty to Warn:no Physical Aggression / Violence:No  Access to Firearms a concern: No  Gang Involvement:No   Subjective: Patient stated, "I've been pretty good honestly" in response to events since last session. Patient reported an "anxious feeling" yesterday and stated, "other than that it's been a pretty good two weeks".  Patient reported no triggering events for symptoms of anxiety yesterday. Patient stated, "not really" in response to thought record entries. Patient reported she has been maintaining a routine sleep schedule and reported she would like to increase her physical activity level. Patient reported she would like to exercise 30 minutes per day.  Patient reported balancing patient's schedule and stated, "lack of motivation", "staying motivated", "for me the barrier is starting" are barriers to achieving patient's goal to exercise daily. Patient identified the following steps towards patient's goal: setting alarm for 6:15am and  pressing snooze only once, setting out workout clothes/shoes each night, filling water bottle each night, put ear buds in patient's car, putting on workout clothes/shoes each morning prior to taking children to school, walking for 10-15 minutes after returning home from school and prior to going into the house. Patient stated, "feels good" in response to steps towards patient's goal. Patient reported feeling "good" today.   Interventions: Cognitive Behavioral Therapy. Clinician conducted session via caregility video from clinician's home office. Patient provided verbal consent to proceed with telehealth session and is aware of limitations of telephone or video visits. Patient participated in session from patient's home. Reviewed events since last session and assessed for changes. Explored triggers for symptoms of anxiety yesterday. Reviewed patient's thought record. Provided psycho education related to benefits of sleep hygiene and physical activity. Provided psycho education related to behavioral activation and breaking goals into smaller steps. Explored barriers to physical activity. Assisted patient in establishing smaller steps towards patient's goal of exercising 30 minutes per day. Clinician requested for homework patient continue thought record and use questions to challenge cognitive distortions/negative thoughts and practice behavioral activation.    Collaboration of Care: not required at this time   Diagnosis:  Generalized anxiety disorder     Plan: Patient is to utilize [employer] Therapy, thought re-framing, behavioral activation, mindfulness and coping strategies to decrease symptoms associated with their diagnosis. Frequency: bi-weekly  Modality: individual      Long-term goal:   Reduce overall level, frequency, and intensity of the feelings of anxiety as evidenced by decreased "worrying about worse case scenario situations", irritability, overstimulation by noises in the  environment, "my mind just starts spiraling", nausea when feeling anxious, shaky feeling, feeling  on edge, lack of motivation when anxious, difficulty concentrating, fatigue and increased sleep when feeling anxious from 6 to 7 days/week to 0 to 1 days/week per patient report for at least 3 consecutive months. Target Date: 11/04/23  Progress: progressing    Short-term goal:  Develop and implement mindfulness strategies to increase patient being present when spending time with family and other activities Target Date: 11/04/23  Progress: progressing    Increase and maintain daily self care strategies, such as, drinking water, eating 3 healthy meals per day, healthy sleep habits, exercising, and work/life balance Target Date: 11/04/23  Progress: progressing    Develop and implement coping strategies and effective stress management strategies to decrease feelings of irritability and negative responses to stress Target Date: 11/04/23  Progress: progressing    Identify, challenge, and replace negative thought patterns that contribute to feelings of guilt and anxiety with positive thoughts and beliefs per patient's report Target Date: 11/04/23  Progress: progressing    Begin healthy grieving process Target Date: 11/04/23  Progress: progressing    Burlene Carpen, LCSW

## 2023-10-06 NOTE — Progress Notes (Signed)
   Doree Barthel, LCSW

## 2023-11-03 ENCOUNTER — Ambulatory Visit (INDEPENDENT_AMBULATORY_CARE_PROVIDER_SITE_OTHER): Admitting: Clinical

## 2023-11-03 DIAGNOSIS — F411 Generalized anxiety disorder: Secondary | ICD-10-CM | POA: Diagnosis not present

## 2023-11-03 NOTE — Progress Notes (Signed)
   Diane Barthel, LCSW

## 2023-11-03 NOTE — Progress Notes (Signed)
 [facility] Counselor/Therapist Progress Note  Patient ID: Diane [name], MRN: [id number],    Date: 11/03/2023  Time Spent: 10:34am - 11:18am : 44 minutes   Treatment Type: Individual Therapy  Reported Symptoms: Patient reported depressive symptoms once a month for 1-2 days (fatigue, loss of motivation, decrease in energy, low mood)  Mental Status Exam: Appearance:  Neat and Well Groomed     Behavior: Appropriate  Motor: Normal  Speech/Language:  Clear and Coherent and Normal Rate  Affect: Appropriate  Mood: normal  Thought process: normal  Thought content:   WNL  Sensory/Perceptual disturbances:   WNL  Orientation: oriented to person, place, time/date, and situation  Attention: Good  Concentration: Good  Memory: WNL  Fund of knowledge:  Good  Insight:   Good  Judgment:  Good  Impulse Control: Good   Risk Assessment: Danger to Self:  No Patient denied current suicidal ideation  Self-injurious Behavior: No Danger to Others: No Patient denied current homicidal ideation Duty to Warn:no Physical Aggression / Violence:No  Access to Firearms a concern: No  Gang Involvement:No   Subjective: Patient reported no changes since last session and stated, "good" in response to patient's mood since last session. Patient reported " a couple of dips" in mood since last session. Patient stated, "I couldn't do anything" in reference to decline in mood. Patient reported an increase in stress in response to inactivity. Patient reported feeling lethargic  and stated, "I couldn't find the motivation" during decline in mood. Patient reported feeling overwhelmed in response to household tasks to be completed and when feeling behind in completing household tasks. Patient stated, "I think that would be helpful" in response to breaking tasks into smaller steps and prioritizing tasks.  Patient stated, "it was feeling blah for lack of a better word" in response to patient's mood during  decline in mood. Patient reported depressive symptoms occur once a month for 1-2 days. Patient identified listening to music, reading a book, going for a walk, and going out for coffee as activities patient enjoys. Patient stated, "I can definitely try it" in response to opposite action. Patient stated,  "It went ok the week that we talked about it" and patient reported she went for a walk twice that week. Patient stated, "then May kind of hit and it's been super busy". Patient identified patient's work schedule and stated, "not being a morning person thing", "I'm just tired in the morning" as barriers to behavioral activation. Patient stated, "I feel better after doing that" in reference to going for a walk. Patient stated,  "finding the motivation to get there is hard without something else looming".   Interventions: Cognitive Behavioral Therapy and DBT skills. Clinician conducted session via caregility video from clinician's home office. Patient provided verbal consent to proceed with telehealth session and is aware of limitations of telephone or video visits. Patient participated in session from patient's home. Reviewed events since last session and assessed for changes. Explored triggers for recent decline in mood. Provided psycho education related to breaking tasks into smaller steps/tasks and prioritizing tasks. Provided psycho education related to depressive symptoms. Provided psycho education related to use of opposite action. Explored and identified activities patient finds enjoyable. Reviewed behavioral activation strategies discussed during previous session and the outcome. Explored and identified barriers to practicing behavioral activation. Provided psycho education related to reframing language using positive language, positive self talk. Clinician challenged statements to assist patient in reframing negative self talk to positive self talk. Provided psycho  education regarding completion of a  cost/benefit analysis. Clinician requested for homework patient continue to practice behavioral activation, practice opposition action, and complete a cost/benefit analysis for refraining from self care.    Collaboration of Care: not required at this time   Diagnosis:  Generalized anxiety disorder     Plan: Patient is to utilize [employer] Therapy, thought re-framing, behavioral activation, mindfulness and coping strategies to decrease symptoms associated with their diagnosis. Frequency: bi-weekly  Modality: individual      Long-term goal:   Reduce overall level, frequency, and intensity of the feelings of anxiety as evidenced by decreased "worrying about worse case scenario situations", irritability, overstimulation by noises in the environment, "my mind just starts spiraling", nausea when feeling anxious, shaky feeling, feeling on edge, lack of motivation when anxious, difficulty concentrating, fatigue and increased sleep when feeling anxious from 6 to 7 days/week to 0 to 1 days/week per patient report for at least 3 consecutive months. Target Date: 11/04/23  Progress: progressing    Short-term goal:  Develop and implement mindfulness strategies to increase patient being present when spending time with family and other activities Target Date: 11/04/23  Progress: progressing    Increase and maintain daily self care strategies, such as, drinking water, eating 3 healthy meals per day, healthy sleep habits, exercising, and work/life balance Target Date: 11/04/23  Progress: progressing    Develop and implement coping strategies and effective stress management strategies to decrease feelings of irritability and negative responses to stress Target Date: 11/04/23  Progress: progressing    Identify, challenge, and replace negative thought patterns that contribute to feelings of guilt and anxiety with positive thoughts and beliefs per patient's report Target Date: 11/04/23  Progress:  progressing    Begin healthy grieving process Target Date: 11/04/23  Progress: progressing    Burlene Carpen, LCSW

## 2023-11-18 ENCOUNTER — Encounter: Payer: Self-pay | Admitting: Obstetrics

## 2023-11-24 ENCOUNTER — Ambulatory Visit (INDEPENDENT_AMBULATORY_CARE_PROVIDER_SITE_OTHER): Admitting: Clinical

## 2023-11-24 ENCOUNTER — Encounter: Payer: Self-pay | Admitting: Clinical

## 2023-11-24 DIAGNOSIS — F411 Generalized anxiety disorder: Secondary | ICD-10-CM | POA: Diagnosis not present

## 2023-11-24 NOTE — Progress Notes (Signed)
 [facility] Counselor/Therapist Progress Note  Patient ID: Diane [name], MRN: [id number],    Date: 11/24/2023  Time Spent: 9:33am - 10:30am : 57 minutes  Treatment Type: Individual Therapy  Reported Symptoms: difficulty sleeping last night, irritability this morning  Mental Status Exam: Appearance:  Neat and Well Groomed     Behavior: Appropriate  Motor: Normal  Speech/Language:  Clear and Coherent and Normal Rate  Affect: Appropriate  Mood: normal  Thought process: normal  Thought content:   WNL  Sensory/Perceptual disturbances:   WNL  Orientation: oriented to person, place, time/date, and situation  Attention: Good  Concentration: Good  Memory: WNL  Fund of knowledge:  Good  Insight:   Good  Judgment:  Good  Impulse Control: Good   Risk Assessment: Danger to Self:  No Patient denied current suicidal ideation  Self-injurious Behavior: No Danger to Others: No Patient denied current homicidal ideation Duty to Warn:no Physical Aggression / Violence:No  Access to Firearms a concern: No  Gang Involvement:No   Subjective: Patient stated, "its been ok, it's been good" in response to events since last session. Patient reported May is a stressful month due to children's school schedules and patient/husband's work schedules. Patient reported patient and patient's husband recently had a stomach virus and cancelled their vacation plans due to illnesses and work schedules. Patient reported patient's son has been experiencing issues with his stomach. Patient reported providers believe son may be experiencing symptoms of anxiety and son is currently in therapy. Patient reported "a significant worry" in regards to son's symptoms.  Patient reported catastrophizing thoughts in response to son's symptoms and stated "I worry, am I taking your (son's) symptoms serious enough". Patient stated, "I have not been able to implement a lot of those self care things" and reported  patient's schedule in May has been a barrier. Patient stated, "I did try to do my pros and cons list with self care" and reported she was not able to identify cons. Patient reported she identified multiple pros to performing self care. Patient stated, "today we're going to make a push and break things down into smaller steps". Patient reported thoughts, "if I'm not doing something all the way, am I doing enough". Patient reported she has not practiced opposite action.  Patient stated, "today feels pretty good" in response to current mood.  Patient stated, "I think I'm definitely making improvements with it", "I wouldn't say I'm at 0 to 1, I'd say more like 3 to 4", "the time period of the anxiety is reduced" in response to patient's long term goal. Patient stated, "I think that's improving" in response to patient's first short term goal. Patient stated, "I have been doing better for the most part with sleeping I think", "the rest of it not so much" in response to patient's second short term goal. Patient stated, "I think that the coping strategies to manage the anxiety have been helpful, I think as a result the amount of irritability,snapping and losing my temper has decreased" and reported feeling she would like to continue to work on coping strategies to utilize in the moment when feeling stressed in response to patient's third short term goal. Patient stated, "I think the overall instances that would cause those negative thought patterns have decreased so that in turn has decreased", "in the moment those negative thought patterns and guilt have not decreased" (in reference to when anxiety is heightened) in response to patient's fourth short term goal. Patient stated, "I think that  one's harder to say because I still feel the heaviness, I still feel the sadness, theres times its more triggering than others", "I also feel like I struggle with the idea I guess theres like a guilt associated there too" in response  to patient's fifth short term goal.   Interventions: Cognitive Behavioral Therapy. Clinician conducted session via caregility video from clinician's home office. Patient provided verbal consent to proceed with telehealth session and is aware of limitations of telephone or video visits. Patient participated in session from patient's home. Reviewed events since last session and assessed for changes. Discussed recent stressors. Explored and identified thoughts and feelings triggered by son's health. Provided psycho education related to symptoms of anxiety. Reviewed behavioral activation and opposite action. Discussed barriers to practicing behavioral activation and opposite action. Reviewed patient's cost/benefit analysis.  Reviewed patient's goals for therapy and patient's progress. Clinician requested for homework patient continue to practice behavioral activation, practice opposition action.   Collaboration of Care: not required at this time   Diagnosis:  Generalized anxiety disorder     Plan: Patient is to utilize [employer] Therapy, thought re-framing, behavioral activation, mindfulness and coping strategies to decrease symptoms associated with their diagnosis. Frequency: bi-weekly  Modality: individual      Long-term goal:   Reduce overall level, frequency, and intensity of the feelings of anxiety as evidenced by decreased "worrying about worse case scenario situations", irritability, overstimulation by noises in the environment, "my mind just starts spiraling", nausea when feeling anxious, shaky feeling, feeling on edge, lack of motivation when anxious, difficulty concentrating, fatigue and increased sleep when feeling anxious from 6 to 7 days/week to 0 to 1 days/week per patient report for at least 3 consecutive months. Target Date: 11/03/24  Progress: progressing    Short-term goal:  Develop and implement mindfulness strategies to increase patient being present when spending time  with family and other activities Target Date: 11/23/24  Progress: progressing    Increase and maintain daily self care strategies, such as, drinking water, eating 3 healthy meals per day, healthy sleep habits, exercising, and work/life balance Target Date: 11/23/24  Progress: progressing    Develop and implement coping strategies and effective stress management strategies to decrease feelings of irritability and negative responses to stress Target Date: 11/23/24  Progress: progressing    Identify, challenge, and replace negative thought patterns that contribute to feelings of guilt and anxiety with positive thoughts and beliefs per patient's report Target Date: 11/23/24  Progress: progressing    Begin healthy grieving process Target Date: 11/23/24  Progress: progressing    Burlene Carpen, LCSW

## 2023-11-24 NOTE — Progress Notes (Signed)
   Doree Barthel, LCSW

## 2023-12-08 ENCOUNTER — Ambulatory Visit (INDEPENDENT_AMBULATORY_CARE_PROVIDER_SITE_OTHER): Admitting: Clinical

## 2023-12-08 DIAGNOSIS — F4321 Adjustment disorder with depressed mood: Secondary | ICD-10-CM | POA: Diagnosis not present

## 2023-12-08 DIAGNOSIS — F411 Generalized anxiety disorder: Secondary | ICD-10-CM | POA: Diagnosis not present

## 2023-12-08 NOTE — Progress Notes (Unsigned)
   Diane Barthel, LCSW

## 2023-12-08 NOTE — Progress Notes (Unsigned)
 [facility] Counselor Initial Adult Exam  Name: Diane [name] Date: 12/08/2023 MRN: [id number] DOB: [birth year] PCP: Gabriel John, NP  Time spent: 10:33am - 11:15am   Guardian/Payee:  NA    Paperwork requested: NA  Reason for Visit /Presenting Problem: Patient stated, still anxiety and still working through the grief from losing my dad.   Mental Status Exam: Appearance:   Neat and Well Groomed     Behavior:  Appropriate  Motor:  Normal  Speech/Language:   Clear and Coherent and Normal Rate  Affect:  Tearful  Mood:  sad  Thought process:  normal  Thought content:    WNL  Sensory/Perceptual disturbances:    WNL  Orientation:  oriented to person, place, situation, day of week, month of year, and year  Attention:  Good  Concentration:  Good  Memory:  WNL  Fund of knowledge:   Good  Insight:    Good  Judgment:   Good  Impulse Control:  Good   Reported Symptoms:  Patient stated, digestive issues with the anxiety, heart racing, light headedness, difficulty concentrating/focusing, difficulty accomplishing tasks, irritability, short tempered, inability to go when feeling anxious, feeling on edge, muscle tension, difficulty falling asleep, it feels less like a constant every day heaviness that's always sitting there but there's frequent waves, I think that I am more able to talk about it without breaking down crying all the time, it feels a little less suffocating, sadness in relation to grief, constant sadness that my dad's not here, ruminating thoughts/worst case scenario thoughts  Risk Assessment: Danger to Self:  No Patient denied current and past suicidal ideation and  symptoms of psychosis.  Self-injurious Behavior: No Danger to Others: No Patient denied current and past homicidal ideation Duty to Warn:no Physical Aggression / Violence:No  Access to Firearms a concern: No  Gang Involvement:No  Patient / guardian was educated about  steps to take if suicide or homicide risk level increases between visits: yes While future psychiatric events cannot be accurately predicted, the patient does not currently require acute inpatient psychiatric care and does not currently meet [facility]  involuntary commitment criteria.  Substance Abuse History: Current substance abuse: No   Patient stated, I drink (alcohol) socially on rare occasion. Patient reported no current or past tobacco or drug use.   Past Psychiatric History:   Previous psychological history is significant for anxiety Outpatient Providers: Patient reported brief individual therapy during childhood and is currently participating in individual therapy with Burlene Carpen, MSW, LCSW at [facility]. Patient reported patient had an appointment with a psychiatrist for a consultation but reported she cancelled the appointment due to insurance concerns.  History of Psych Hospitalization: No  Psychological Testing: none   Abuse History:  Victim of: No., none   Report needed: No. Victim of Neglect:No. Perpetrator of none reported  Witness / Exposure to Domestic Violence: No   Protective Services Involvement: No  Witness to [employer] Violence:  No   Family History:  Family History  Problem Relation Age of Onset   Diabetes Father    Asthma Father    Hypertension Father    Other Brother    Heart disease Maternal Grandmother    Colon cancer Maternal Grandmother    Breast cancer Maternal Grandmother    Pancreatic cancer Paternal Grandmother    Heart disease Paternal Grandfather    Thyroid  disease Neg Hx     Living situation: the patient lives with their family (husband, 2 children)  Sexual Orientation: Straight  Relationship Status: married  Name of spouse / other: Severa Daniels If a parent, number of children / ages: 2 children (ages 13, 46)  Support Systems: spouse Mother, friends, brothers, sister in Social worker, mother and father in Teacher, English as a foreign language Stress:   Yes   Income/Employment/Disability: Employment - self employed  Financial planner: No   Educational History: Education: post Engineer, maintenance (IT) work or degree  Religion/Sprituality/World View: Catholic  Any cultural differences that may affect / interfere with treatment:  not applicable   Recreation/Hobbies: reading, baking, watching television/movies with husband, playing games with husband  Stressors: Loss of father, finances/debt, my anxiety is a stressor for me, son's health    Strengths: spouse, children  Barriers:  symptoms of anxiety/thoughts associated with anxiety   Legal History: Pending legal issue / charges: The patient has no significant history of legal issues. 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 02/2022 [employer]: predominantly sinus rhythm @ 85 (53-162). 1 symptomatic run of SVT @ 162 x 16 beats. Rare PACs.   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    Past Surgical History:  Procedure Laterality Date   CHOLECYSTECTOMY N/A 11/28/2017   Procedure: LAPAROSCOPIC CHOLECYSTECTOMY WITH INTRAOPERATIVE CHOLANGIOGRAM;  Surgeon: Jacolyn Matar, MD;  Location: [facility] ORS;  Service: General;  Laterality: N/A;   LAPAROSCOPY  2009   nsvd  2018,2016   x 2    Medications: Current Outpatient Medications  Medication Sig Dispense Refill   Accu-Chek Softclix Lancets lancets 1 each by Other route 4  (four) times daily. (Patient not taking: Reported on 07/28/2023) 100 each 12   albuterol  (PROAIR  HFA) 108 (90 Base) MCG/ACT inhaler Inhale 2 puffs into the lungs every 6 (six) hours as needed for wheezing or shortness of breath. 1 each 0   diltiazem  (CARDIZEM ) 30 MG tablet Take 1 tablet (30 mg total) by mouth 2 (two) times daily as needed. (Patient not taking: Reported on 07/28/2023) 60 tablet 11   glucose blood (ACCU-CHEK SMARTVIEW) test strip Use as instructed to check blood sugars (Patient not taking: Reported on 07/28/2023) 100 each 12   norgestimate -ethinyl estradiol  (ESTARYLLA) 0.25-35 MG-MCG tablet Take 1 tablet by mouth daily. 84 tablet 3   predniSONE  (DELTASONE ) 20 MG tablet Take 2 tablets (40 mg total) by mouth daily with breakfast. (Patient not taking: Reported on 07/28/2023) 10 tablet 0   Vitamin D , Ergocalciferol , (DRISDOL ) 1.25 MG (50000 UNIT) CAPS capsule Take 1 capsule (50,000 Units total) by mouth every 7 (seven) days. (Patient not taking: Reported on 07/28/2023) 10 capsule 0   No current facility-administered medications for this visit.    No Known Allergies   Diagnoses:  Generalized anxiety disorder  Adjustment disorder with depressed mood  Plan of Care: Patient is a 38 year old female who presented for an assessment. Clinician conducted assessment via caregility video from clinician's  home office. Patient provided verbal consent to proceed with telehealth session and is aware of limitations of telephone or video visits. Patient participated in session from patient's home. Patient reported the following symptoms: digestive issues with the anxiety, heart racing, light headedness, difficulty concentrating/focusing, difficulty accomplishing tasks, irritability, short tempered, inability to go when feeling anxious, feeling on edge, muscle tension, difficulty falling asleep, it feels less like a constant every day heaviness that's always sitting there but there's frequent waves in  reference to grief, constant sadness that my dad's not here, ruminating thoughts/worst case scenario thoughts. Patient denied current and past suicidal ideation, homicidal ideation, and symptoms of psychosis. Patient reported alcohol use occasionally. Patient reported no current or past tobacco or drug use. Patient reported current participation in individual therapy. Patient reported no history of psychiatric hospitalizations. Patient reported the loss of patient's father, finances/debt, symptoms of anxiety, and son's health are current stressors. Patient identified patient's spouse, mother, friends, brothers, sister in law, mother and father in law as current supports. It is recommended patient be referred to a psychiatrist for a medication management consult and recommended patient continue to participate in individual therapy every two weeks. Clinician will review recommendations and treatment plan with patient during follow up appointment. Treatment plan will be developed during follow up appointment.   Collaboration of Care: Other Patient declined consents for providers at this time  Patient/Guardian was advised Release of Information must be obtained prior to any record release in order to collaborate their care with an outside provider. Patient/Guardian was advised if they have not already done so to contact [facility] to sign all necessary forms in order for us  to release information regarding their care.   Consent: Patient/Guardian gives verbal consent for treatment and assignment of benefits for services provided during this visit. Patient/Guardian expressed understanding and agreed to proceed.   Burlene Carpen, LCSW

## 2023-12-22 ENCOUNTER — Ambulatory Visit (INDEPENDENT_AMBULATORY_CARE_PROVIDER_SITE_OTHER): Admitting: Clinical

## 2023-12-22 DIAGNOSIS — F4321 Adjustment disorder with depressed mood: Secondary | ICD-10-CM

## 2023-12-22 DIAGNOSIS — F411 Generalized anxiety disorder: Secondary | ICD-10-CM

## 2023-12-22 NOTE — Progress Notes (Signed)
 [name]  Diane [name] ID: Diane [name], MRN: [id number],    Date: 12/22/2023  Time Spent: 8:34am - 9:28am : 54 minutes   Treatment Type: Individual Therapy  Reported Symptoms: worry  Mental Status Exam: Appearance:  Neat and Well Groomed     Behavior: Appropriate  Motor: Normal  Speech/Language:  Clear and Coherent and Normal Rate  Affect: Appropriate  Mood: normal  Thought process: normal  Thought content:   WNL  Sensory/Perceptual disturbances:   WNL  Orientation: oriented to person, place, time/date, and situation  Attention: Good  Concentration: Good  Memory: WNL  Fund of knowledge:  Good  Insight:   Good  Judgment:  Good  Impulse Control: Good   Risk Assessment: Danger to Self:  No Diane [name] denied current suicidal ideation  Self-injurious Behavior: No Danger to Others: No Diane [name] denied current homicidal ideation  Duty to Warn:no Physical Aggression / Violence:No  Access to Firearms a concern: No  Gang Involvement:No   Subjective: Diane [name] stated, they've been good in response to events since last session. Diane [name] stated, its been good in response to mood since last session. Diane [name] stated, everything you (clinician) said made sense in response to psycho education regarding diagnoses. Diane [name] stated, I just need to do it in response to a consultation with a psychiatrist. Diane [name] stated, It feels paralyzing for myself, Im sure it would be helpful in reference to consultation with a psychiatrist. Diane [name] reported worry about potential side effects of medications, potential dependence on medication, feels meeting new people/new places are intimidating, and disclosing information to another provider is overwhelming. Diane [name] stated, I have been trying to get up and take a short walk each day. Diane [name] reported Diane [name] has been walking a couple of times per week and has practiced the use of opposite action as it  relates to going for walks. Diane [name] reported Diane [name] signed up for [employer] and has tried a workout on the app. Diane [name] stated, I feel like I accomplished something at least and glad I did what I'm suppose to be doing in response to Diane [name]'s homework. Diane [name] reported Diane [name] noted in Diane [name]'s thought record a recent situation where Diane [name]'s son was experiencing stomach issues and had to have an xray. Diane [name] stated,  I definitely had a deep spiral that day, Its just been a lot, Ive kind of worked through that in Education administrator to [employer]. Diane [name] stated,  I did try to do that in reference to generating alternatives. Diane [name] reported Diane [name] has been practicing mindfulness exercise of the 5 senses and has found the exercise helpful. Diane [name] stated, It centers myself and I feel better in reference to 5 senses exercise.   Interventions: Cognitive Behavioral Therapy and Motivational Interviewing. Clinician conducted session via caregility video from clinician's home office. Diane [name] provided verbal consent to proceed with telehealth session and is aware of limitations of telephone or video visits. Diane [name] participated in session from Diane [name]'s home. Reviewed events since last session and assessed for changes. Clinician reviewed diagnoses and treatment recommendations. Provided psycho education related to diagnoses and treatment recommendations. Explored Diane [name]'s thoughts and feelings regarding a consultation with a psychiatrist. Reviewed Diane [name]'s homework to practice behavioral activation, opposite action, and the outcome. Reviewed Diane [name]'s thought record. Provided psycho education related to generating alternatives. Clinician requested for homework Diane [name] continue thought record and to practice behavioral activation and opposition action.   Collaboration of Care: not required at this time   Diagnosis:  Generalized anxiety disorder  Adjustment disorder with  depressed mood    Plan:  Diane [name] is to utilize [employer], thought re-framing, behavioral activation, mindfulness and coping strategies to decrease symptoms associated with their diagnosis. Frequency: bi-weekly  Modality: individual      Long-term goal:   Reduce overall level, frequency, and intensity of the feelings of anxiety as evidenced by decreased worrying about worse case scenario situations, irritability, overstimulation by noises in the environment, my mind just starts spiraling, nausea when feeling anxious, shaky feeling, feeling on edge, lack of motivation when anxious, difficulty concentrating, fatigue and increased sleep when feeling anxious from 6 to 7 days/week to 0 to 1 days/week per Diane [name] report for at least 3 consecutive months. Target Date: 11/03/24  Progress: progressing    Short-term goal:  Develop and implement mindfulness strategies to increase Diane [name] being present when spending time with family and other activities Target Date: 11/23/24  Progress: progressing    Increase and maintain daily self care strategies, such as, drinking water, eating 3 healthy meals per day, healthy sleep habits, exercising, and work/life balance Target Date: 11/23/24  Progress: progressing    Develop and implement coping strategies and effective stress management strategies to decrease feelings of irritability and negative responses to stress Target Date: 11/23/24  Progress: progressing    Identify, challenge, and replace negative thought patterns that contribute to feelings of guilt and anxiety with positive thoughts and beliefs per Diane [name]'s report Target Date: 11/23/24  Progress: progressing    Begin healthy grieving process Target Date: 11/23/24  Progress: progressing     Darice Seats, LCSW

## 2023-12-22 NOTE — Progress Notes (Signed)
   Diane Barthel, LCSW

## 2024-01-12 ENCOUNTER — Ambulatory Visit: Admitting: Clinical

## 2024-01-30 ENCOUNTER — Ambulatory Visit: Admitting: Clinical

## 2024-01-30 DIAGNOSIS — F4321 Adjustment disorder with depressed mood: Secondary | ICD-10-CM | POA: Diagnosis not present

## 2024-01-30 DIAGNOSIS — F411 Generalized anxiety disorder: Secondary | ICD-10-CM | POA: Diagnosis not present

## 2024-01-30 NOTE — Progress Notes (Signed)
   Doree Barthel, LCSW

## 2024-01-30 NOTE — Progress Notes (Signed)
 [facility] Counselor/Therapist Progress Note  Patient ID: Diane [name], MRN: [id number],    Date: 01/30/2024  Time Spent: 9:34am - 10:25am : 51 minutes   Treatment Type: Individual Therapy  Reported Symptoms: fatigue, feeling overwhelmed, over stimulated this week  Mental Status Exam: Appearance:  Neat and Well Groomed     Behavior: Appropriate  Motor: Normal  Speech/Language:  Clear and Coherent and Normal Rate  Affect: Tearful when discussing loss of father  Mood: sad when discussing loss of father  Thought process: normal  Thought content:   WNL  Sensory/Perceptual disturbances:   WNL  Orientation: oriented to person, place, time/date, and situation  Attention: Good  Concentration: Good  Memory: WNL  Fund of knowledge:  Good  Insight:   Good  Judgment:  Good  Impulse Control: Good   Risk Assessment: Danger to Self:  No Patient denied current suicidal ideation  Self-injurious Behavior: No Danger to Others: No Patient denied current homicidal ideation Duty to Warn:no Physical Aggression / Violence:No  Access to Firearms a concern: No  Gang Involvement:No   Subjective: Patient stated, it's been ok, it's been busy in response to events since last session. Patient stated, for the most part it was good in reference to recent vacation. Patient stated, there was a lot of stress and angst that went into all this in reference to second part of vacation with friends. Patient stated, I was frustrated, angry and felt very overwhelmed with all of it in reference to second part of of vacation. Patient reported patient's brother and brother's significant other recently visited and patient stated,  the visit went well, it was hard, it feels weird without my dad, the space without my dad felt heavier. Patient stated, just knowing all of these things (events) my dad won't be here for. Patient stated, I don't feel sad enough, I feel like I'm betraying my  dad, I know it's not rational, I know it doesn't make any sense. Patient stated, I feel like missing my dad and being sad are linked. Patient stated, I would say it's (sadness) not impacting my day to day life as it did in the beginning. Patient stated, I still feel emotionally exhausted from vacation in response to patient's mood. Patient stated, I did really try to do it in reference to behavioral activation and stated, that was helpful. Patient stated, I use that a lot of times in reference to 5 senses exercise and stated, I think I'm always surprised at how well it works, I do feel calmer every time I do it.  Interventions: Cognitive Behavioral Therapy. Clinician conducted session via caregility video from clinician's home office. Patient provided verbal consent to proceed with telehealth session and is aware of limitations of telephone or video visits. Patient participated in session from patient's home. Reviewed events since last session and assessed for changes. Discussed patient's response to recent vacation. Explored thoughts/feelings triggered by brother's visit. Processed thoughts/feelings related to grief/loss of patient's father. Provided psycho education related to grief. Challenged statements to assist client in identifying irrational thoughts associated with grief.  Reviewed patient's homework. Reviewed patient's implementation of behavioral activation and mindfulness exercise of 5 senses and the outcome of each. Clinician requested for homework patient continue thought record and to practice behavioral activation and opposition action.  Collaboration of Care: not required at this time   Diagnosis:  Generalized anxiety disorder   Adjustment disorder with depressed mood    Plan: Patient is to utilize [employer],  thought re-framing, behavioral activation, mindfulness and coping strategies to decrease symptoms associated with their diagnosis. Frequency:  bi-weekly  Modality: individual      Long-term goal:   Reduce overall level, frequency, and intensity of the feelings of anxiety as evidenced by decreased worrying about worse case scenario situations, irritability, overstimulation by noises in the environment, my mind just starts spiraling, nausea when feeling anxious, shaky feeling, feeling on edge, lack of motivation when anxious, difficulty concentrating, fatigue and increased sleep when feeling anxious from 6 to 7 days/week to 0 to 1 days/week per patient report for at least 3 consecutive months. Target Date: 11/03/24  Progress: progressing    Short-term goal:  Develop and implement mindfulness strategies to increase patient being present when spending time with family and other activities Target Date: 11/23/24  Progress: progressing    Increase and maintain daily self care strategies, such as, drinking water, eating 3 healthy meals per day, healthy sleep habits, exercising, and work/life balance Target Date: 11/23/24  Progress: progressing    Develop and implement coping strategies and effective stress management strategies to decrease feelings of irritability and negative responses to stress Target Date: 11/23/24  Progress: progressing    Identify, challenge, and replace negative thought patterns that contribute to feelings of guilt and anxiety with positive thoughts and beliefs per patient's report Target Date: 11/23/24  Progress: progressing    Begin healthy grieving process Target Date: 11/23/24  Progress: progressing    Darice Seats, LCSW

## 2024-02-09 ENCOUNTER — Ambulatory Visit (INDEPENDENT_AMBULATORY_CARE_PROVIDER_SITE_OTHER): Admitting: Clinical

## 2024-02-09 DIAGNOSIS — F4321 Adjustment disorder with depressed mood: Secondary | ICD-10-CM | POA: Diagnosis not present

## 2024-02-09 DIAGNOSIS — F411 Generalized anxiety disorder: Secondary | ICD-10-CM

## 2024-02-09 NOTE — Progress Notes (Unsigned)
 [facility] Counselor/Therapist Progress Note  Patient ID: Diane [name], MRN: [id number],    Date: 02/09/2024  Time Spent: 2:35pm - 3:25pm : 50 minutes   Treatment Type: Individual Therapy  Reported Symptoms: sadness, anxiety, feeling disconnected  Mental Status Exam: Appearance:  Neat and Well Groomed     Behavior: Appropriate  Motor: Normal  Speech/Language:  Clear and Coherent and Normal Rate  Affect: Tearful  Mood: sad  Thought process: normal  Thought content:   WNL  Sensory/Perceptual disturbances:   WNL  Orientation: oriented to person, place, time/date, and situation  Attention: Good  Concentration: Good  Memory: WNL  Fund of knowledge:  Good  Insight:   Good  Judgment:  Good  Impulse Control: Good   Risk Assessment: Danger to Self:  No Patient denied current suicidal ideation  Self-injurious Behavior: No Danger to Others: No Patient denied current homicidal ideation Duty to Warn:no Physical Aggression / Violence:No  Access to Firearms a concern: No  Gang Involvement:No   Subjective: Patient stated, they've been ok  in response to events since last session. Patient reported preparing for children to return to school and stated, I don't do transition or change well. Patient stated, I've been ok in response to patient's mood since last session. Patient reported patient's husband verbalized observations in patient's mood recently. Patient reported feeling disconnected from husband and in general. Patient stated, I felt much better after we (husband/patient) talked last night. Patient reported difficulty being present and engaging with others when feeling disconnected. Patient reported lack of communication between patient/husband, holding the grief the anticipation of going back to school, the layers for me, and holding that stuff in and not talking with the people that are around me are triggers for feeling disconnected. Patient reported  waking up at times feeling like I'm already at an 8 in reference to intensity of anxiety. Patient stated,  I feel like its always the same stuff in reference to topic of conversation and reported internalizing patient's feelings are barriers to patient vocalizing patient's thoughts/feelings to others. Patient stated,  it would be worth a try, having an outlet might be helpful in response to journaling. Patient reported feeling unsettled in response to situation in thought record. Patient stated, I feel considerably better than I have in a while in response to patient's mood.   Interventions: Cognitive Behavioral Therapy. Clinician conducted session via caregility video from clinician's home office. Patient provided verbal consent to proceed with telehealth session and is aware of limitations of telephone or video visits. Patient participated in session from patient's home. Reviewed events since last session and assessed for changes. Explored triggers for feeling disconnected - Explored barriers to patient sharing feelings with others Discussed journaling -- Reviewed thought record - discussed conversation with family - challenged statements to assist patient in reframing situation. Discussed using positive self talk.Clinician requested for homework patient continue thought record and to practice behavioral activation and opposition action   Collaboration of Care: not required at this time   Diagnosis:  Generalized anxiety disorder   Adjustment disorder with depressed mood    Plan: Patient is to utilize Cognitive Behavioral Therapy, thought re-framing, behavioral activation, mindfulness and coping strategies to decrease symptoms associated with their diagnosis. Frequency: bi-weekly  Modality: individual      Long-term goal:   Reduce overall level, frequency, and intensity of the feelings of anxiety as evidenced by decreased worrying about worse case scenario situations, irritability,  overstimulation by noises in the environment, my  mind just starts spiraling, nausea when feeling anxious, shaky feeling, feeling on edge, lack of motivation when anxious, difficulty concentrating, fatigue and increased sleep when feeling anxious from 6 to 7 days/week to 0 to 1 days/week per patient report for at least 3 consecutive months. Target Date: 11/03/24  Progress: progressing    Short-term goal:  Develop and implement mindfulness strategies to increase patient being present when spending time with family and other activities Target Date: 11/23/24  Progress: progressing    Increase and maintain daily self care strategies, such as, drinking water, eating 3 healthy meals per day, healthy sleep habits, exercising, and work/life balance Target Date: 11/23/24  Progress: progressing    Develop and implement coping strategies and effective stress management strategies to decrease feelings of irritability and negative responses to stress Target Date: 11/23/24  Progress: progressing    Identify, challenge, and replace negative thought patterns that contribute to feelings of guilt and anxiety with positive thoughts and beliefs per patient's report Target Date: 11/23/24  Progress: progressing    Begin healthy grieving process Target Date: 11/23/24  Progress: progressing    Darice Seats, LCSW

## 2024-02-09 NOTE — Progress Notes (Unsigned)
   Darice Seats, LCSW

## 2024-03-02 ENCOUNTER — Encounter: Payer: Self-pay | Admitting: Primary Care

## 2024-03-02 ENCOUNTER — Telehealth: Payer: Self-pay | Admitting: *Deleted

## 2024-03-02 ENCOUNTER — Ambulatory Visit: Payer: Self-pay | Admitting: Primary Care

## 2024-03-02 ENCOUNTER — Ambulatory Visit: Admitting: Primary Care

## 2024-03-02 VITALS — BP 112/70 | HR 91 | Temp 97.8°F | Ht 65.0 in | Wt 212.0 lb

## 2024-03-02 DIAGNOSIS — N939 Abnormal uterine and vaginal bleeding, unspecified: Secondary | ICD-10-CM

## 2024-03-02 DIAGNOSIS — R7303 Prediabetes: Secondary | ICD-10-CM

## 2024-03-02 DIAGNOSIS — D500 Iron deficiency anemia secondary to blood loss (chronic): Secondary | ICD-10-CM

## 2024-03-02 DIAGNOSIS — E039 Hypothyroidism, unspecified: Secondary | ICD-10-CM

## 2024-03-02 DIAGNOSIS — R42 Dizziness and giddiness: Secondary | ICD-10-CM

## 2024-03-02 DIAGNOSIS — R7989 Other specified abnormal findings of blood chemistry: Secondary | ICD-10-CM

## 2024-03-02 DIAGNOSIS — R002 Palpitations: Secondary | ICD-10-CM

## 2024-03-02 DIAGNOSIS — R0602 Shortness of breath: Secondary | ICD-10-CM

## 2024-03-02 LAB — COMPREHENSIVE METABOLIC PANEL WITH GFR
ALT: 6 U/L (ref 0–35)
AST: 10 U/L (ref 0–37)
Albumin: 4 g/dL (ref 3.5–5.2)
Alkaline Phosphatase: 70 U/L (ref 39–117)
BUN: 13 mg/dL (ref 6–23)
CO2: 28 meq/L (ref 19–32)
Calcium: 9 mg/dL (ref 8.4–10.5)
Chloride: 102 meq/L (ref 96–112)
Creatinine, Ser: 0.62 mg/dL (ref 0.40–1.20)
GFR: 112.62 mL/min (ref >60.00)
Glucose, Bld: 119 mg/dL — ABNORMAL HIGH (ref 70–99)
Potassium: 4.2 meq/L (ref 3.5–5.1)
Sodium: 137 meq/L (ref 135–145)
Total Bilirubin: 0.4 mg/dL (ref 0.2–1.2)
Total Protein: 7 g/dL (ref 6.0–8.3)

## 2024-03-02 LAB — TSH: TSH: 1.62 u[IU]/mL (ref 0.35–5.50)

## 2024-03-02 LAB — CBC
HCT: 24.4 % — ABNORMAL LOW (ref 36.0–46.0)
Hemoglobin: 7 g/dL — CL (ref 12.0–15.0)
MCHC: 28.8 g/dL — ABNORMAL LOW (ref 30.0–36.0)
MCV: 55.3 fl — ABNORMAL LOW (ref 78.0–100.0)
Platelets: 553 K/uL — ABNORMAL HIGH (ref 150.0–400.0)
RBC: 4.41 Mil/uL (ref 3.87–5.11)
RDW: 18.9 % — ABNORMAL HIGH (ref 11.5–15.5)
WBC: 7.2 K/uL (ref 4.0–10.5)

## 2024-03-02 LAB — FERRITIN: Ferritin: 2.1 ng/mL — ABNORMAL LOW (ref 10.0–291.0)

## 2024-03-02 LAB — VITAMIN B12: Vitamin B-12: 206 pg/mL — ABNORMAL LOW (ref 211–911)

## 2024-03-02 LAB — HEMOGLOBIN A1C: Hgb A1c MFr Bld: 6.6 % — ABNORMAL HIGH (ref 4.6–6.5)

## 2024-03-02 LAB — VITAMIN D 25 HYDROXY (VIT D DEFICIENCY, FRACTURES): VITD: 29.23 ng/mL — ABNORMAL LOW (ref 30.00–100.00)

## 2024-03-02 NOTE — Patient Instructions (Signed)
 Stop by the lab prior to leaving today. I will notify you of your results once received.   It was a pleasure to see you today!

## 2024-03-02 NOTE — Progress Notes (Signed)
 Subjective:    Patient ID: Diane [name], female    DOB: [birth year], 38 y.o.   MRN: [id number]  Diane [name] is a very pleasant 38 y.o. female with a history of asthma, hypothyroidism, palpitations, prediabetes, hyperlipidemia with family history of early CAD who presents today to discuss dizziness.  Symptom onset 4-6 weeks ago with intermittent episodes of feeling dizzy/lightheaded. She notices with mild exertion mostly. Also feels weak, foggy head, intermittent ear pain, fatigue and shortness of breath with mild exertion with elevated HR. Symptoms occur a few times weekly lasting a few hours. Sitting and resting helps to improve symptoms.   In general, she hydrates well with water. She denies changes to medications, supplements, increased stress. She also denies nausea, syncope, visual changes, chest pain, palpitations, long travel, leg swelling. She's never had to take diltiazem .   Over the last 8 months she's noticed intermittent breakthrough vaginal bleeding in between menstrual cycles. The second and third day of her menstrual cycle are very heavy with clots. She is having to wear super tampons with pads during those two days. She is managed on OCPs per GYN. Denies history of uterine fibroids.  She has mentioned the symptoms to GYN and was told they were normal.  She has not undergone transvaginal/pelvic ultrasound.  Wt Readings from Last 3 Encounters:  03/02/24 212 lb (96.2 kg)  07/28/23 216 lb (98 kg)  03/14/23 205 lb (93 kg)   BP Readings from Last 3 Encounters:  03/02/24 112/70  07/28/23 116/72  03/14/23 122/70     Review of Systems  Constitutional:  Positive for fatigue.  Respiratory:  Positive for shortness of breath.   Cardiovascular:  Negative for chest pain and palpitations.  Genitourinary:  Positive for menstrual problem.  Neurological:  Positive for dizziness and light-headedness.  Psychiatric/Behavioral:  The patient is not nervous/anxious.           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 02/2022 [employer]: predominantly sinus rhythm @ 85 (53-162). 1 symptomatic run of SVT @ 162 x 16 beats. Rare PACs.   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    Social History   Socioeconomic History   Marital status: Married    Spouse name: Not on file   Number of children: Not on file   Years of education: Not on file   Highest education level: Not on file  Occupational History   Not on file  Tobacco Use   Smoking status: Never   Smokeless tobacco: Never  Vaping Use   Vaping status: Never Used  Substance and Sexual Activity   Alcohol use: Yes    Comment: occasional   Drug use: No   Sexual activity: Yes    Birth control/protection: Pill  Other Topics Concern   Not on file  Social History Narrative   Married.   2 children.   Masters in Programme researcher, broadcasting/film/video.   Enjoys reading, photography,  crafting.    Social Drivers of Corporate investment banker Strain: Not on file  Food Insecurity: Not on file  Transportation Needs: Not on file  Physical Activity: Not on file  Stress: Not on file  Social Connections: Not on file  Intimate Partner Violence: Not on file    Past Surgical History:  Procedure Laterality Date   CHOLECYSTECTOMY N/A 11/28/2017   Procedure: LAPAROSCOPIC CHOLECYSTECTOMY WITH INTRAOPERATIVE CHOLANGIOGRAM;  Surgeon: Gladis Cough, MD;  Location: [facility] ORS;  Service: General;  Laterality: N/A;   LAPAROSCOPY  2009   nsvd  2018,2016    x 2    Family History  Problem Relation Age of Onset   Diabetes Father    Asthma Father    Hypertension Father    Other Brother    Heart disease Maternal Grandmother    Colon cancer Maternal Grandmother    Breast cancer Maternal Grandmother    Pancreatic cancer Paternal Grandmother    Heart disease Paternal Grandfather    Thyroid  disease Neg Hx     No Known Allergies  Current Outpatient Medications on File Prior to Visit  Medication Sig Dispense Refill   Accu-Chek Softclix Lancets lancets 1 each by Other route 4 (four) times daily. 100 each 12   albuterol  (PROAIR  HFA) 108 (90 Base) MCG/ACT inhaler Inhale 2 puffs into the lungs every 6 (six) hours as needed for wheezing or shortness of breath. 1 each 0   diltiazem  (CARDIZEM ) 30 MG tablet Take 1 tablet (30 mg total) by mouth 2 (two) times daily as needed. 60 tablet 11   glucose blood (ACCU-CHEK SMARTVIEW) test strip Use as instructed to check blood sugars 100 each 12   norgestimate -ethinyl estradiol  (ESTARYLLA) 0.25-35 MG-MCG tablet Take 1 tablet by mouth daily. 84 tablet 3   No current facility-administered medications on file prior to visit.    BP 112/70   Pulse 91   Temp 97.8 F (36.6 C) (Temporal)   Ht 5' 5 (1.651 m)   Wt 212 lb (96.2 kg)   LMP 02/23/2024   SpO2 98%   BMI 35.28 kg/m  Objective:   Physical Exam Cardiovascular:     Rate and Rhythm: Normal rate and regular rhythm.  Pulmonary:     Effort: Pulmonary effort is normal.     Breath sounds: Normal breath sounds.  Musculoskeletal:     Cervical back: Neck supple.  Skin:    General: Skin is warm and dry.  Neurological:     Mental Status: She is alert and oriented to person, place, and time.  Psychiatric:        Mood and Affect: Mood normal.     Physical Exam        Assessment & Plan:  Lightheadedness Assessment & Plan: Differentials include anemia, PE, thyroid  abnormality, hyperglycemia.  Labs pending today including D-dimer, TSH, A1c,  ferritin, CBC, CMP. Consider pelvic/transvaginal ultrasound if anemic given abnormalities with menstrual cycle.  Consider echocardiogram.  Await results.  Orders: -     CBC -     Ferritin -     Comprehensive metabolic panel with GFR -     Vitamin B12 -     VITAMIN D  25 Hydroxy (Vit-D Deficiency, Fractures) -     D-dimer, quantitative  Hypothyroidism, unspecified type Assessment & Plan: Will update TSH today. Remain off treatment.  Orders: -     TSH  Prediabetes Assessment & Plan: Repeat A1c pending.  Orders: -     Hemoglobin A1c  Palpitation  Assessment & Plan: Controlled.  Continue diltiazem  30 mg if needed.     Assessment and Plan Assessment & Plan         Diane MARLA Gaskins, NP    History of Present Illness

## 2024-03-02 NOTE — Assessment & Plan Note (Addendum)
 Differentials include anemia, PE, thyroid  abnormality, hyperglycemia.  Labs pending today including D-dimer, TSH, A1c, ferritin, CBC, CMP. Consider pelvic/transvaginal ultrasound if anemic given abnormalities with menstrual cycle.  Consider echocardiogram.  Await results.

## 2024-03-02 NOTE — Telephone Encounter (Signed)
 Diane [name] lab called with Critical labs.  Pt has a Hemoglobin of 7.0  Results given  to PCP

## 2024-03-02 NOTE — Assessment & Plan Note (Signed)
 Controlled.  Continue diltiazem  30 mg if needed.

## 2024-03-02 NOTE — Assessment & Plan Note (Signed)
 Repeat A1c pending

## 2024-03-02 NOTE — Assessment & Plan Note (Signed)
 Will update TSH today. Remain off treatment.

## 2024-03-02 NOTE — Telephone Encounter (Signed)
 Patient aware of results.  See result note for further information.

## 2024-03-03 ENCOUNTER — Ambulatory Visit: Payer: Self-pay | Admitting: Primary Care

## 2024-03-03 ENCOUNTER — Ambulatory Visit
Admission: RE | Admit: 2024-03-03 | Discharge: 2024-03-03 | Disposition: A | Discharge status: Home / Self Care | Source: Ambulatory Visit | Attending: Primary Care | Admitting: Primary Care

## 2024-03-03 ENCOUNTER — Ambulatory Visit

## 2024-03-03 ENCOUNTER — Other Ambulatory Visit: Payer: Self-pay | Admitting: Primary Care

## 2024-03-03 ENCOUNTER — Telehealth: Payer: Self-pay

## 2024-03-03 DIAGNOSIS — R0602 Shortness of breath: Secondary | ICD-10-CM | POA: Diagnosis present

## 2024-03-03 DIAGNOSIS — R7989 Other specified abnormal findings of blood chemistry: Secondary | ICD-10-CM | POA: Diagnosis present

## 2024-03-03 LAB — D-DIMER, QUANTITATIVE: D-Dimer, Quant: 0.75 ug{FEU}/mL — ABNORMAL HIGH (ref <0.50)

## 2024-03-03 MED ORDER — IOHEXOL 350 MG/ML SOLN
75.0000 mL | Freq: Once | INTRAVENOUS | Status: AC | PRN
  Administered 2024-03-03: 75 mL via INTRAVENOUS

## 2024-03-03 NOTE — Telephone Encounter (Signed)
 Diane [name]

## 2024-03-03 NOTE — Telephone Encounter (Signed)
 Sending note to MARLA Gaskins NP and Gaskins pool. Sending teams to Kelli CMA

## 2024-03-04 ENCOUNTER — Encounter: Payer: Self-pay | Admitting: Oncology

## 2024-03-04 ENCOUNTER — Other Ambulatory Visit

## 2024-03-04 ENCOUNTER — Ambulatory Visit: Admitting: Clinical

## 2024-03-04 ENCOUNTER — Ambulatory Visit: Admitting: Obstetrics

## 2024-03-04 ENCOUNTER — Telehealth: Payer: Self-pay | Admitting: Primary Care

## 2024-03-04 ENCOUNTER — Encounter: Payer: Self-pay | Admitting: Obstetrics

## 2024-03-04 ENCOUNTER — Other Ambulatory Visit: Payer: Self-pay | Admitting: Obstetrics

## 2024-03-04 ENCOUNTER — Inpatient Hospital Stay: Attending: Oncology | Admitting: Oncology

## 2024-03-04 VITALS — BP 118/80 | HR 92 | Ht 65.0 in | Wt 210.4 lb

## 2024-03-04 VITALS — BP 118/70 | HR 90 | Temp 99.2°F | Resp 18 | Ht 65.0 in | Wt 210.0 lb

## 2024-03-04 DIAGNOSIS — R5383 Other fatigue: Secondary | ICD-10-CM | POA: Diagnosis not present

## 2024-03-04 DIAGNOSIS — Z86711 Personal history of pulmonary embolism: Secondary | ICD-10-CM | POA: Insufficient documentation

## 2024-03-04 DIAGNOSIS — N920 Excessive and frequent menstruation with regular cycle: Secondary | ICD-10-CM | POA: Diagnosis not present

## 2024-03-04 DIAGNOSIS — R0602 Shortness of breath: Secondary | ICD-10-CM | POA: Diagnosis not present

## 2024-03-04 DIAGNOSIS — N939 Abnormal uterine and vaginal bleeding, unspecified: Secondary | ICD-10-CM | POA: Diagnosis not present

## 2024-03-04 DIAGNOSIS — D509 Iron deficiency anemia, unspecified: Secondary | ICD-10-CM | POA: Insufficient documentation

## 2024-03-04 DIAGNOSIS — J45909 Unspecified asthma, uncomplicated: Secondary | ICD-10-CM | POA: Diagnosis not present

## 2024-03-04 DIAGNOSIS — R7989 Other specified abnormal findings of blood chemistry: Secondary | ICD-10-CM

## 2024-03-04 DIAGNOSIS — K219 Gastro-esophageal reflux disease without esophagitis: Secondary | ICD-10-CM | POA: Diagnosis not present

## 2024-03-04 DIAGNOSIS — Z79899 Other long term (current) drug therapy: Secondary | ICD-10-CM | POA: Insufficient documentation

## 2024-03-04 DIAGNOSIS — R911 Solitary pulmonary nodule: Secondary | ICD-10-CM | POA: Insufficient documentation

## 2024-03-04 DIAGNOSIS — Z8249 Family history of ischemic heart disease and other diseases of the circulatory system: Secondary | ICD-10-CM | POA: Diagnosis not present

## 2024-03-04 DIAGNOSIS — Z803 Family history of malignant neoplasm of breast: Secondary | ICD-10-CM | POA: Diagnosis not present

## 2024-03-04 DIAGNOSIS — Z800 Family history of malignant neoplasm of digestive organs: Secondary | ICD-10-CM | POA: Diagnosis not present

## 2024-03-04 MED ORDER — NORETHINDRONE 0.35 MG PO TABS: 1.0000 | ORAL_TABLET | Freq: Every day | ORAL | 11 refills | Status: DC

## 2024-03-04 NOTE — Telephone Encounter (Signed)
 Already addressed

## 2024-03-04 NOTE — Assessment & Plan Note (Addendum)
 Could be secondary to severe anemia. She has a positive D-dimer.  CT chest angiogram examination is limited due to timing of contrast bolus.  There was no definitive evidence of large central pulmonary embolism in the main artery or proximal portion of the left and right pulmonary arteries.  Peripheral pulm embolism cannot be excluded. She does not have any chest pain, lower extremity swelling episodes. Obtain bilateral lower extremity ultrasound. Positive D-dimer, nonspecific.  Can be falsely elevated due to many other etiologies. I recommend to start IV Venofer  treatments and see if shortness of breath gets better Red flag symptoms of PE were discussed with patient.

## 2024-03-04 NOTE — Telephone Encounter (Unsigned)
 Copied from CRM [id number]. Topic: Referral - Question >> Mar 03, 2024 12:43 PM Donna BRAVO wrote: Reason for CRM:  patient calling in asking to have US  Pelvis referral sent to : [facility] [address], [place]  [address] Phone [phone]

## 2024-03-04 NOTE — Progress Notes (Signed)
 Hematology/Oncology Consult note [phone] [phone]        REFERRING PROVIDER: Gretta Comer POUR, NP   CHIEF COMPLAINTS/REASON FOR VISIT:  Evaluation of iron  deficiency anemia.  Positive D-dimer.   ASSESSMENT & PLAN:   Iron  deficiency anemia Lab Results  Component Value Date   HGB 7.0 Repeated and verified X2. (LL) 03/02/2024   FERRITIN 2.1 (L) 03/02/2024    Severe iron  deficiency due to chronic blood loss from menorrhagia. I discussed about option IV Venofer  treatments. I discussed about the potential risks including but not limited to allergic reactions/infusion reactions including anaphylactic reactions, diarrhea, phlebitis, high blood pressure, wheezing, SOB, skin rash, weight gain,dark urine, leg swelling, back pain, headache, nausea and fatigue, etc. patient agrees with the plan.. Plan IV venofer  x 5 doses. Patient is on birth control pill and denies chance of pregnancy.  Shortness of breath Could be secondary to severe anemia. Diane [name] has a positive D-dimer.  CT chest angiogram examination is limited due to timing of contrast bolus.  There was no definitive evidence of large central pulmonary embolism in the main artery or proximal portion of the left and right pulmonary arteries.  Peripheral pulm embolism cannot be excluded. Diane [name] does not have any chest pain, lower extremity swelling episodes. Obtain bilateral lower extremity ultrasound. Positive D-dimer, nonspecific.  Can be falsely elevated due to many other etiologies. I recommend to start IV Venofer  treatments and see if shortness of breath gets better Red flag symptoms of PE were discussed with patient.  Family history of thrombosis Will check factor V Leiden mutation, prothrombin gene mutation and next visit.   Orders Placed This Encounter  Procedures   US  Venous Img Lower Bilateral    Standing Status:   Future    Expected Date:   03/11/2024    Expiration Date:   03/04/2025    Reason for  Exam (SYMPTOM  OR DIAGNOSIS REQUIRED):   D dimer positive    Preferred imaging location?:   [facility]   CBC with Differential (Cancer Center Only)    Standing Status:   Future    Expected Date:   06/03/2024    Expiration Date:   09/01/2024   Iron  and TIBC    Standing Status:   Future    Expected Date:   06/03/2024    Expiration Date:   09/01/2024   Ferritin    Standing Status:   Future    Expected Date:   06/03/2024    Expiration Date:   09/01/2024   Retic Panel    Standing Status:   Future    Expected Date:   06/03/2024    Expiration Date:   09/01/2024   CBC with Differential (Cancer Center Only)    Standing Status:   Future    Expected Date:   04/15/2024    Expiration Date:   07/14/2024   Retic Panel    Standing Status:   Future    Expected Date:   04/15/2024    Expiration Date:   07/14/2024   Factor 5 leiden    Standing Status:   Future    Expected Date:   06/03/2024    Expiration Date:   09/01/2024   Prothrombin gene mutation    Standing Status:   Future    Expected Date:   06/03/2024    Expiration Date:   09/01/2024   Sample to Blood Bank    Standing Status:   Future    Expected Date:   04/15/2024  Expiration Date:   07/14/2024   Follow-up in 3 months. All questions were answered. The patient knows to call the clinic with any problems, questions or concerns.  Zelphia Cap, MD, PhD [facility] Hematology Oncology 03/04/2024   HISTORY OF PRESENTING ILLNESS:   Diane [name] is a  38 y.o.  female with PMH listed below was seen in consultation at the request of  Gretta Comer POUR, NP  for evaluation of iron  deficient anemia, positive D-dimer.  Discussed the use of AI scribe software for clinical note transcription with the patient, who gave verbal consent to proceed.   Recent blood work on 03/02/2024 showed hemoglobin 7, ferritin 2.  Patient reports significant fatigue and lightheadedness. A year ago, her hemoglobin was 11.4.  Diane [name] experiences increasingly heavy  menstrual periods, requiring a Super plus or ultra tampon every 90 minutes. This heavy bleeding contributes to her fatigue and lightheadedness. Diane [name] recently felt faint and weak while at the grocery store with her children, necessitating that Diane [name] sit down.  Diane [name] has been evaluated by a gynecologist, and an ultrasound revealed fibroids, which may be contributing to her heavy menstrual bleeding. Diane [name] was on a birth control pill and was recently switched to a progesterone-only pill.  Patient has shortness of breath.  Patient had elevated D-dimer test, which was conducted to rule out blood clots.  03/03/2024 CT chest angiogram pulmonary embolism protocol showed  1. Limited exam due to issue with timing of contrast bolus. There is no definite evidence of large central pulmonary embolus seen in the main pulmonary artery or the most proximal portions of the left and right pulmonary arteries. Unfortunately, due to timing of the contrast bolus and very limited opacification of the more peripheral branches of the pulmonary arteries, more peripheral pulmonary emboli cannot be excluded on the basis of this exam. 2. 2 mm nodule is noted in right lower lobe. If patient is low risk for malignancy, no routine follow-up imaging is recommended. If patient is high risk for malignancy, a non-contrast chest CT at 12 months is optional.    Diane [name] has a family history of pulmonary embolism, as her father passed away from one unexpectedly.  Diane [name] experiences shortness of breath. No chest pain or palpitations.  Diane [name] has two children.  Diane [name] is accompanied by her husband today.  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 02/2022 Zio: predominantly sinus rhythm @ 85 (53-162). 1 symptomatic run of SVT @ 162 x 16 beats. Rare PACs.   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    SURGICAL HISTORY: Past Surgical History:  Procedure Laterality Date   CHOLECYSTECTOMY N/A 11/28/2017   Procedure: LAPAROSCOPIC CHOLECYSTECTOMY WITH INTRAOPERATIVE CHOLANGIOGRAM;  Surgeon: Gladis Cough, MD;  Location: [facility] ORS;  Service: General;  Laterality: N/A;   LAPAROSCOPY  2009   nsvd  2018,2016   x 2    SOCIAL HISTORY: Social History  Socioeconomic History   Marital status: Married    Spouse name: Not on file   Number of children: Not on file   Years of education: Not on file   Highest education level: Not on file  Occupational History   Not on file  Tobacco Use   Smoking status: Never   Smokeless tobacco: Never  Vaping Use   Vaping status: Never Used  Substance and Sexual Activity   Alcohol use: Yes    Comment: occasional   Drug use: No   Sexual activity: Yes    Birth control/protection: Pill  Other Topics Concern   Not on file  Social History Narrative   Married.   2 children.   Masters in Programme researcher, broadcasting/film/video.   Enjoys reading, photography, crafting.    Social Drivers of Corporate investment banker Strain: Not on file  Food Insecurity: Not on file  Transportation Needs: Not on file  Physical Activity: Not on file  Stress: Not on file  Social Connections: Not on file  Intimate Partner Violence: Not on file    FAMILY HISTORY: Family History  Problem Relation Age of Onset   Diabetes Father    Asthma Father    Hypertension Father    Other Brother    Heart disease Maternal Grandmother    Colon cancer Maternal Grandmother    Breast cancer Maternal Grandmother    Pancreatic cancer Paternal  Grandmother    Heart disease Paternal Grandfather    Thyroid  disease Neg Hx     ALLERGIES:  has no known allergies.  MEDICATIONS:  Current Outpatient Medications  Medication Sig Dispense Refill   Accu-Chek Softclix Lancets lancets 1 each by Other route 4 (four) times daily. 100 each 12   albuterol  (PROAIR  HFA) 108 (90 Base) MCG/ACT inhaler Inhale 2 puffs into the lungs every 6 (six) hours as needed for wheezing or shortness of breath. 1 each 0   ferrous sulfate 325 (65 FE) MG EC tablet Take 325 mg by mouth 3 (three) times daily with meals.     glucose blood (ACCU-CHEK SMARTVIEW) test strip Use as instructed to check blood sugars 100 each 12   norethindrone  (MICRONOR ) 0.35 MG tablet Take 1 tablet (0.35 mg total) by mouth daily. Take at the same time every day. 84 tablet 11   norgestimate -ethinyl estradiol  (ESTARYLLA) 0.25-35 MG-MCG tablet Take 1 tablet by mouth daily. 84 tablet 3   diltiazem  (CARDIZEM ) 30 MG tablet Take 1 tablet (30 mg total) by mouth 2 (two) times daily as needed. (Patient not taking: Reported on 03/04/2024) 60 tablet 11   No current facility-administered medications for this visit.    Review of Systems  Constitutional:  Positive for fatigue. Negative for appetite change, chills and fever.  HENT:   Negative for hearing loss and voice change.   Eyes:  Negative for eye problems.  Respiratory:  Negative for chest tightness and cough.   Cardiovascular:  Negative for chest pain.  Gastrointestinal:  Negative for abdominal distention, abdominal pain and blood in stool.  Endocrine: Negative for hot flashes.  Genitourinary:  Positive for menstrual problem. Negative for difficulty urinating and frequency.   Musculoskeletal:  Negative for arthralgias.  Skin:  Negative for itching and rash.  Neurological:  Negative for extremity weakness.  Hematological:  Negative for adenopathy.  Psychiatric/Behavioral:  Negative for confusion.    PHYSICAL EXAMINATION:  Vitals:   03/04/24  1552  BP: 118/70  Pulse: 90  Resp: 18  Temp: 99.2 F (37.3 C)  SpO2:  100%   Filed Weights   03/04/24 1552  Weight: 210 lb (95.3 kg)    Physical Exam Constitutional:      General: Diane [name] is not in acute distress. HENT:     Head: Normocephalic and atraumatic.  Eyes:     General: No scleral icterus. Cardiovascular:     Rate and Rhythm: Normal rate and regular rhythm.     Heart sounds: Normal heart sounds.  Pulmonary:     Effort: Pulmonary effort is normal. No respiratory distress.     Breath sounds: No wheezing.  Abdominal:     General: Bowel sounds are normal. There is no distension.     Palpations: Abdomen is soft.  Musculoskeletal:        General: No deformity. Normal range of motion.     Cervical back: Normal range of motion and neck supple.  Skin:    General: Skin is warm and dry.     Findings: No erythema or rash.  Neurological:     Mental Status: Diane [name] is alert and oriented to person, place, and time. Mental status is at baseline.     Cranial Nerves: No cranial nerve deficit.  Psychiatric:        Mood and Affect: Mood normal.     LABORATORY DATA:  I have reviewed the data as listed    Latest Ref Rng & Units 03/02/2024    8:44 AM 07/22/2022   11:22 AM 02/19/2022   10:43 AM  CBC  WBC 4.0 - 10.5 K/uL 7.2  5.9  5.9   Hemoglobin 12.0 - 15.0 g/dL 7.0 Repeated and verified X2.  11.4  11.7   Hematocrit 36.0 - 46.0 % 24.4  35.6  36.0   Platelets 150.0 - 400.0 K/uL 553.0  372  301.0       Latest Ref Rng & Units 03/02/2024    8:44 AM 07/22/2022   11:22 AM 09/05/2020    8:24 AM  CMP  Glucose 70 - 99 mg/dL 880  89  94   BUN 6 - 23 mg/dL 13  8  9    Creatinine 0.40 - 1.20 mg/dL 9.37  9.32  9.26   Sodium 135 - 145 mEq/L 137  141  138   Potassium 3.5 - 5.1 mEq/L 4.2  4.0  4.0   Chloride 96 - 112 mEq/L 102  104  102   CO2 19 - 32 mEq/L 28  23  29    Calcium 8.4 - 10.5 mg/dL 9.0  8.9  9.1   Total Protein 6.0 - 8.3 g/dL 7.0  7.1  6.9   Total Bilirubin 0.2 - 1.2 mg/dL 0.4   <9.7  0.3   Alkaline Phos 39 - 117 U/L 70  81  54   AST 0 - 37 U/L 10  12  8    ALT 0 - 35 U/L 6  5  8        RADIOGRAPHIC STUDIES: I have personally reviewed the radiological images as listed and agreed with the findings in the report. CT Angio Chest Pulmonary Embolism (PE) W or WO Contrast Result Date: 03/03/2024 CLINICAL DATA:  Shortness of breath, positive D-dimer. EXAM: CT ANGIOGRAPHY CHEST WITH CONTRAST TECHNIQUE: Multidetector CT imaging of the chest was performed using the standard protocol during bolus administration of intravenous contrast. Multiplanar CT image reconstructions and MIPs were obtained to evaluate the vascular anatomy. RADIATION DOSE REDUCTION: This exam was performed according to the departmental dose-optimization program which includes automated exposure control, adjustment of the  mA and/or kV according to patient size and/or use of iterative reconstruction technique. CONTRAST:  75mL OMNIPAQUE  IOHEXOL  350 MG/ML SOLN COMPARISON:  None Available. FINDINGS: Cardiovascular: Unfortunately, there is very limited opacification of the peripheral branches of both pulmonary arteries. There is no evidence of large central pulmonary embolus seen in the main pulmonary artery or the most proximal portions of the left and right pulmonary arteries. Unfortunately, due to timing of the contrast bolus and very limited opacification of the more peripheral branches of the pulmonary arteries, more peripheral pulmonary emboli cannot be excluded on the basis of this exam. The thoracic aorta is well opacified without evidence of dissection or aneurysm. Normal cardiac size. No pericardial effusion. Mediastinum/Nodes: No enlarged mediastinal, hilar, or axillary lymph nodes. Thyroid  gland, trachea, and esophagus demonstrate no significant findings. Lungs/Pleura: No pneumothorax or pleural effusion is noted. 2 mm nodule is noted anteriorly in right lower lobe best seen on image number 74 series 7. No  consolidative process is noted. Upper Abdomen: No acute abnormality. Musculoskeletal: No chest wall abnormality. No acute or significant osseous findings. Review of the MIP images confirms the above findings. IMPRESSION: 1. Limited exam due to issue with timing of contrast bolus. There is no definite evidence of large central pulmonary embolus seen in the main pulmonary artery or the most proximal portions of the left and right pulmonary arteries. Unfortunately, due to timing of the contrast bolus and very limited opacification of the more peripheral branches of the pulmonary arteries, more peripheral pulmonary emboli cannot be excluded on the basis of this exam. 2. 2 mm nodule is noted in right lower lobe. If patient is low risk for malignancy, no routine follow-up imaging is recommended. If patient is high risk for malignancy, a non-contrast chest CT at 12 months is optional.This recommendation follows the consensus statement: Guidelines for Management of Incidental Pulmonary Nodules Detected on CT Images: From the Fleischner Society 2017; Radiology 2017; [phone]. Electronically Signed   By: Lynwood Landy Raddle M.D.   On: 03/03/2024 15:16

## 2024-03-04 NOTE — Assessment & Plan Note (Signed)
 Will check factor V Leiden mutation, prothrombin gene mutation and next visit.

## 2024-03-04 NOTE — Progress Notes (Signed)
 Patient just had an US  today, the provider wanted to know if Dr. Babara could maybe look/review the scan. Patient was recently diagnosed with fibroids. She is having shortness of breath with exertion. Weakness and fatigued.

## 2024-03-04 NOTE — Progress Notes (Signed)
   GYN ENCOUNTER  Subjective  HPI: Diane [name] is a 38 y.o. [id number] who presents today for heavy menstrual bleeding and anemia. She reports that she has always had heavy menstrual bleeding, but it has increased significantly over the past 6-8 months, with large clots. She been taking COCs for years.  She feels her concerns have not been adequately addressed when she brought this issue up multiple times in the past. She went to her PCP for SOB and heavy bleeding and was found to be severely anemic (hgb 7.0; hgb over the past 3 years has been 11.-12.4). She had a stat chest CT for an elevated D-dimer; results were inconclusive d/t timing of contrast bolus. 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 02/2022 Zio: predominantly sinus rhythm @ 85 (53-162). 1 symptomatic run of SVT @ 162 x 16 beats. Rare PACs.   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   Past Surgical History:  Procedure Laterality Date   CHOLECYSTECTOMY N/A 11/28/2017   Procedure: LAPAROSCOPIC CHOLECYSTECTOMY WITH INTRAOPERATIVE CHOLANGIOGRAM;  Surgeon: Gladis Cough, MD;  Location: [facility] ORS;  Service: General;  Laterality: N/A;   LAPAROSCOPY  2009   nsvd  2018,2016   x 2   OB History     Gravida  2   Para  2   Term  2   Preterm      AB      Living  2      SAB      IAB      Ectopic      Multiple      Live Births  2          No Known Allergies  ROS: See  HPI  Objective  BP 118/80   Pulse 92   Ht 5' 5 (1.651 m)   Wt 210 lb 6.4 oz (95.4 kg)   LMP 02/23/2024   BMI 35.01 kg/m   Physical examination Pelvic: Declined, TVUS done today  Preliminary US  report shows 2  fibroids, 6 cm and 2 cm  Assessment Uterine fibroids Heavy menstrual bleeding Anemia  Plan 1) Discussed options for bleeding management, including IUD (if appropriate based on final US  report on location/type of fibroid), UFE, myomectomy, hysterectomy, POPs, or another progestin. Recommend discontinuing COCs since it is not clear if she has pulmonary emobli from the CT report in the setting of elevated D-dimer. Randine would like to try POPs. Discussed proper administration, potential side effects, and expected bleeding pattern. Will f/u when US  report is available.   Gavon Majano, CNM

## 2024-03-04 NOTE — Assessment & Plan Note (Addendum)
 Lab Results  Component Value Date   HGB 7.0 Repeated and verified X2. (LL) 03/02/2024   FERRITIN 2.1 (L) 03/02/2024    Severe iron  deficiency due to chronic blood loss from menorrhagia. I discussed about option IV Venofer  treatments. I discussed about the potential risks including but not limited to allergic reactions/infusion reactions including anaphylactic reactions, diarrhea, phlebitis, high blood pressure, wheezing, SOB, skin rash, weight gain,dark urine, leg swelling, back pain, headache, nausea and fatigue, etc. patient agrees with the plan.. Plan IV venofer  x 5 doses. Patient is on birth control pill and denies chance of pregnancy.

## 2024-03-04 NOTE — Addendum Note (Signed)
 Addended by: BABARA CALL on: 03/04/2024 09:43 PM   Modules accepted: Orders

## 2024-03-05 ENCOUNTER — Ambulatory Visit

## 2024-03-05 ENCOUNTER — Ambulatory Visit: Admission: RE | Admit: 2024-03-05 | Source: Ambulatory Visit

## 2024-03-05 VITALS — BP 114/77 | HR 85 | Temp 98.9°F | Resp 18

## 2024-03-05 DIAGNOSIS — D509 Iron deficiency anemia, unspecified: Secondary | ICD-10-CM | POA: Diagnosis not present

## 2024-03-05 MED ORDER — IRON SUCROSE 20 MG/ML IV SOLN
200.0000 mg | Freq: Once | INTRAVENOUS | Status: AC
  Administered 2024-03-05: 200 mg via INTRAVENOUS

## 2024-03-05 NOTE — Telephone Encounter (Signed)
Noted. Glad to hear

## 2024-03-05 NOTE — Patient Instructions (Signed)

## 2024-03-05 NOTE — Telephone Encounter (Signed)
 Spoke with patient she confirmed she had this US  done in GYN office yesterday, this one can be canceled.   She wanted to relay to Mallie that she had her first of 5 iron  infusions yesterday at the hematology office.

## 2024-03-05 NOTE — Telephone Encounter (Signed)
 Please call patient to verify that she no longer needs the ultrasound ordered.  From my understanding they completed this yesterday at her GYN office.

## 2024-03-09 ENCOUNTER — Inpatient Hospital Stay

## 2024-03-09 VITALS — BP 118/68 | HR 80 | Temp 98.8°F | Resp 18

## 2024-03-09 DIAGNOSIS — D509 Iron deficiency anemia, unspecified: Secondary | ICD-10-CM | POA: Diagnosis not present

## 2024-03-09 MED ORDER — IRON SUCROSE 20 MG/ML IV SOLN
200.0000 mg | Freq: Once | INTRAVENOUS | Status: AC
  Administered 2024-03-09: 200 mg via INTRAVENOUS
  Filled 2024-03-09: qty 10

## 2024-03-09 MED ORDER — SODIUM CHLORIDE 0.9% FLUSH
10.0000 mL | Freq: Once | INTRAVENOUS | Status: AC | PRN
  Administered 2024-03-09: 10 mL
  Filled 2024-03-09: qty 10

## 2024-03-11 ENCOUNTER — Inpatient Hospital Stay

## 2024-03-11 ENCOUNTER — Ambulatory Visit
Admission: RE | Admit: 2024-03-11 | Discharge: 2024-03-11 | Disposition: A | Discharge status: Home / Self Care | Source: Ambulatory Visit | Attending: Oncology | Admitting: Oncology

## 2024-03-11 DIAGNOSIS — R7989 Other specified abnormal findings of blood chemistry: Secondary | ICD-10-CM | POA: Diagnosis present

## 2024-03-12 ENCOUNTER — Inpatient Hospital Stay

## 2024-03-12 ENCOUNTER — Ambulatory Visit: Payer: Self-pay | Admitting: Oncology

## 2024-03-12 VITALS — BP 122/72 | HR 80 | Temp 99.3°F | Resp 18

## 2024-03-12 DIAGNOSIS — D509 Iron deficiency anemia, unspecified: Secondary | ICD-10-CM

## 2024-03-12 MED ORDER — IRON SUCROSE 20 MG/ML IV SOLN
200.0000 mg | Freq: Once | INTRAVENOUS | Status: AC
  Administered 2024-03-12: 200 mg via INTRAVENOUS
  Filled 2024-03-12: qty 10

## 2024-03-12 MED ORDER — SODIUM CHLORIDE 0.9% FLUSH
10.0000 mL | Freq: Once | INTRAVENOUS | Status: AC | PRN
  Administered 2024-03-12: 10 mL
  Filled 2024-03-12: qty 10

## 2024-03-15 ENCOUNTER — Inpatient Hospital Stay

## 2024-03-15 VITALS — BP 122/76 | HR 89 | Temp 97.6°F | Resp 18

## 2024-03-15 DIAGNOSIS — D509 Iron deficiency anemia, unspecified: Secondary | ICD-10-CM | POA: Diagnosis not present

## 2024-03-15 MED ORDER — IRON SUCROSE 20 MG/ML IV SOLN
200.0000 mg | Freq: Once | INTRAVENOUS | Status: AC
  Administered 2024-03-15: 200 mg via INTRAVENOUS
  Filled 2024-03-15: qty 10

## 2024-03-15 NOTE — Progress Notes (Signed)
 Patient tolerated Venofer  without any complications. Patient declined staying for 30 minute post observation period. She is aware to go to  ED for any signs of allergic reaction.

## 2024-03-19 ENCOUNTER — Ambulatory Visit: Admitting: Clinical

## 2024-03-19 ENCOUNTER — Other Ambulatory Visit (HOSPITAL_COMMUNITY)
Admission: RE | Admit: 2024-03-19 | Discharge: 2024-03-19 | Disposition: A | Discharge status: Home / Self Care | Source: Ambulatory Visit | Attending: Licensed Practical Nurse | Admitting: Licensed Practical Nurse

## 2024-03-19 ENCOUNTER — Ambulatory Visit

## 2024-03-19 VITALS — BP 121/73 | HR 78 | Ht 65.0 in | Wt 207.3 lb

## 2024-03-19 DIAGNOSIS — F4321 Adjustment disorder with depressed mood: Secondary | ICD-10-CM | POA: Diagnosis not present

## 2024-03-19 DIAGNOSIS — N898 Other specified noninflammatory disorders of vagina: Secondary | ICD-10-CM

## 2024-03-19 DIAGNOSIS — F411 Generalized anxiety disorder: Secondary | ICD-10-CM | POA: Diagnosis not present

## 2024-03-19 DIAGNOSIS — Z113 Encounter for screening for infections with a predominantly sexual mode of transmission: Secondary | ICD-10-CM

## 2024-03-19 NOTE — Progress Notes (Signed)
   Darice Seats, LCSW

## 2024-03-19 NOTE — Patient Instructions (Signed)
  Vaginitis You will learn about the causes symptoms, and treatment for the 3 main types of vaginitis; vaginosis, yeast infection and trichomoniasis. To view the content, go to this web address: [email or web address]  This video will expire on: 06/04/2025. If you need access to this video following this date, please reach out to the healthcare provider who assigned it to you. This information is not intended to replace advice given to you by your health care provider. Make sure you discuss any questions you have with your health care provider. Elsevier Patient Education  2024 [employer].

## 2024-03-19 NOTE — Progress Notes (Cosign Needed)
    NURSE VISIT NOTE  Subjective:    Patient ID: Diane [name], female    DOB: 02-07-1986, 38 y.o.   MRN: [id number]  HPI  Patient is a 38 y.o. [id number] female who presents for translucent yellow vaginal discharge for 4-5 day(s). Denies abnormal vaginal bleeding or significant pelvic pain or fever. reports vaginal discharge. Patient has and denies history of known exposure to STD.   Objective:    LMP 03/07/2024 (Approximate)    @THIS  VISIT ONLY@  Assessment:   1. Vaginal discharge     nonspecific vaginitis patient declined STD testing  Plan:   GC and chlamydia DNA  probe sent to lab, patient request note be sent to Eleanor Canny for review.  ROV prn if symptoms persist or worsen.   Rollo JINNY Maxin, CMA

## 2024-03-19 NOTE — Progress Notes (Signed)
 [facility] Counselor/Therapist Progress Note  Patient ID: Diane [name], MRN: [id number],    Date: 03/19/2024  Time Spent: 9:35am - 10:30am : 55 minutes   Treatment Type: Individual Therapy  Reported Symptoms: sadness, guilt  Mental Status Exam: Appearance:  Neat and Well Groomed     Behavior: Appropriate  Motor: Normal  Speech/Language:  Clear and Coherent and Normal Rate  Affect: Tearful  Mood: sad  Thought process: normal  Thought content:   WNL  Sensory/Perceptual disturbances:   WNL  Orientation: oriented to person, place, time/date, and situation  Attention: Good  Concentration: Good  Memory: WNL  Fund of knowledge:  Good  Insight:   Good  Judgment:  Good  Impulse Control: Good   Risk Assessment: Danger to Self:  No Patient denied current suicidal ideation  Self-injurious Behavior: No Danger to Others: No Patient denied current homicidal ideation Duty to Warn:no Physical Aggression / Violence:No  Access to Firearms a concern: No  Gang Involvement:No   Subjective: Patient stated, its been a lot, I've been dealing with a lot of health stuff lately, it sprung up suddenly, its been a lot to deal with.  Patient reported patient was told patient was anemic and patient's hemoglobin levels were low. Patient reported patient has been receiving iron  infusions as a result. Patient reported patient was advised patient was anemic was due to fibroids. Patient stated, for someone that deals with a lot of medical anxiety it was really a lot to deal with in reference to recent events. Patient reported feeling considerably better than I was in response to current mood. Patient stated, It was really frustrating. Patient stated, its been a struggle beyond the physical stuff and the mental side. Patient stated, It felt very crisis like in the moment in reference to recent medical symptoms. Patient reported patient was sent for an immediate chest CT to  evaluate for blood clots in lungs. Patient stated, Its just been a lot. Patient reported patient's father's birthday was yesterday and patient brother got engaged since last session. Patient stated,  I did not process in my brain that it was his birthday until late in the afternoon and reported feeling guilty for not acknowledging father's birthday earlier in the day. Patient reported recent conflict with brother regarding brother's decision to refrain from inviting specific family members to the wedding and reported brother uninvited patient to wedding. Patient stated, I'm kind of at a point to stop always repairing things I didn't break and stated, I'm tired of being the bigger person all the time in reference to conflict with brother. Patient stated, I've tried to set some kind of a boundary in reference to brother. Patient stated, I leaned a lot on Chauncey (husband) and my mom. Patient stated, did my very best not to Google anything or look at anything online, it heightened all those fears that I've always had in reference to recent medical symptoms. Patient stated, Its been better the last few days in response to mood.   Interventions: Cognitive Behavioral Therapy. Clinician conducted session via caregility video from clinician's home office. Patient provided verbal consent to proceed with telehealth session and is aware of limitations of telephone or video visits. Patient participated in session from patient's home. Provided supportive therapy, active listening, and validation as patient discussed recent medical symptoms/events and patient's thoughts/feelings in response. Discussed patient's response to father's birthday and explored feelings of guilt. Challenged statements/thoughts to assist patient in reframing negative thought patterns. Reflected back to patient  observations in thought patterns/cognitive distortions of disqualifying the positives. Processed patient's thoughts/feelings in  response to conflict with brother. Discussed establishing boundaries. Discussed concept of projection and deflection. Explored coping strategies patient utilized in response to recent medical symptoms and the outcome. Clinician requested for homework patient continue thought record and practice behavioral activation and opposition action.    Collaboration of Care: not required at this time   Diagnosis:  Generalized anxiety disorder   Adjustment disorder with depressed mood    Plan: Patient is to utilize [employer] Therapy, thought re-framing, behavioral activation, mindfulness and coping strategies to decrease symptoms associated with their diagnosis. Frequency: bi-weekly  Modality: individual      Long-term goal:   Reduce overall level, frequency, and intensity of the feelings of anxiety as evidenced by decreased worrying about worse case scenario situations, irritability, overstimulation by noises in the environment, my mind just starts spiraling, nausea when feeling anxious, shaky feeling, feeling on edge, lack of motivation when anxious, difficulty concentrating, fatigue and increased sleep when feeling anxious from 6 to 7 days/week to 0 to 1 days/week per patient report for at least 3 consecutive months. Target Date: 11/03/24  Progress: progressing    Short-term goal:  Develop and implement mindfulness strategies to increase patient being present when spending time with family and other activities Target Date: 11/23/24  Progress: progressing    Increase and maintain daily self care strategies, such as, drinking water, eating 3 healthy meals per day, healthy sleep habits, exercising, and work/life balance Target Date: 11/23/24  Progress: progressing    Develop and implement coping strategies and effective stress management strategies to decrease feelings of irritability and negative responses to stress Target Date: 11/23/24  Progress: progressing    Identify, challenge, and  replace negative thought patterns that contribute to feelings of guilt and anxiety with positive thoughts and beliefs per patient's report Target Date: 11/23/24  Progress: progressing    Begin healthy grieving process Target Date: 11/23/24  Progress: progressing    Darice Seats, LCSW

## 2024-03-22 ENCOUNTER — Ambulatory Visit: Payer: Self-pay | Admitting: Obstetrics

## 2024-03-23 ENCOUNTER — Inpatient Hospital Stay

## 2024-03-23 VITALS — BP 119/64 | HR 81 | Temp 97.2°F | Resp 18

## 2024-03-23 DIAGNOSIS — D509 Iron deficiency anemia, unspecified: Secondary | ICD-10-CM

## 2024-03-23 LAB — CERVICOVAGINAL ANCILLARY ONLY
Bacterial Vaginitis (gardnerella): NEGATIVE
Candida Glabrata: NEGATIVE
Candida Vaginitis: NEGATIVE
Comment: NEGATIVE
Comment: NEGATIVE
Comment: NEGATIVE

## 2024-03-23 MED ORDER — IRON SUCROSE 20 MG/ML IV SOLN
200.0000 mg | Freq: Once | INTRAVENOUS | Status: AC
  Administered 2024-03-23: 200 mg via INTRAVENOUS
  Filled 2024-03-23: qty 10

## 2024-03-23 NOTE — Patient Instructions (Signed)

## 2024-03-24 ENCOUNTER — Ambulatory Visit: Payer: Self-pay | Admitting: Obstetrics

## 2024-03-26 ENCOUNTER — Other Ambulatory Visit: Payer: Self-pay | Admitting: Obstetrics

## 2024-03-26 ENCOUNTER — Encounter: Payer: Self-pay | Admitting: Obstetrics

## 2024-03-26 DIAGNOSIS — D250 Submucous leiomyoma of uterus: Secondary | ICD-10-CM

## 2024-03-26 MED ORDER — MEDROXYPROGESTERONE ACETATE 10 MG PO TABS
10.0000 mg | ORAL_TABLET | Freq: Three times a day (TID) | ORAL | 0 refills | Status: DC
Start: 2024-03-26 — End: 2024-04-07

## 2024-03-26 NOTE — Progress Notes (Signed)
 Rx sent for Provera 10 mg TID x 10 days

## 2024-03-29 ENCOUNTER — Encounter: Payer: Self-pay | Admitting: Oncology

## 2024-03-29 ENCOUNTER — Other Ambulatory Visit: Payer: Self-pay | Admitting: Obstetrics

## 2024-03-29 ENCOUNTER — Telehealth: Payer: Self-pay | Admitting: Obstetrics

## 2024-03-29 MED ORDER — ONDANSETRON 4 MG PO TBDP: 4.0000 mg | ORAL_TABLET | Freq: Three times a day (TID) | ORAL | 1 refills | Status: DC | PRN

## 2024-03-29 NOTE — Telephone Encounter (Signed)
 Called Diane [name] to discuss her heavy bleeding. She began her period last Thursday and it became very heavy on Friday (bleeding through ultra tampon q 30 min, ping-pong ball sized clots)l this lasted  for 2.5 days and then tapered off to spotting. She reports that this is heavier than her periods were on COCs. She noticed that the bleeding got lighter when she was taking Advil . She is wondering if she should continue POPs or switch to Provera as discussed in a previous message.  Would recommend continuing POP for now; take ibuprofen  800 mg TID for the first 3-5 days of her next period. If still bleeding that excessively, would recommend Provera 10 mg TID. She has an appt with MD at [facility] on 04/07/24 to discuss all her options.   Jacaden Forbush M Odies Desa, CNM

## 2024-04-05 ENCOUNTER — Ambulatory Visit: Admitting: Family Medicine

## 2024-04-07 ENCOUNTER — Ambulatory Visit: Admitting: Obstetrics & Gynecology

## 2024-04-07 ENCOUNTER — Encounter: Payer: Self-pay | Admitting: Obstetrics & Gynecology

## 2024-04-07 ENCOUNTER — Telehealth (HOSPITAL_BASED_OUTPATIENT_CLINIC_OR_DEPARTMENT_OTHER): Payer: Self-pay | Admitting: Obstetrics & Gynecology

## 2024-04-07 VITALS — BP 118/71 | HR 89 | Ht 65.0 in | Wt 206.0 lb

## 2024-04-07 DIAGNOSIS — Z01419 Encounter for gynecological examination (general) (routine) without abnormal findings: Secondary | ICD-10-CM

## 2024-04-07 DIAGNOSIS — D250 Submucous leiomyoma of uterus: Secondary | ICD-10-CM | POA: Diagnosis not present

## 2024-04-07 MED ORDER — MEDROXYPROGESTERONE ACETATE 10 MG PO TABS: 20.0000 mg | ORAL_TABLET | Freq: Every day | ORAL | 2 refills | Status: DC

## 2024-04-07 NOTE — Progress Notes (Signed)
 Patient ID: Diane [name], female   DOB: 1985/10/15, 38 y.o.   MRN: [id number]  Chief Complaint  Patient presents with   New Patient (Initial Visit)    GYN CONSULT    HPI Diane [name] is a 38 y.o. female.  [id number] Patient's last menstrual period was 03/25/2024 (approximate). Her menses have been getting heavier and recent U'S showed submucosal fibroids. Referred by [facility]. She is current Rx Provera 10 mf TId for 10 days no other BCM. 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 02/2022 Zio: predominantly sinus rhythm @ 85 (53-162). 1 symptomatic run of SVT @ 162 x 16 beats. Rare PACs.   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    Past Surgical History:  Procedure Laterality Date   CHOLECYSTECTOMY N/A 11/28/2017   Procedure: LAPAROSCOPIC CHOLECYSTECTOMY WITH INTRAOPERATIVE CHOLANGIOGRAM;  Surgeon: Gladis Cough, MD;  Location: [facility] ORS;  Service: General;  Laterality: N/A;   LAPAROSCOPY  2009   nsvd  2018,2016   x 2    Family History  Problem Relation Age of Onset   Diabetes Father    Asthma Father    Hypertension Father    Other Brother    Cancer Maternal Grandmother    Heart disease Maternal Grandmother    Colon cancer Maternal Grandmother    Breast cancer Maternal Grandmother    Cancer Paternal Grandmother    Pancreatic cancer Paternal Grandmother    Heart  disease Paternal Grandfather    Thyroid  disease Neg Hx     Social History Social History   Tobacco Use   Smoking status: Never   Smokeless tobacco: Never  Vaping Use   Vaping status: Never Used  Substance Use Topics   Alcohol use: Yes    Comment: occasional   Drug use: No    No Known Allergies  Current Outpatient Medications  Medication Sig Dispense Refill   Accu-Chek Softclix Lancets lancets 1 each by Other route 4 (four) times daily. 100 each 12   albuterol  (PROAIR  HFA) 108 (90 Base) MCG/ACT inhaler Inhale 2 puffs into the lungs every 6 (six) hours as needed for wheezing or shortness of breath. 1 each 0   diltiazem  (CARDIZEM ) 30 MG tablet Take 1 tablet (30 mg total) by mouth 2 (two) times daily as needed. (Patient not taking: Reported on 03/04/2024) 60 tablet 11   ferrous sulfate 325 (65 FE) MG EC tablet Take 325 mg by mouth 3 (three) times daily with meals.     glucose blood (ACCU-CHEK SMARTVIEW) test strip Use as instructed to check blood sugars 100 each 12   medroxyPROGESTERone (PROVERA) 10 MG tablet Take 2 tablets (20 mg total) by mouth daily. 60 tablet 2   ondansetron  (ZOFRAN -ODT) 4 MG disintegrating tablet Take 1 tablet (4 mg  total) by mouth every 8 (eight) hours as needed for nausea or vomiting. 30 tablet 1   No current facility-administered medications for this visit.    Review of Systems Review of Systems  Constitutional:  Positive for fatigue.  Genitourinary:  Positive for menstrual problem, pelvic pain and vaginal bleeding.    Blood pressure 118/71, pulse 89, height 5' 5 (1.651 m), weight 206 lb (93.4 kg), last menstrual period 03/25/2024.  Physical Exam Physical Exam Constitutional:      Appearance: Normal appearance.  Cardiovascular:     Rate and Rhythm: Normal rate.  Pulmonary:     Effort: Pulmonary effort is normal.  Abdominal:     General: Abdomen is flat.     Palpations: Abdomen is soft.  Musculoskeletal:     Cervical back: Normal range of  motion.  Skin:    General: Skin is warm and dry.     Coloration: Skin is not pale.  Neurological:     General: No focal deficit present.     Mental Status: She is alert.     Data Reviewed arrative & Impression     GYN ULTRASOUND REPORT     Patient Name: Diane [name] DOB: July 17, 1985 MRN: [id number] LMP:      Location: [facility] Date of Service: 03/04/2024 Ordering Provider: Eleanor Canny, CNM   Indications:Abnormal Uterine Bleeding Findings:  The uterus is anteverted and measures 9.1 x 5.7 x 7.4 cm with a uterine volume of 197.34 ml. Echo texture is heterogenous with evidence of focal masses. Within the uterus are multiple suspected fibroids. The largest two measure:   Fibroid 1:(Submucosal): 6.7 x 3.8 x 5.2 cm. Fibroid 2:(Submucosal): 2.0 x 2.1 x 2.3 cm.     The Endometrium was not able to be seen due to large submucosal fibroids.   Right Ovary measures 2.6 x 2.0 x 2.0 cm. It is normal in appearance. Left Ovary measures 2.6 x 1.5 x 1.6 cm. It is normal in appearance. Survey of the adnexa demonstrates no adnexal masses. There is no free fluid in the cul de sac.   Impression: 1. Enlarged, fibroid uterus seen.   Recommendations: 1.Clinical correlation with the patient's History and Physical Exam.   Diane [name], RDMS (AB,OB,BR),RVT   Clinical Impression and recommendations:   I have reviewed the sonogram results above, combined with the patient's current clinical course, below are my impressions and any appropriate recommendations for management based on the sonographic findings.    Enlarged uterus with two submucosal fibroids, measurements as follows: - Fibroid 1:(Submucosal): 6.7 x 3.8 x 5.2 cm. - Fibroid 2:(Submucosal): 2.0 x 2.1 x 2.3 cm   2. Endometrium not well visualized due to uterine fibroids 3. Normal ovaries bilaterally     Diane [name] 03/22/2024 6:21 PM      Assessment Fibroids, submucosal - Plan: US   Sonohysterogram, medroxyPROGESTERone (PROVERA) 10 MG tablet   Plan 6 week f/u [facility] at [facility] for possible myomectomy or fibroid ablation     Lynwood Solomons 04/07/2024, 9:03 AM

## 2024-04-07 NOTE — Progress Notes (Signed)
 38 y.o. New GYN presents for Surgical Consult.   Last PAP 07/05/2021 NILM

## 2024-04-07 NOTE — Progress Notes (Signed)
 Diane [name]

## 2024-04-07 NOTE — Telephone Encounter (Signed)
 Scheduled Diane [name]'s [facility] appointment for 04/28/24 at 3:00 PM. Informed her of the $100 no-show fee applicable if the appointment is missed or canceled with less than 24 hours' notice. She verbalized understanding.

## 2024-04-09 ENCOUNTER — Ambulatory Visit: Admitting: Clinical

## 2024-04-09 ENCOUNTER — Other Ambulatory Visit: Payer: Self-pay | Admitting: Primary Care

## 2024-04-09 DIAGNOSIS — F411 Generalized anxiety disorder: Secondary | ICD-10-CM | POA: Diagnosis not present

## 2024-04-09 DIAGNOSIS — F4321 Adjustment disorder with depressed mood: Secondary | ICD-10-CM

## 2024-04-09 DIAGNOSIS — R7989 Other specified abnormal findings of blood chemistry: Secondary | ICD-10-CM

## 2024-04-09 NOTE — Progress Notes (Signed)
 [facility] Counselor/Therapist Progress Note  Patient ID: Diane [name], MRN: [id number],    Date: 04/09/2024  Time Spent: 9:33am - 10:24am : 51 minutes  Treatment Type: Individual Therapy  Reported Symptoms: anxiety  Mental Status Exam: Appearance:  Neat and Well Groomed     Behavior: Appropriate  Motor: Normal  Speech/Language:  Clear and Coherent and Normal Rate  Affect: Appropriate  Mood: normal  Thought process: normal  Thought content:   WNL  Sensory/Perceptual disturbances:   WNL  Orientation: oriented to person, place, time/date, and situation  Attention: Good  Concentration: Good  Memory: WNL  Fund of knowledge:  Good  Insight:   Good  Judgment:  Good  Impulse Control: Good   Risk Assessment: Danger to Self:  No Patient denied current suicidal ideation  Self-injurious Behavior: No Danger to Others: No Patient denied current homicidal ideation Duty to Warn:no Physical Aggression / Violence:No  Access to Firearms a concern: No  Gang Involvement:No   Subjective: Patient stated, its been ok in response to events since last session. Patient stated, pretty good in response to mood since last session. Patient reported feeling anxiety and reported that unsettled feeling over the past several days. Patient reported a recent appointment with a new medical provider and stated, that was validating in reference to provider's feedback. Patient reported patient does not feel she can trust herself or medical providers as it relates to medical symptoms/issues due to recent events. Patient stated, I feel less confident when I'm told everything is normal, I don't believe them (providers). Patient stated, I spoke to multiple doctors over multiple years and was not listened to. Patient stated, Im good at that one in reference to cognitive distortion of over generalization. Patient stated, Its very possible its been all of it piling, its just that its  overwhelming in reference to recent triggers and ambivalence as it relates to a plan to address medical issues/concerns. Patient reported patient is currently managing patient's diet due to recent A1C results and reported feeling patient's diet is a component patient can control. Patient stated, I'm good in response to current mood.   Interventions: Cognitive Behavioral Therapy. Clinician conducted session via caregility video from clinician's home office. Patient provided verbal consent to proceed with telehealth session and is aware of limitations of telephone or video visits. Patient participated in session from patient's home. Reviewed events since last session and assessed for changes. Assisted patient in exploring triggers for recent feelings of anxiety and feeling unsettled. Discussed recent medical appointments and status of treatment. Explored feelings of distrust. Reviewed challenging negative thoughts patterns/worry using socratic questions. Provided psycho education related to cognitive distortions. Provided psycho education related to grounding exercises. Clinician requested for homework patient continue thought record and practice challenging negative thought patterns/cognitive distortions using socratic questioning.     Collaboration of Care: not required at this time   Diagnosis:  Generalized anxiety disorder   Adjustment disorder with depressed mood    Plan: Patient is to utilize [employer] Therapy, thought re-framing, behavioral activation, mindfulness and coping strategies to decrease symptoms associated with their diagnosis. Frequency: bi-weekly  Modality: individual      Long-term goal:   Reduce overall level, frequency, and intensity of the feelings of anxiety as evidenced by decreased worrying about worse case scenario situations, irritability, overstimulation by noises in the environment, my mind just starts spiraling, nausea when feeling anxious, shaky  feeling, feeling on edge, lack of motivation when anxious, difficulty concentrating, fatigue and increased  sleep when feeling anxious from 6 to 7 days/week to 0 to 1 days/week per patient report for at least 3 consecutive months. Target Date: 11/03/24  Progress: progressing    Short-term goal:  Develop and implement mindfulness strategies to increase patient being present when spending time with family and other activities Target Date: 11/23/24  Progress: progressing    Increase and maintain daily self care strategies, such as, drinking water, eating 3 healthy meals per day, healthy sleep habits, exercising, and work/life balance Target Date: 11/23/24  Progress: progressing    Develop and implement coping strategies and effective stress management strategies to decrease feelings of irritability and negative responses to stress Target Date: 11/23/24  Progress: progressing    Identify, challenge, and replace negative thought patterns that contribute to feelings of guilt and anxiety with positive thoughts and beliefs per patient's report Target Date: 11/23/24  Progress: progressing    Begin healthy grieving process Target Date: 11/23/24  Progress: progressing    Darice Seats, LCSW

## 2024-04-09 NOTE — Progress Notes (Signed)
   Darice Seats, LCSW

## 2024-04-13 ENCOUNTER — Ambulatory Visit: Payer: Self-pay | Admitting: Primary Care

## 2024-04-13 ENCOUNTER — Ambulatory Visit
Admission: RE | Admit: 2024-04-13 | Discharge: 2024-04-13 | Disposition: A | Discharge status: Home / Self Care | Source: Ambulatory Visit | Attending: Primary Care | Admitting: Primary Care

## 2024-04-13 DIAGNOSIS — R7989 Other specified abnormal findings of blood chemistry: Secondary | ICD-10-CM | POA: Insufficient documentation

## 2024-04-13 MED ORDER — IOHEXOL 350 MG/ML SOLN
100.0000 mL | Freq: Once | INTRAVENOUS | Status: AC | PRN
Start: 2024-04-13 — End: 2024-04-13
  Administered 2024-04-13: 100 mL via INTRAVENOUS

## 2024-04-14 ENCOUNTER — Inpatient Hospital Stay: Attending: Oncology

## 2024-04-14 ENCOUNTER — Encounter: Payer: Self-pay | Admitting: Oncology

## 2024-04-14 ENCOUNTER — Encounter: Payer: Self-pay | Admitting: Obstetrics & Gynecology

## 2024-04-14 DIAGNOSIS — D509 Iron deficiency anemia, unspecified: Secondary | ICD-10-CM | POA: Insufficient documentation

## 2024-04-14 DIAGNOSIS — R7989 Other specified abnormal findings of blood chemistry: Secondary | ICD-10-CM

## 2024-04-14 LAB — FERRITIN: Ferritin: 29 ng/mL (ref 11–307)

## 2024-04-14 LAB — CBC WITH DIFFERENTIAL (CANCER CENTER ONLY)
Abs Immature Granulocytes: 0.03 K/uL (ref 0.00–0.07)
Basophils Absolute: 0.1 K/uL (ref 0.0–0.1)
Basophils Relative: 1 %
Eosinophils Absolute: 0.2 K/uL (ref 0.0–0.5)
Eosinophils Relative: 4 %
HCT: 32 % — ABNORMAL LOW (ref 36.0–46.0)
Hemoglobin: 9.5 g/dL — ABNORMAL LOW (ref 12.0–15.0)
Immature Granulocytes: 1 %
Lymphocytes Relative: 32 %
Lymphs Abs: 1.6 K/uL (ref 0.7–4.0)
MCH: 21.4 pg — ABNORMAL LOW (ref 26.0–34.0)
MCHC: 29.7 g/dL — ABNORMAL LOW (ref 30.0–36.0)
MCV: 72.2 fL — ABNORMAL LOW (ref 80.0–100.0)
Monocytes Absolute: 0.7 K/uL (ref 0.1–1.0)
Monocytes Relative: 13 %
Neutro Abs: 2.4 K/uL (ref 1.7–7.7)
Neutrophils Relative %: 49 %
Platelet Count: 396 K/uL (ref 150–400)
RBC: 4.43 MIL/uL (ref 3.87–5.11)
RDW: 26.9 % — ABNORMAL HIGH (ref 11.5–15.5)
WBC Count: 4.9 K/uL (ref 4.0–10.5)
nRBC: 0 % (ref 0.0–0.2)

## 2024-04-14 LAB — IRON AND TIBC
Iron: 174 ug/dL — ABNORMAL HIGH (ref 28–170)
Saturation Ratios: 55 % — ABNORMAL HIGH (ref 10.4–31.8)
TIBC: 316 ug/dL (ref 250–450)
UIBC: 142 ug/dL

## 2024-04-14 LAB — RETIC PANEL
Immature Retic Fract: 15 % (ref 2.3–15.9)
RBC.: 4.41 MIL/uL (ref 3.87–5.11)
Retic Count, Absolute: 24.3 K/uL (ref 19.0–186.0)
Retic Ct Pct: 0.6 % (ref 0.4–3.1)
Reticulocyte Hemoglobin: 22.6 pg — ABNORMAL LOW (ref >27.9)

## 2024-04-14 LAB — SAMPLE TO BLOOD BANK

## 2024-04-16 ENCOUNTER — Other Ambulatory Visit: Payer: Self-pay

## 2024-04-16 DIAGNOSIS — D509 Iron deficiency anemia, unspecified: Secondary | ICD-10-CM

## 2024-04-19 LAB — PROTHROMBIN GENE MUTATION

## 2024-04-19 LAB — FACTOR 5 LEIDEN

## 2024-04-26 ENCOUNTER — Ambulatory Visit (INDEPENDENT_AMBULATORY_CARE_PROVIDER_SITE_OTHER): Admitting: Clinical

## 2024-04-26 DIAGNOSIS — F411 Generalized anxiety disorder: Secondary | ICD-10-CM

## 2024-04-26 DIAGNOSIS — F4321 Adjustment disorder with depressed mood: Secondary | ICD-10-CM | POA: Diagnosis not present

## 2024-04-26 NOTE — Progress Notes (Signed)
   Darice Seats, LCSW

## 2024-04-26 NOTE — Progress Notes (Signed)
 [facility] Counselor/Therapist Progress Note  Patient ID: Diane [name], MRN: [id number],    Date: 04/26/2024  Time Spent: 8:34am - 9:20am : 46 minutes  Treatment Type: Individual Therapy  Reported Symptoms: anxiety, worry  Mental Status Exam: Appearance:  Neat and Well Groomed     Behavior: Appropriate  Motor: Normal  Speech/Language:  Clear and Coherent and Normal Rate  Affect: Appropriate  Mood: normal  Thought process: normal  Thought content:   WNL  Sensory/Perceptual disturbances:   WNL  Orientation: oriented to person, place, time/date, and situation  Attention: Good  Concentration: Good  Memory: WNL  Fund of knowledge:  Good  Insight:   Good  Judgment:  Good  Impulse Control: Good   Risk Assessment: Danger to Self:  No Patient denied current suicidal ideation  Self-injurious Behavior: No Danger to Others: No Patient denied current homicidal ideation Duty to Warn:no Physical Aggression / Violence:No  Access to Firearms a concern: No  Gang Involvement:No   Subjective: Patient stated, things have been ok in response to events since last session. Patient stated, still trying to sort out some health stuff and patient reported patient has an upcoming test to determine plan of care. Patient reported reading test results online and reported results kind of sent me into a sort of a spiral. Patient reported practicing grounding exercises and stated, which helped a lot. Patient reported using grounding exercises of counting during recent CT scan and stated, it was helpful. Patient stated, its been pretty good, I've been feeling good in response to mood since last session. Patient reported patient recently attended an appointment with a psychiatrist and psychiatrist prescribed Zoloft. Patient reported patient spoke with friends about their experiences as it relates to anxiety and medications. Patient stated, talking to them and hearing their  experience was the most helpful. 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 Patient reported driving by children's school in response to thoughts. Patient reported worry, am I dismissing a gut feeling. Patient stated, I feel like that compulsion to go and look in reference to checking on children at school. Patient reported experiencing catastrophizing thoughts when husband does not return from an errand in a specific time frame. Patient stated, sometimes its just easier to call him in reference to challenging thoughts and generating alternatives when husband is delayed.   Interventions: Cognitive Behavioral Therapy. Clinician conducted session via caregility video from clinician's home office. Patient provided verbal consent to proceed with telehealth session and is aware of limitations of telephone or video visits. Patient participated in session from patient's home. Reviewed events since last session and assessed for changes. Discussed recent triggers for anxiety. Reviewed coping strategies patient practiced in response to feelings of anxiety. Provided psycho education related to mindfulness and mindfulness exercises, such as, mindful eating. Discussed recent appointment with psychiatrist and patient's response. Explored and identified catastrophizing thoughts and patient's behavioral responses to thoughts. Explored and identified barriers to challenging negative thought patterns and generating alternatives. Reviewed generating alternatives. Clinician requested for homework patient continue thought record, practice challenging negative thought patterns/cognitive distortions using socratic questioning, and practice grounding exercises.   Collaboration of Care: not required at this time   Diagnosis:  Generalized anxiety disorder   Adjustment disorder with depressed mood    Plan:  Patient is to utilize [employer] Therapy, thought re-framing, behavioral activation, mindfulness and coping strategies to decrease symptoms associated with their diagnosis. Frequency: bi-weekly  Modality:  individual      Long-term goal:   Reduce overall level, frequency, and intensity of the feelings of anxiety as evidenced by decreased worrying about worse case scenario situations, irritability, overstimulation by noises in the environment, my mind just starts spiraling, nausea when feeling anxious, shaky feeling, feeling on edge, lack of motivation when anxious, difficulty concentrating, fatigue and increased sleep when feeling anxious from 6 to 7 days/week to 0 to 1 days/week per patient report for at least 3 consecutive months. Target Date: 11/03/24  Progress: progressing    Short-term goal:  Develop and implement mindfulness strategies to increase patient being present when spending time with family and other activities Target Date: 11/23/24  Progress: progressing    Increase and maintain daily self care strategies, such as, drinking water, eating 3 healthy meals per day, healthy sleep habits, exercising, and work/life balance Target Date: 11/23/24  Progress: progressing    Develop and implement coping strategies and effective stress management strategies to decrease feelings of irritability and negative responses to stress Target Date: 11/23/24  Progress: progressing    Identify, challenge, and replace negative thought patterns that contribute to feelings of guilt and anxiety with positive thoughts and beliefs per patient's report Target Date: 11/23/24  Progress: progressing    Begin healthy grieving process Target Date: 11/23/24  Progress: progressing     Darice Seats, LCSW

## 2024-04-28 ENCOUNTER — Other Ambulatory Visit (HOSPITAL_COMMUNITY)
Admission: RE | Admit: 2024-04-28 | Discharge: 2024-04-28 | Disposition: A | Discharge status: Home / Self Care | Source: Ambulatory Visit | Attending: Obstetrics & Gynecology | Admitting: Obstetrics & Gynecology

## 2024-04-28 ENCOUNTER — Other Ambulatory Visit (HOSPITAL_BASED_OUTPATIENT_CLINIC_OR_DEPARTMENT_OTHER)

## 2024-04-28 ENCOUNTER — Encounter (HOSPITAL_BASED_OUTPATIENT_CLINIC_OR_DEPARTMENT_OTHER): Payer: Self-pay | Admitting: Obstetrics & Gynecology

## 2024-04-28 ENCOUNTER — Ambulatory Visit (HOSPITAL_BASED_OUTPATIENT_CLINIC_OR_DEPARTMENT_OTHER): Admitting: Obstetrics & Gynecology

## 2024-04-28 VITALS — BP 109/65 | HR 72 | Ht 65.0 in | Wt 200.0 lb

## 2024-04-28 DIAGNOSIS — D250 Submucous leiomyoma of uterus: Secondary | ICD-10-CM

## 2024-04-28 DIAGNOSIS — N920 Excessive and frequent menstruation with regular cycle: Secondary | ICD-10-CM | POA: Diagnosis present

## 2024-04-28 DIAGNOSIS — N858 Other specified noninflammatory disorders of uterus: Secondary | ICD-10-CM

## 2024-04-28 NOTE — Progress Notes (Signed)
 GYNECOLOGY  VISIT  CC:  here for sonohysterogram   HPI: 38 y.o. [id number] Married White or Caucasian female here for Sonohysterogram due to ultrasound showing uterus measuring 9 x 6 x 7cm showing 6.7cm and 2.0cm fibroid that appeared to be submucosal.  She has heavy bleeding with clots and has had significant anemia with hb down to around 7.0 in September.  Hemoglobin was 9.5.  She is taking oral iron  and on oral provera 20mg  daily to help with bleeding.    Sonohysterogram today showed a 6.6 x 4.4cm fibroid complex that does have some irregular appearance.  These are elevating the endometrium but are fully within the endometrium, most consistent with Type 3 fibroid(s).  Given the irregular appearance which may be due this being two and not one fibroid, endometrial biopsy recommended today.  We also discussed the possibility of MRI for better evaluation of this ultrasound finding.  Questions answered.  Pt ok with proceeding with biopsy today.  Consent obtained.    We did discuss treatment options for bleeding/fibroids. Pt wants to know if should continue progesterone.  On 20mg  daily.  Recommended continuing this for now.  Additional questions answered.   Patient's last menstrual period was 04/15/2024 (approximate).  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 02/2022 Zio: predominantly sinus rhythm @ 85 (53-162). 1 symptomatic run of SVT @ 162 x 16 beats. Rare PACs.   Stress  incontinence 04/16/2017   Thyroiditis    Thyroiditis, 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    MEDS:  Reviewed in EPIC  ALLERGIES: Patient has no known allergies.  SH:  married, non smoker  Review of Systems  Constitutional: Negative.   Genitourinary:        Menorrhagia    PHYSICAL EXAMINATION:    BP 109/65   Pulse 72   Ht 5' 5 (1.651 m) Comment: Reported  Wt 200 lb (90.7 kg)   LMP 04/15/2024 (Approximate)   BMI 33.28 kg/m     General appearance: alert, cooperative and appears stated age Lymph:  no inguinal LAD noted  Pelvic: External genitalia:  no lesions              Urethra:  normal appearing urethra with no masses, tenderness or lesions              Bartholins and Skenes: normal                 Vagina: normal mucosa without prolapse or lesions              Cervix: no lesions  Endometrial biopsy recommended.  Discussed with patient.  Verbal and written consent obtained.   Procedure:  Speculum placed.  Cervix visualized and cleansed with betadine prep.  A single toothed tenaculum was applied to the anterior lip of the cervix.  Endometrial pipelle was advanced through the cervix into the  endometrial cavity without difficulty.  Pipelle passed to 7.5cm.  Suction applied and pipelle removed with good tissue sample obtained.  Tenculum removed.  No bleeding noted.  Patient tolerated procedure well.  Chaperone was present for exam.  Assessment/Plan: 1. Menorrhagia with regular cycle (Primary) - Surgical pathology( [place]/ [facility]) - continue Provera 20mg  daily  2. Uterine mass - may consider MRI depending on endometrial biopsy results

## 2024-04-29 ENCOUNTER — Encounter (HOSPITAL_BASED_OUTPATIENT_CLINIC_OR_DEPARTMENT_OTHER): Payer: Self-pay | Admitting: Obstetrics & Gynecology

## 2024-04-29 ENCOUNTER — Other Ambulatory Visit (HOSPITAL_BASED_OUTPATIENT_CLINIC_OR_DEPARTMENT_OTHER): Payer: Self-pay | Admitting: Obstetrics & Gynecology

## 2024-04-29 DIAGNOSIS — D250 Submucous leiomyoma of uterus: Secondary | ICD-10-CM

## 2024-04-29 DIAGNOSIS — N920 Excessive and frequent menstruation with regular cycle: Secondary | ICD-10-CM

## 2024-05-03 LAB — SURGICAL PATHOLOGY

## 2024-05-08 ENCOUNTER — Ambulatory Visit (HOSPITAL_BASED_OUTPATIENT_CLINIC_OR_DEPARTMENT_OTHER): Payer: Self-pay | Admitting: Obstetrics & Gynecology

## 2024-05-10 ENCOUNTER — Ambulatory Visit
Admission: RE | Admit: 2024-05-10 | Discharge: 2024-05-10 | Disposition: A | Discharge status: Home / Self Care | Source: Ambulatory Visit | Attending: Obstetrics & Gynecology | Admitting: Obstetrics & Gynecology

## 2024-05-10 ENCOUNTER — Ambulatory Visit: Admitting: Clinical

## 2024-05-10 DIAGNOSIS — D250 Submucous leiomyoma of uterus: Secondary | ICD-10-CM | POA: Diagnosis present

## 2024-05-10 DIAGNOSIS — N920 Excessive and frequent menstruation with regular cycle: Secondary | ICD-10-CM | POA: Diagnosis present

## 2024-05-10 MED ORDER — GADOBUTROL 1 MMOL/ML IV SOLN
9.0000 mL | Freq: Once | INTRAVENOUS | Status: AC | PRN
  Administered 2024-05-10: 9 mL via INTRAVENOUS

## 2024-05-13 ENCOUNTER — Ambulatory Visit

## 2024-05-16 ENCOUNTER — Other Ambulatory Visit: Payer: Self-pay

## 2024-05-16 ENCOUNTER — Emergency Department
Admission: EM | Admit: 2024-05-16 | Discharge: 2024-05-16 | Disposition: A | Discharge status: Home / Self Care | Attending: Emergency Medicine | Admitting: Emergency Medicine

## 2024-05-16 DIAGNOSIS — E039 Hypothyroidism, unspecified: Secondary | ICD-10-CM | POA: Diagnosis not present

## 2024-05-16 DIAGNOSIS — J45909 Unspecified asthma, uncomplicated: Secondary | ICD-10-CM | POA: Insufficient documentation

## 2024-05-16 DIAGNOSIS — E860 Dehydration: Secondary | ICD-10-CM | POA: Diagnosis not present

## 2024-05-16 DIAGNOSIS — N390 Urinary tract infection, site not specified: Secondary | ICD-10-CM | POA: Insufficient documentation

## 2024-05-16 DIAGNOSIS — R1032 Left lower quadrant pain: Secondary | ICD-10-CM | POA: Diagnosis present

## 2024-05-16 LAB — COMPREHENSIVE METABOLIC PANEL WITH GFR
ALT: 14 U/L (ref 0–44)
AST: 19 U/L (ref 15–41)
Albumin: 4.4 g/dL (ref 3.5–5.0)
Alkaline Phosphatase: 70 U/L (ref 38–126)
Anion gap: 13 (ref 5–15)
BUN: 12 mg/dL (ref 6–20)
CO2: 20 mmol/L — ABNORMAL LOW (ref 22–32)
Calcium: 8.8 mg/dL — ABNORMAL LOW (ref 8.9–10.3)
Chloride: 106 mmol/L (ref 98–111)
Creatinine, Ser: 0.71 mg/dL (ref 0.44–1.00)
GFR, Estimated: >60 mL/min (ref >60)
Glucose, Bld: 139 mg/dL — ABNORMAL HIGH (ref 70–99)
Potassium: 3.4 mmol/L — ABNORMAL LOW (ref 3.5–5.1)
Sodium: 139 mmol/L (ref 135–145)
Total Bilirubin: <0.2 mg/dL (ref 0.0–1.2)
Total Protein: 7.2 g/dL (ref 6.5–8.1)

## 2024-05-16 LAB — URINALYSIS, ROUTINE W REFLEX MICROSCOPIC
Bilirubin Urine: NEGATIVE
Glucose, UA: NEGATIVE mg/dL
Ketones, ur: 20 mg/dL — AB
Leukocytes,Ua: NEGATIVE
Nitrite: NEGATIVE
Protein, ur: 100 mg/dL — AB
RBC / HPF: >50 RBC/hpf (ref 0–5)
Specific Gravity, Urine: 1.026 (ref 1.005–1.030)
pH: 5 (ref 5.0–8.0)

## 2024-05-16 LAB — CBC
HCT: 34.5 % — ABNORMAL LOW (ref 36.0–46.0)
Hemoglobin: 10.8 g/dL — ABNORMAL LOW (ref 12.0–15.0)
MCH: 24.1 pg — ABNORMAL LOW (ref 26.0–34.0)
MCHC: 31.3 g/dL (ref 30.0–36.0)
MCV: 77 fL — ABNORMAL LOW (ref 80.0–100.0)
Platelets: 308 K/uL (ref 150–400)
RBC: 4.48 MIL/uL (ref 3.87–5.11)
RDW: 21.2 % — ABNORMAL HIGH (ref 11.5–15.5)
WBC: 5.9 K/uL (ref 4.0–10.5)
nRBC: 0 % (ref 0.0–0.2)

## 2024-05-16 LAB — POC URINE PREG, ED: Preg Test, Ur: NEGATIVE

## 2024-05-16 LAB — LIPASE, BLOOD: Lipase: 24 U/L (ref 11–51)

## 2024-05-16 MED ORDER — CEFUROXIME AXETIL 250 MG PO TABS
250.0000 mg | ORAL_TABLET | Freq: Once | ORAL | Status: AC
  Administered 2024-05-16: 250 mg via ORAL
  Filled 2024-05-16: qty 1

## 2024-05-16 MED ORDER — ONDANSETRON 4 MG PO TBDP
8.0000 mg | ORAL_TABLET | Freq: Once | ORAL | Status: AC
  Administered 2024-05-16: 8 mg via ORAL
  Filled 2024-05-16: qty 2

## 2024-05-16 MED ORDER — CEFUROXIME AXETIL 250 MG PO TABS: 250.0000 mg | ORAL_TABLET | Freq: Two times a day (BID) | ORAL | 0 refills | Status: DC

## 2024-05-16 MED ORDER — ONDANSETRON 4 MG PO TBDP: 4.0000 mg | ORAL_TABLET | Freq: Three times a day (TID) | ORAL | 0 refills | Status: DC | PRN

## 2024-05-16 MED ORDER — IBUPROFEN 600 MG PO TABS
600.0000 mg | ORAL_TABLET | Freq: Once | ORAL | Status: AC
  Administered 2024-05-16: 600 mg via ORAL
  Filled 2024-05-16: qty 1

## 2024-05-16 NOTE — ED Notes (Signed)
 In to d/c pt at this time. Pt family at bedside requesting to speak with the MD before d/c. MD made aware.

## 2024-05-16 NOTE — ED Provider Notes (Signed)
 [facility] Provider Note   Event Date/Time   First MD Initiated Contact with Patient 05/16/24 [phone]     (approximate) History  Dizziness  HPI Diane [name] is a 38 y.o. female with a past medical history of endometriosis, gestational diabetes, hypothyroidism, asthma, and GERD who presents complaining of left lower quadrant abdominal pain with polyuria and lightheadedness that began overnight.  Patient states that when she attempted to go to the bathroom this morning she became lightheaded and blacked out.  Patient's husband at bedside and states that when she tried to go back to bed she had another episode of blacking out where the husband caught her and patient did not fall or hit her head.  Husband reports patient woke up as soon as the patient was laid flat on the floor.  Patient also endorses nausea with vomiting and occasional chills. ROS: Patient currently denies any vision changes, tinnitus, difficulty speaking, facial droop, sore throat, chest pain, shortness of breath, nausea/vomiting/diarrhea, dysuria, or weakness/numbness/paresthesias in any extremity   Physical Exam  Triage Vital Signs: ED Triage Vitals [05/16/24 0708]  Encounter Vitals Group     BP 111/73     Girls Systolic BP Percentile      Girls Diastolic BP Percentile      Boys Systolic BP Percentile      Boys Diastolic BP Percentile      Pulse Rate 61     Resp 18     Temp 98 F (36.7 C)     Temp src      SpO2 100 %     Weight 195 lb (88.5 kg)     Height 5' 5 (1.651 m)     Head Circumference      Peak Flow      Pain Score 5     Pain Loc      Pain Education      Exclude from Growth Chart    Most recent vital signs: Vitals:   05/16/24 0708  BP: 111/73  Pulse: 61  Resp: 18  Temp: 98 F (36.7 C)  SpO2: 100%   General: Awake, oriented x4. CV:  Good peripheral perfusion. Resp:  Normal effort. Abd:  No distention.  Mild left lower quadrant tenderness to  palpation Other:  Middle-aged overweight Caucasian female resting comfortably in no acute distress ED Results / Procedures / Treatments  Labs (all labs ordered are listed, but only abnormal results are displayed) Labs Reviewed  COMPREHENSIVE METABOLIC PANEL WITH GFR - Abnormal; Notable for the following components:      Result Value   Potassium 3.4 (*)    CO2 20 (*)    Glucose, Bld 139 (*)    Calcium 8.8 (*)    All other components within normal limits  CBC - Abnormal; Notable for the following components:   Hemoglobin 10.8 (*)    HCT 34.5 (*)    MCV 77.0 (*)    MCH 24.1 (*)    RDW 21.2 (*)    All other components within normal limits  URINALYSIS, ROUTINE W REFLEX MICROSCOPIC - Abnormal; Notable for the following components:   Color, Urine AMBER (*)    APPearance CLOUDY (*)    Hgb urine dipstick LARGE (*)    Ketones, ur 20 (*)    Protein, ur 100 (*)    Bacteria, UA RARE (*)    All other components within normal limits  LIPASE, BLOOD  POC URINE PREG, ED   EKG ED  ECG REPORT I, Artist MARLA Kerns, the attending physician, personally viewed and interpreted this ECG. Date: 05/16/2024 EKG Time: 0713 Rate: 61 Rhythm: normal sinus rhythm QRS Axis: normal Intervals: normal ST/T Wave abnormalities: normal Narrative Interpretation: no evidence of acute ischemia PROCEDURES: Critical Care performed: No Procedures MEDICATIONS ORDERED IN ED: Medications  ondansetron  (ZOFRAN -ODT) disintegrating tablet 8 mg (8 mg Oral Given 05/16/24 0919)  cefUROXime  (CEFTIN ) tablet 250 mg (250 mg Oral Given 05/16/24 0917)  ibuprofen  (ADVIL ) tablet 600 mg (600 mg Oral Given 05/16/24 9077)   IMPRESSION / MDM / ASSESSMENT AND PLAN / ED COURSE  I reviewed the triage vital signs and the nursing notes.                             The patient is on the cardiac monitor to evaluate for evidence of arrhythmia and/or significant heart rate changes. Patient's presentation is most consistent with acute  presentation with potential threat to life or bodily function. Patient is a 38 year old female with the above-stated past medical history presents for left lower quadrant pain, lightheadedness with syncope/presyncope, nausea, and polyuria over the last 24 hours DDx: UTI, sepsis, gastroenteritis, diverticulitis Plan: CBC, CMP, UA, lipase, urine pregnancy, EKG  Laboratory evaluation significant for rare bacteria and large hemoglobin in the urine, stable anemia with hemoglobin of 10.8.  Patient was informed of these results and her left lower quadrant abdominal pain.  Patient agrees with plan for empiric treatment of UTI symptoms with bacteria on UA.  Patient also given prescription for Zofran  for any persistent nausea/vomiting.  Also discussed with patient the possibility that this may be early GI infection and given strict return precautions.  Patient agrees with plan and all questions answered prior to discharge  Dispo: Discharge home with PCP follow-up   FINAL CLINICAL IMPRESSION(S) / ED DIAGNOSES   Final diagnoses:  Dehydration  Urinary tract infection with hematuria, site unspecified  LLQ abdominal pain   Rx / DC Orders   ED Discharge Orders          Ordered    ondansetron  (ZOFRAN -ODT) 4 MG disintegrating tablet  Every 8 hours PRN        05/16/24 1042    cefUROXime  (CEFTIN ) 250 MG tablet  2 times daily with meals        05/16/24 1042           Note:  This document was prepared using Dragon voice recognition software and may include unintentional dictation errors.   Jayma Volpi K, MD 05/16/24 1049

## 2024-05-16 NOTE — ED Notes (Signed)
 Pt stating that she has been unable to provide urine sample. Pt states she tried x2 in the lobby but was unable. Pt ambulatory to the bathroom at this time to attempt urine sample.

## 2024-05-16 NOTE — ED Notes (Signed)
 Per pt she was able to keep down crackers and water without any issues.

## 2024-05-16 NOTE — ED Triage Notes (Signed)
 Pt comes with c/o possible UTI, dizziness and passed out today. Pt states symptoms with more urine frequency yesterday. Pt states then this morning she started to have some left sided pain. Pt went to bathroom and got lightheaded and blacking out. Pt tried to go back to bed and passed out . Pt husband caught her and she did not fall or hit her head. Husband reports brief episode.  Pt states nausea and chills. Pt denies any headache, cp or sob.

## 2024-05-16 NOTE — ED Notes (Signed)
 Urine collected at this time. Per pt she is on her menstrual cycle. Urine noted to be bloody.

## 2024-05-16 NOTE — ED Notes (Addendum)
 Pt given crackers and water.

## 2024-05-17 ENCOUNTER — Encounter: Payer: Self-pay | Admitting: Obstetrics & Gynecology

## 2024-05-17 ENCOUNTER — Encounter (HOSPITAL_BASED_OUTPATIENT_CLINIC_OR_DEPARTMENT_OTHER): Payer: Self-pay | Admitting: Obstetrics & Gynecology

## 2024-05-17 ENCOUNTER — Ambulatory Visit (HOSPITAL_BASED_OUTPATIENT_CLINIC_OR_DEPARTMENT_OTHER): Payer: Self-pay | Admitting: Obstetrics & Gynecology

## 2024-05-19 ENCOUNTER — Other Ambulatory Visit: Payer: Self-pay | Admitting: Primary Care

## 2024-05-19 DIAGNOSIS — E1165 Type 2 diabetes mellitus with hyperglycemia: Secondary | ICD-10-CM

## 2024-05-24 ENCOUNTER — Ambulatory Visit: Admitting: Clinical

## 2024-05-24 DIAGNOSIS — F411 Generalized anxiety disorder: Secondary | ICD-10-CM

## 2024-05-24 DIAGNOSIS — F4321 Adjustment disorder with depressed mood: Secondary | ICD-10-CM | POA: Diagnosis not present

## 2024-05-24 NOTE — Progress Notes (Signed)
 [facility] Counselor/Therapist Progress Note  Patient ID: Diane [name], MRN: [id number],    Date: 05/24/2024  Time Spent: 9:32am - 10:27am : 55 minutes   Treatment Type: Individual Therapy  Reported Symptoms: sadness  Mental Status Exam: Appearance:  Neat and Well Groomed     Behavior: Appropriate  Motor: Normal  Speech/Language:  Clear and Coherent and Normal Rate  Affect: Tearful  Mood: sad  Thought process: normal  Thought content:   WNL  Sensory/Perceptual disturbances:   WNL  Orientation: oriented to person, place, time/date, and situation  Attention: Good  Concentration: Good  Memory: WNL  Fund of knowledge:  Good  Insight:   Good  Judgment:  Good  Impulse Control: Good   Risk Assessment: Danger to Self:  No Patient denied current suicidal ideation  Self-injurious Behavior: No Danger to Others: No Patient denied current homicidal ideation Duty to Warn:no Physical Aggression / Violence:No  Access to Firearms a concern: No  Gang Involvement:No   Subjective: Patient reported patient canceled patient's last therapy appointment due to 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 Patient stated, still continuing to navigate all the health stuff in response to events since last session. Patient stated, everything just feels like a hurry up and wait in reference to patient's health concerns. Patient stated, I'm still hopeful I can get something revolved before the end of the year in reference to patient's health. Patient reported patient will require surgery and has upcoming appointments to obtain information regarding patient's surgical options. Patient reported patient is considering a hysterectomy.  Patient stated, Thanksgiving was rough this year and reported due to patient's health and vehicle related stressors. Patient reported feeling patient's mother is not aware of the impact of mother's grief on others. Patient stated, her (mother) grief is  very all encompassing. Patient reported feeling patient lost her mother when patient's father died. Patient stated, it feels like she's (mother) choosing her grief over everyone around her.   Interventions: Cognitive Behavioral Therapy. Clinician conducted session via caregility video from clinician's home office. Patient provided verbal consent to proceed with telehealth session and is aware of limitations of telephone or video visits. Patient participated in session from patient's home. Reviewed events since last session and assessed for changes. Discussed recent canceled appointment. Discussed status of patient's health and patient's thoughts and feelings regarding patient's health. Discussed strategies to decrease anxiety, such as, documenting list of questions for providers and taking notes during appointments. Explored feelings of grief and loss related to patient's mother's experience and the loss of patient's father. Provided psycho education related to grief. Clinician requested for homework patient continue thought record, practice challenging negative thought patterns/cognitive distortions using socratic questioning, and practice grounding exercises.   Collaboration of Care: not required at this time   Diagnosis:  Generalized anxiety disorder   Adjustment disorder with depressed mood    Plan: Patient is to utilize [employer] Therapy, thought re-framing, behavioral activation, mindfulness and coping strategies to decrease symptoms associated with their diagnosis. Frequency: bi-weekly  Modality: individual      Long-term goal:   Reduce overall level, frequency, and intensity of the feelings of anxiety as evidenced by decreased worrying about worse case scenario situations, irritability, overstimulation by noises in the environment, my mind just starts spiraling, nausea when feeling anxious, shaky feeling, feeling on edge, lack of motivation when anxious, difficulty  concentrating, fatigue and increased sleep when feeling anxious from 6 to 7 days/week to 0 to 1 days/week per patient report for  at least 3 consecutive months. Target Date: 11/03/24  Progress: progressing    Short-term goal:  Develop and implement mindfulness strategies to increase patient being present when spending time with family and other activities Target Date: 11/23/24  Progress: progressing    Increase and maintain daily self care strategies, such as, drinking water, eating 3 healthy meals per day, healthy sleep habits, exercising, and work/life balance Target Date: 11/23/24  Progress: progressing    Develop and implement coping strategies and effective stress management strategies to decrease feelings of irritability and negative responses to stress Target Date: 11/23/24  Progress: progressing    Identify, challenge, and replace negative thought patterns that contribute to feelings of guilt and anxiety with positive thoughts and beliefs per patient's report Target Date: 11/23/24  Progress: progressing    Begin healthy grieving process Target Date: 11/23/24  Progress: progressing       Darice Seats, LCSW

## 2024-05-24 NOTE — Progress Notes (Signed)
   Darice Seats, LCSW

## 2024-05-28 ENCOUNTER — Other Ambulatory Visit (HOSPITAL_BASED_OUTPATIENT_CLINIC_OR_DEPARTMENT_OTHER): Payer: Self-pay | Admitting: Obstetrics & Gynecology

## 2024-05-31 ENCOUNTER — Encounter (HOSPITAL_BASED_OUTPATIENT_CLINIC_OR_DEPARTMENT_OTHER): Payer: Self-pay

## 2024-06-01 ENCOUNTER — Ambulatory Visit (HOSPITAL_BASED_OUTPATIENT_CLINIC_OR_DEPARTMENT_OTHER): Admitting: Obstetrics & Gynecology

## 2024-06-01 ENCOUNTER — Encounter (HOSPITAL_BASED_OUTPATIENT_CLINIC_OR_DEPARTMENT_OTHER): Payer: Self-pay | Admitting: Obstetrics & Gynecology

## 2024-06-01 VITALS — BP 122/79 | HR 74 | Wt 194.4 lb

## 2024-06-01 DIAGNOSIS — N393 Stress incontinence (female) (male): Secondary | ICD-10-CM

## 2024-06-01 DIAGNOSIS — N921 Excessive and frequent menstruation with irregular cycle: Secondary | ICD-10-CM | POA: Diagnosis not present

## 2024-06-01 DIAGNOSIS — D509 Iron deficiency anemia, unspecified: Secondary | ICD-10-CM

## 2024-06-01 DIAGNOSIS — D251 Intramural leiomyoma of uterus: Secondary | ICD-10-CM | POA: Diagnosis not present

## 2024-06-01 MED ORDER — NORETHINDRONE ACETATE 5 MG PO TABS: 5.0000 mg | ORAL_TABLET | Freq: Two times a day (BID) | ORAL | 1 refills | Status: DC

## 2024-06-01 NOTE — Progress Notes (Unsigned)
   GYNECOLOGY  VISIT  CC:   Consult (Surgery consult)   HPI:   Last pap:  07/05/2021 neg with negative HR HPV>    No LMP recorded (lmp unknown).  Past Medical History:  Diagnosis Date  . Anemia   . Anxiety   . Asthma    activity induced asthma  . BRCA negative 10/2022   MyRisk neg except AXIN2 VUS/FH carrier  . Dyspnea   . Endometriosis   . Family history of breast cancer 10/2022   riskscore=17.8%/IBIS=18.8%  . Fibroid   . Gall stones 07/30/2017  . GERD (gastroesophageal reflux disease)   . Gestational diabetes   . Gestational diabetes mellitus   . Hypothyroidism   . Mass of right hand 02/20/2018  . 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 Ovarian cyst   . Palpitations    a. 02/2022 Zio: predominantly sinus rhythm @ 85 (53-162). 1 symptomatic run of SVT @ 162 x 16 beats. Rare PACs.  . Stress incontinence 04/16/2017  . Thyroiditis   . Thyroiditis, 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    MEDS:  Reviewed in EPIC  ALLERGIES: Patient has no known allergies.  SH:  married, non smoker  Review of Systems  Constitutional: Negative.   Respiratory: Negative.    Cardiovascular: Negative.   Genitourinary:        Menorrhagia    PHYSICAL EXAMINATION:    BP 122/79 (BP Location: Left Arm, Patient Position: Sitting, Cuff Size: Normal)   Pulse 74   Wt 194 lb 6.4 oz (88.2 kg)   LMP  (LMP Unknown)   SpO2 100%   BMI 32.35 kg/m     General appearance: alert, cooperative and appears stated age CV:  Regular rate and rhythm Lungs:  clear to auscultation, no wheezes, rales or rhonchi, symmetric air entry Abdomen: soft, non-tender; bowel sounds normal; no masses,  no organomegaly Lymph:  no inguinal LAD noted  Pelvic: External genitalia:  no lesions              Urethra:  normal appearing urethra with no masses, tenderness or lesions              Bartholins and Skenes: normal                 Vagina: normal mucosa without prolapse or lesions              Cervix:  no lesions and blood at os and in vagina              Bimanual Exam:  Uterus:  {CHL AMB PHY EX UTERUS NORM DEFAULT:[phone]::normal size, contour, position, consistency, mobility, non-tender}              Adnexa: no mass, fullness, tenderness  Chaperone was present for exam.  Assessment/Plan: There are no diagnoses linked to this encounter.

## 2024-06-02 ENCOUNTER — Other Ambulatory Visit (HOSPITAL_BASED_OUTPATIENT_CLINIC_OR_DEPARTMENT_OTHER): Payer: Self-pay | Admitting: Obstetrics & Gynecology

## 2024-06-02 ENCOUNTER — Inpatient Hospital Stay: Attending: Oncology

## 2024-06-02 ENCOUNTER — Encounter (HOSPITAL_BASED_OUTPATIENT_CLINIC_OR_DEPARTMENT_OTHER): Payer: Self-pay

## 2024-06-02 DIAGNOSIS — N921 Excessive and frequent menstruation with irregular cycle: Secondary | ICD-10-CM

## 2024-06-02 DIAGNOSIS — Z800 Family history of malignant neoplasm of digestive organs: Secondary | ICD-10-CM | POA: Diagnosis not present

## 2024-06-02 DIAGNOSIS — Z01818 Encounter for other preprocedural examination: Secondary | ICD-10-CM

## 2024-06-02 DIAGNOSIS — Z8249 Family history of ischemic heart disease and other diseases of the circulatory system: Secondary | ICD-10-CM | POA: Insufficient documentation

## 2024-06-02 DIAGNOSIS — Z803 Family history of malignant neoplasm of breast: Secondary | ICD-10-CM | POA: Insufficient documentation

## 2024-06-02 DIAGNOSIS — D509 Iron deficiency anemia, unspecified: Secondary | ICD-10-CM | POA: Diagnosis present

## 2024-06-02 DIAGNOSIS — N920 Excessive and frequent menstruation with regular cycle: Secondary | ICD-10-CM | POA: Diagnosis not present

## 2024-06-02 DIAGNOSIS — R7989 Other specified abnormal findings of blood chemistry: Secondary | ICD-10-CM | POA: Insufficient documentation

## 2024-06-02 DIAGNOSIS — D250 Submucous leiomyoma of uterus: Secondary | ICD-10-CM

## 2024-06-02 LAB — CBC WITH DIFFERENTIAL (CANCER CENTER ONLY)
Abs Immature Granulocytes: 0.02 K/uL (ref 0.00–0.07)
Basophils Absolute: 0.1 K/uL (ref 0.0–0.1)
Basophils Relative: 1 %
Eosinophils Absolute: 0.2 K/uL (ref 0.0–0.5)
Eosinophils Relative: 3 %
HCT: 31.7 % — ABNORMAL LOW (ref 36.0–46.0)
Hemoglobin: 9.9 g/dL — ABNORMAL LOW (ref 12.0–15.0)
Immature Granulocytes: 0 %
Lymphocytes Relative: 17 %
Lymphs Abs: 1.1 K/uL (ref 0.7–4.0)
MCH: 24.6 pg — ABNORMAL LOW (ref 26.0–34.0)
MCHC: 31.2 g/dL (ref 30.0–36.0)
MCV: 78.9 fL — ABNORMAL LOW (ref 80.0–100.0)
Monocytes Absolute: 0.7 K/uL (ref 0.1–1.0)
Monocytes Relative: 11 %
Neutro Abs: 4.4 K/uL (ref 1.7–7.7)
Neutrophils Relative %: 68 %
Platelet Count: 297 K/uL (ref 150–400)
RBC: 4.02 MIL/uL (ref 3.87–5.11)
RDW: 18.6 % — ABNORMAL HIGH (ref 11.5–15.5)
WBC Count: 6.5 K/uL (ref 4.0–10.5)
nRBC: 0 % (ref 0.0–0.2)

## 2024-06-02 LAB — RETIC PANEL
Immature Retic Fract: 13.7 % (ref 2.3–15.9)
RBC.: 4.02 MIL/uL (ref 3.87–5.11)
Retic Count, Absolute: 49.4 K/uL (ref 19.0–186.0)
Retic Ct Pct: 1.2 % (ref 0.4–3.1)
Reticulocyte Hemoglobin: 29 pg (ref >27.9)

## 2024-06-02 LAB — FERRITIN: Ferritin: 52 ng/mL (ref 11–307)

## 2024-06-02 LAB — IRON AND TIBC
Iron: 29 ug/dL (ref 28–170)
Saturation Ratios: 12 % (ref 10.4–31.8)
TIBC: 249 ug/dL — ABNORMAL LOW (ref 250–450)
UIBC: 221 ug/dL

## 2024-06-02 LAB — SAMPLE TO BLOOD BANK

## 2024-06-03 ENCOUNTER — Ambulatory Visit: Admitting: Obstetrics & Gynecology

## 2024-06-03 VITALS — BP 113/71 | HR 79 | Wt 193.0 lb

## 2024-06-03 DIAGNOSIS — N921 Excessive and frequent menstruation with irregular cycle: Secondary | ICD-10-CM

## 2024-06-03 DIAGNOSIS — D251 Intramural leiomyoma of uterus: Secondary | ICD-10-CM

## 2024-06-03 NOTE — Progress Notes (Signed)
 Patient ID: Diane [name], female   DOB: 1986-05-28, 38 y.o.   MRN: [id number]  CC: heavy menses, scheduled for surgery   HPI Diane [name] is a 38 y.o. female.  [id number] No LMP recorded (lmp unknown). Patient has fibroid uterus and heavy irregular menses currently taking Provera . She was referred to Dr. CANDIE Pinal and found to have intramural fibroids and had SHG and MRI and recommended to have TLH BS, 06/22/24. 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 02/2022 Zio: predominantly sinus rhythm @ 85 (53-162). 1 symptomatic run of SVT @ 162 x 16 beats. Rare PACs.   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    Past Surgical History:  Procedure Laterality Date   CHOLECYSTECTOMY N/A 11/28/2017   Procedure: LAPAROSCOPIC CHOLECYSTECTOMY WITH INTRAOPERATIVE CHOLANGIOGRAM;  Surgeon: Gladis Cough, MD;  Location: [facility] ORS;  Service: General;  Laterality: N/A;   LAPAROSCOPY  2009    Family History  Problem Relation Age of Onset   Diabetes Father    Asthma Father    Hypertension Father    Other Brother    Cancer Maternal Grandmother    Heart disease Maternal Grandmother    Colon cancer Maternal Grandmother    Breast cancer Maternal Grandmother    Cancer Paternal Grandmother    Pancreatic cancer Paternal Grandmother    Heart disease Paternal Grandfather    Thyroid  disease Neg Hx      Social History Social History[1]  Allergies[2]  Current Outpatient Medications  Medication Sig Dispense Refill   Accu-Chek Softclix Lancets lancets 1 each by Other route 4 (four) times daily. 100 each 12   albuterol  (PROAIR  HFA) 108 (90 Base) MCG/ACT inhaler Inhale 2 puffs into the lungs every 6 (six) hours as needed for wheezing or shortness of breath. 1 each 0   ferrous sulfate 325 (65 FE) MG EC tablet Take 325 mg by mouth 3 (three) times daily with meals.     glucose blood (ACCU-CHEK SMARTVIEW) test strip Use as instructed to check blood sugars 100 each 12   norethindrone  (AYGESTIN ) 5 MG tablet Take 1 tablet (5 mg total) by mouth in the morning and at bedtime. 60 tablet 1   ondansetron  (ZOFRAN -ODT) 4 MG disintegrating tablet Take 1 tablet (4 mg total) by mouth every 8 (eight) hours as needed for nausea or vomiting. (Patient not taking: Reported on 06/01/2024) 20 tablet 0   sertraline (ZOLOFT) 50 MG tablet Take 50 mg by mouth every morning.     No current facility-administered medications for this visit.    Review of Systems Review of Systems  Constitutional: Negative.   Respiratory: Negative.    Cardiovascular: Negative.   Genitourinary:  Positive for menstrual problem. Negative for vaginal bleeding.    Blood pressure 113/71,  pulse 79, weight 193 lb (87.5 kg).  Physical Exam Physical Exam Vitals and nursing note reviewed.  Constitutional:      Appearance: Normal appearance.  HENT:     Head: Normocephalic and atraumatic.  Cardiovascular:     Rate and Rhythm: Normal rate.  Pulmonary:     Effort: Pulmonary effort is normal.  Neurological:     General: No focal deficit present.     Mental Status: She is alert.  Psychiatric:        Mood and Affect: Mood normal.        Behavior: Behavior normal.     Data Reviewed Sonohysterogram:   Written consent obtained.  Procedure reviewed. Questions answered.   Speculum placed.  Cervix visualized and cleansed with betadine  x 3.    A tenaculum  was applied to the cervix.  Dilation of the cervix was not necessary. The catheter was passed into the uterus and sterile saline introduced.  After procedure was completed, catheter and tenaculum removed.  Minimal bleeding noted.  Speculum removed.  Pt tolerated procedure well.   Findings noted: 4.4 x 6.6cm fibroid complex with what appears to be at least two fibroid appearing lesions that are elevated the endometrium but are not submucosal and are completely within the myometrium.  Findings c/w Type 3 fibroid(s)     Narrative & Impression  CLINICAL DATA:  Further evaluation of leiomyomata on sonohysterogram   EXAM: MRI PELVIS WITHOUT AND WITH CONTRAST   TECHNIQUE: Multiplanar multisequence MR imaging of the pelvis was performed both before and after administration of intravenous contrast.   CONTRAST:  9mL GADAVIST  GADOBUTROL  1 MMOL/ML IV SOLN   COMPARISON:  Sonohysterogram dated 04/28/2024   FINDINGS: Urinary Tract:  No abnormality visualized.   Bowel:  Unremarkable visualized pelvic bowel loops.   Vascular/Lymphatic: No pathologically enlarged lymph nodes. No significant vascular abnormality seen.   Reproductive: Uterus measures 10.1 cm in sagittal dimension. The endometrium is effaced and not well seen secondary to the large leiomyoma. Normal bilateral ovaries.   Heterogeneously T2 hypointense submucosal mass corresponding to the finding on prior sonohysterogram measures 6.1 x 5.6 x 5.5 cm (3:19, 6:15). This mass appears to consist of 3 distinct components with different signal characteristics, for example the component along the left fundus measuring 3.2 x 1.9 cm (3:19) demonstrates heterogeneous hypoenhancement (16:42), while the dominant posteroinferior component measuring 5.0 x 3.8 cm (3:17) contains an area of non enhancement along the right lateral aspect (16:42). The remaining, most T2 hypointense component along the right fundus measures 1.8 x  1.5 cm (3:17) and demonstrates diffuse homogeneous enhancement.   Other:  None.   Musculoskeletal: No suspicious bone lesions identified.   IMPRESSION: 1. Large submucosal mass corresponding to the finding on prior sonohysterogram measures 6.1 x 5.6 x 5.5 cm and appears to consist of 3 distinct components with different signal characteristics, as described above, likely reflecting leiomyomata with varying degrees of degeneration. 2. Normal bilateral ovaries.     Electronically Signed   By: Limin  Xu M.D.   On: 05/14/2024 16:36      Assessment Intramural uterine fibroid  Menorrhagia with irregular cycle   Plan We discussed likelihood of resolution of her sx with hysterectomy vs procedures to spare the uterus and that she has no plans for childbearing. Hysterectomy most likely to have a satisfactory result long term. We agreed that having the surgery is a safe and reasonable way to manage her sx and she plans to proceed. All questions were  answered   30 min    Lynwood Solomons 06/03/2024, 11:17 AM        [1]  Social History Tobacco Use   Smoking status: Never   Smokeless tobacco: Never  Vaping Use   Vaping status: Never Used  Substance Use Topics   Alcohol use: Yes    Comment: rarely   Drug use: No  [2] No Known Allergies

## 2024-06-07 ENCOUNTER — Ambulatory Visit: Admitting: Clinical

## 2024-06-07 ENCOUNTER — Encounter (HOSPITAL_BASED_OUTPATIENT_CLINIC_OR_DEPARTMENT_OTHER): Payer: Self-pay | Admitting: Obstetrics & Gynecology

## 2024-06-07 ENCOUNTER — Inpatient Hospital Stay: Admitting: Oncology

## 2024-06-07 ENCOUNTER — Encounter: Payer: Self-pay | Admitting: Oncology

## 2024-06-07 ENCOUNTER — Inpatient Hospital Stay

## 2024-06-07 VITALS — BP 115/82

## 2024-06-07 VITALS — BP 107/74 | HR 88 | Temp 98.6°F | Resp 20 | Wt 193.6 lb

## 2024-06-07 DIAGNOSIS — F411 Generalized anxiety disorder: Secondary | ICD-10-CM

## 2024-06-07 DIAGNOSIS — Z8249 Family history of ischemic heart disease and other diseases of the circulatory system: Secondary | ICD-10-CM | POA: Diagnosis not present

## 2024-06-07 DIAGNOSIS — F4321 Adjustment disorder with depressed mood: Secondary | ICD-10-CM

## 2024-06-07 DIAGNOSIS — D509 Iron deficiency anemia, unspecified: Secondary | ICD-10-CM

## 2024-06-07 MED ORDER — IRON SUCROSE 20 MG/ML IV SOLN
200.0000 mg | Freq: Once | INTRAVENOUS | Status: AC
  Administered 2024-06-07: 11:00:00 200 mg via INTRAVENOUS
  Filled 2024-06-07: qty 10

## 2024-06-07 NOTE — Progress Notes (Signed)
 Hematology/Oncology Consult note [phone] [phone]        REFERRING PROVIDER: Gretta Comer POUR, NP   CHIEF COMPLAINTS/REASON FOR VISIT:  Evaluation of iron  deficiency anemia.  Positive D-dimer.   ASSESSMENT & PLAN:   Iron  deficiency anemia Lab Results  Component Value Date   HGB 9.9 (L) 06/02/2024   TIBC 249 (L) 06/02/2024   IRONPCTSAT 12 06/02/2024   FERRITIN 52 06/02/2024    Hemoglobin has improved but not normalized yet. Improved iron  panel  Given her ongoing blood loss, recommend Venofer  x 1 dose to further improve iron  store.    Family history of thrombosis Negative  factor V Leiden mutation,negative prothrombin gene mutation   Orders Placed This Encounter  Procedures   CBC with Differential (Cancer Center Only)    Standing Status:   Future    Expected Date:   09/05/2024    Expiration Date:   12/04/2024   Iron  and TIBC    Standing Status:   Future    Expected Date:   09/05/2024    Expiration Date:   12/04/2024   Ferritin    Standing Status:   Future    Expected Date:   09/05/2024    Expiration Date:   12/04/2024   Retic Panel    Standing Status:   Future    Expected Date:   09/05/2024    Expiration Date:   12/04/2024   Follow-up in 3 months. All questions were answered. The patient knows to call the clinic with any problems, questions or concerns.  Zelphia Cap, MD, PhD [facility] Hematology Oncology 06/07/2024   HISTORY OF PRESENTING ILLNESS:   Diane [name] is a  38 y.o.  female with PMH listed below was seen in consultation at the request of  Gretta Comer POUR, NP  for evaluation of iron  deficient anemia, positive D-dimer.  Discussed the use of AI scribe software for clinical note transcription with the patient, who gave verbal consent to proceed.   Recent blood work on 03/02/2024 showed hemoglobin 7, ferritin 2.  Patient reports significant fatigue and lightheadedness. A year ago, her hemoglobin was 11.4.  She experiences  increasingly heavy menstrual periods, requiring a Super plus or ultra tampon every 90 minutes. This heavy bleeding contributes to her fatigue and lightheadedness. She recently felt faint and weak while at the grocery store with her children, necessitating that she sit down.  She has been evaluated by a gynecologist, and an ultrasound revealed fibroids, which may be contributing to her heavy menstrual bleeding. She was on a birth control pill and was recently switched to a progesterone-only pill.  Patient has shortness of breath.  Patient had elevated D-dimer test, which was conducted to rule out blood clots.  03/03/2024 CT chest angiogram pulmonary embolism protocol showed  1. Limited exam due to issue with timing of contrast bolus. There is no definite evidence of large central pulmonary embolus seen in the main pulmonary artery or the most proximal portions of the left and right pulmonary arteries. Unfortunately, due to timing of the contrast bolus and very limited opacification of the more peripheral branches of the pulmonary arteries, more peripheral pulmonary emboli cannot be excluded on the basis of this exam. 2. 2 mm nodule is noted in right lower lobe. If patient is low risk for malignancy, no routine follow-up imaging is recommended. If patient is high risk for malignancy, a non-contrast chest CT at 12 months is optional.    She has a family history of  pulmonary embolism, as her father passed away from one unexpectedly.    INTERVAL HISTORY Diane [name] is a 38 y.o. female who has above history reviewed by me today presents for follow up visit for IDA.  She has tolerated IV venofer .  There is plan of hysterotomy at end of December  Fatigue has improved.   Had an episode of UTI. Symptoms resolved after treatment.     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 02/2022 Zio: predominantly sinus rhythm @ 85 (53-162). 1 symptomatic run of SVT @ 162 x 16 beats. Rare PACs.   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    SURGICAL HISTORY: Past Surgical History:  Procedure Laterality Date   CHOLECYSTECTOMY N/A 11/28/2017   Procedure: LAPAROSCOPIC CHOLECYSTECTOMY WITH INTRAOPERATIVE CHOLANGIOGRAM;  Surgeon: Gladis Cough, MD;  Location: [facility] ORS;  Service: General;  Laterality: N/A;   LAPAROSCOPY  2009    SOCIAL HISTORY: Social History   Socioeconomic History   Marital status: Married    Spouse name: Not on file   Number of children: Not on file   Years of education: Not on file   Highest education level: Master's degree (e.g., MA, MS, MEng, MEd, MSW, MBA)  Occupational History   Not on file  Tobacco Use   Smoking status: Never   Smokeless tobacco: Never  Vaping Use   Vaping status: Never Used  Substance and Sexual Activity   Alcohol use: Yes    Comment: rarely   Drug use: No   Sexual activity: Yes    Birth control/protection: Pill  Other Topics Concern   Not on file  Social History Narrative   Married.   2 children.   Masters in Programme Researcher, Broadcasting/film/video.   Enjoys reading, photography, crafting.    Social Drivers of Health   Tobacco Use: Low Risk (06/07/2024)   Patient History    Smoking Tobacco Use: Never    Smokeless Tobacco Use: Never    Passive Exposure: Not on file  Financial Resource Strain: Low Risk (06/06/2024)   Overall Financial Resource Strain (CARDIA)    Difficulty of  Paying Living Expenses: Not very hard  Food Insecurity: No Food Insecurity (06/06/2024)   Epic    Worried About Programme Researcher, Broadcasting/film/video in the Last Year: Never true    Ran Out of Food in the Last Year: Never true  Transportation Needs: No Transportation Needs (06/06/2024)   Epic    Lack of Transportation (Medical): No    Lack of Transportation (Non-Medical): No  Physical Activity: Inactive (06/06/2024)   Exercise Vital Sign    Days of Exercise per Week: 0 days    Minutes of Exercise per Session: Not on file  Stress: No Stress Concern Present (06/06/2024)   [employer] of Occupational Health - Occupational Stress Questionnaire  Feeling of Stress: Only a little  Social Connections: Socially Integrated (06/06/2024)   Social Connection and Isolation Panel    Frequency of Communication with Friends and Family: More than three times a week    Frequency of Social Gatherings with Friends and Family: Once a week    Attends Religious Services: More than 4 times per year    Active Member of Clubs or Organizations: Yes    Attends Banker Meetings: More than 4 times per year    Marital Status: Married  Catering Manager Violence: Not At Risk (03/05/2024)   Epic    Fear of Current or Ex-Partner: No    Emotionally Abused: No    Physically Abused: No    Sexually Abused: No  Depression (PHQ2-9): Low Risk (04/28/2024)   Depression (PHQ2-9)    PHQ-2 Score: 0  Alcohol Screen: Low Risk (06/06/2024)   Alcohol Screen    Last Alcohol Screening Score (AUDIT): 1  Housing: Low Risk (06/06/2024)   Epic    Unable to Pay for Housing in the Last Year: No    Number of Times Moved in the Last Year: 0    Homeless in the Last Year: No  Utilities: Not At Risk (03/05/2024)   Epic    Threatened with loss of utilities: No  Health Literacy: Adequate Health Literacy (03/05/2024)   B1300 Health Literacy    Frequency of need for help with medical instructions: Never    FAMILY HISTORY: Family  History  Problem Relation Age of Onset   Diabetes Father    Asthma Father    Hypertension Father    Other Brother    Cancer Maternal Grandmother    Heart disease Maternal Grandmother    Colon cancer Maternal Grandmother    Breast cancer Maternal Grandmother    Cancer Paternal Grandmother    Pancreatic cancer Paternal Grandmother    Heart disease Paternal Grandfather    Thyroid  disease Neg Hx     ALLERGIES:  has no known allergies.  MEDICATIONS:  Current Outpatient Medications  Medication Sig Dispense Refill   Accu-Chek Softclix Lancets lancets 1 each by Other route 4 (four) times daily. 100 each 12   albuterol  (PROAIR  HFA) 108 (90 Base) MCG/ACT inhaler Inhale 2 puffs into the lungs every 6 (six) hours as needed for wheezing or shortness of breath. 1 each 0   ferrous sulfate 325 (65 FE) MG EC tablet Take 325 mg by mouth 3 (three) times daily with meals.     glucose blood (ACCU-CHEK SMARTVIEW) test strip Use as instructed to check blood sugars 100 each 12   norethindrone  (AYGESTIN ) 5 MG tablet Take 1 tablet (5 mg total) by mouth in the morning and at bedtime. 60 tablet 1   sertraline (ZOLOFT) 50 MG tablet Take 50 mg by mouth every morning.     ondansetron  (ZOFRAN -ODT) 4 MG disintegrating tablet Take 1 tablet (4 mg total) by mouth every 8 (eight) hours as needed for nausea or vomiting. (Patient not taking: Reported on 06/07/2024) 20 tablet 0   No current facility-administered medications for this visit.    Review of Systems  Constitutional:  Positive for fatigue. Negative for appetite change, chills and fever.  HENT:   Negative for hearing loss and voice change.   Eyes:  Negative for eye problems.  Respiratory:  Negative for chest tightness and cough.   Cardiovascular:  Negative for chest pain.  Gastrointestinal:  Negative for abdominal distention, abdominal pain and blood in stool.  Endocrine:  Negative for hot flashes.  Genitourinary:  Positive for menstrual problem. Negative  for difficulty urinating and frequency.   Musculoskeletal:  Negative for arthralgias.  Skin:  Negative for itching and rash.  Neurological:  Negative for extremity weakness.  Hematological:  Negative for adenopathy.  Psychiatric/Behavioral:  Negative for confusion.    PHYSICAL EXAMINATION:  Vitals:   06/07/24 1011  BP: 107/74  Pulse: 88  Resp: 20  Temp: 98.6 F (37 C)  SpO2: 100%   Filed Weights   06/07/24 1011  Weight: 193 lb 9.6 oz (87.8 kg)    Physical Exam Constitutional:      General: She is not in acute distress. HENT:     Head: Normocephalic and atraumatic.  Eyes:     General: No scleral icterus. Cardiovascular:     Rate and Rhythm: Normal rate and regular rhythm.     Heart sounds: Normal heart sounds.  Pulmonary:     Effort: Pulmonary effort is normal. No respiratory distress.     Breath sounds: No wheezing.  Abdominal:     General: Bowel sounds are normal. There is no distension.     Palpations: Abdomen is soft.  Musculoskeletal:        General: No deformity. Normal range of motion.     Cervical back: Normal range of motion and neck supple.  Skin:    General: Skin is warm and dry.     Findings: No erythema or rash.  Neurological:     Mental Status: She is alert and oriented to person, place, and time. Mental status is at baseline.     Cranial Nerves: No cranial nerve deficit.  Psychiatric:        Mood and Affect: Mood normal.     LABORATORY DATA:  I have reviewed the data as listed    Latest Ref Rng & Units 06/02/2024    8:05 AM 05/16/2024    7:19 AM 04/14/2024    8:03 AM  CBC  WBC 4.0 - 10.5 K/uL 6.5  5.9  4.9   Hemoglobin 12.0 - 15.0 g/dL 9.9  89.1  9.5   Hematocrit 36.0 - 46.0 % 31.7  34.5  32.0   Platelets 150 - 400 K/uL 297  308  396       Latest Ref Rng & Units 05/16/2024    7:19 AM 03/02/2024    8:44 AM 07/22/2022   11:22 AM  CMP  Glucose 70 - 99 mg/dL 860  880  89   BUN 6 - 20 mg/dL 12  13  8    Creatinine 0.44 - 1.00 mg/dL 9.28   9.37  9.32   Sodium 135 - 145 mmol/L 139  137  141   Potassium 3.5 - 5.1 mmol/L 3.4  4.2  4.0   Chloride 98 - 111 mmol/L 106  102  104   CO2 22 - 32 mmol/L 20  28  23    Calcium 8.9 - 10.3 mg/dL 8.8  9.0  8.9   Total Protein 6.5 - 8.1 g/dL 7.2  7.0  7.1   Total Bilirubin 0.0 - 1.2 mg/dL <9.7  0.4  <9.7   Alkaline Phos 38 - 126 U/L 70  70  81   AST 15 - 41 U/L 19  10  12    ALT 0 - 44 U/L 14  6  5        RADIOGRAPHIC STUDIES: I have personally reviewed the radiological images as listed and agreed with the findings in the report.  MR PELVIS W WO CONTRAST Result Date: 05/14/2024 CLINICAL DATA:  Further evaluation of leiomyomata on sonohysterogram EXAM: MRI PELVIS WITHOUT AND WITH CONTRAST TECHNIQUE: Multiplanar multisequence MR imaging of the pelvis was performed both before and after administration of intravenous contrast. CONTRAST:  9mL GADAVIST  GADOBUTROL  1 MMOL/ML IV SOLN COMPARISON:  Sonohysterogram dated 04/28/2024 FINDINGS: Urinary Tract:  No abnormality visualized. Bowel:  Unremarkable visualized pelvic bowel loops. Vascular/Lymphatic: No pathologically enlarged lymph nodes. No significant vascular abnormality seen. Reproductive: Uterus measures 10.1 cm in sagittal dimension. The endometrium is effaced and not well seen secondary to the large leiomyoma. Normal bilateral ovaries. Heterogeneously T2 hypointense submucosal mass corresponding to the finding on prior sonohysterogram measures 6.1 x 5.6 x 5.5 cm (3:19, 6:15). This mass appears to consist of 3 distinct components with different signal characteristics, for example the component along the left fundus measuring 3.2 x 1.9 cm (3:19) demonstrates heterogeneous hypoenhancement (16:42), while the dominant posteroinferior component measuring 5.0 x 3.8 cm (3:17) contains an area of non enhancement along the right lateral aspect (16:42). The remaining, most T2 hypointense component along the right fundus measures 1.8 x 1.5 cm (3:17) and  demonstrates diffuse homogeneous enhancement. Other:  None. Musculoskeletal: No suspicious bone lesions identified. IMPRESSION: 1. Large submucosal mass corresponding to the finding on prior sonohysterogram measures 6.1 x 5.6 x 5.5 cm and appears to consist of 3 distinct components with different signal characteristics, as described above, likely reflecting leiomyomata with varying degrees of degeneration. 2. Normal bilateral ovaries. Electronically Signed   By: Limin  Xu M.D.   On: 05/14/2024 16:36   US  Sonohysterogram Result Date: 04/30/2024 Sonohysterogram: Written consent obtained.  Procedure reviewed. Questions answered. Speculum placed.  Cervix visualized and cleansed with betadine x 3.    A tenaculum  was applied to the cervix.  Dilation of the cervix was not necessary. The catheter was passed into the uterus and sterile saline introduced.  After procedure was completed, catheter and tenaculum removed.  Minimal bleeding noted.  Speculum removed.  Pt tolerated procedure well. Findings noted: 4.4 x 6.6cm fibroid complex with what appears to be at least two fibroid appearing lesions that are elevated the endometrium but are not submucosal and are completely within the myometrium.  Findings c/w Type 3 fibroid(s)   CT Angio Chest Pulmonary Embolism (PE) W or WO Contrast Result Date: 04/13/2024 CLINICAL DATA:  Elevated D-dimer. Limited evaluation of the peripheral pulmonary arteries on a chest CTA dated 03/03/2024 due to contrast bolus timing. EXAM: CT ANGIOGRAPHY CHEST WITH CONTRAST TECHNIQUE: Multidetector CT imaging of the chest was performed using the standard protocol during bolus administration of intravenous contrast. Multiplanar CT image reconstructions and MIPs were obtained to evaluate the vascular anatomy. RADIATION DOSE REDUCTION: This exam was performed according to the departmental dose-optimization program which includes automated exposure control, adjustment of the mA and/or kV according to  patient size and/or use of iterative reconstruction technique. CONTRAST:  OMNIPAQUE  IOHEXOL  350 MG/ML SOLN COMPARISON:  03/03/2024. FINDINGS: Cardiovascular: Excellent opacification of the pulmonary arteries with no pulmonary arterial filling defects seen. Normal sized heart. No pericardial effusion. Mediastinum/Nodes: No enlarged mediastinal, hilar, or axillary lymph nodes. Thyroid  gland, trachea, and esophagus demonstrate no significant findings. Lungs/Pleura: Minimal bilateral dependent atelectasis. The previously demonstrated 2 mm nodule in the right lower lobe is again demonstrated on image number 75/6. This is adjacent to the major fissure, suggesting a small, normal perifissural lymph node. No other lung nodules and no pleural fluid. Upper Abdomen: Cholecystectomy clips. Musculoskeletal: Mild  thoracic spine degenerative changes. Review of the MIP images confirms the above findings. IMPRESSION: 1. No pulmonary emboli or other acute abnormality. 2. Stable 2 mm right lower lobe nodule, most likely a normal perifissural lymph node. This does not need imaging follow-up unless otherwise clinically indicated. Electronically Signed   By: Elspeth Bathe M.D.   On: 04/13/2024 10:37   US  Venous Img Lower Bilateral Result Date: 03/11/2024 CLINICAL DATA:  Anemia, elevated D-dimer and exertional dyspnea. EXAM: BILATERAL LOWER EXTREMITY VENOUS DOPPLER ULTRASOUND TECHNIQUE: Gray-scale sonography with graded compression, as well as color Doppler and duplex ultrasound were performed to evaluate the lower extremity deep venous systems from the level of the common femoral vein and including the common femoral, femoral, profunda femoral, popliteal and calf veins including the posterior tibial, peroneal and gastrocnemius veins when visible. The superficial great saphenous vein was also interrogated. Spectral Doppler was utilized to evaluate flow at rest and with distal augmentation maneuvers in the common femoral, femoral  and popliteal veins. COMPARISON:  None Available. FINDINGS: RIGHT LOWER EXTREMITY Common Femoral Vein: No evidence of thrombus. Normal compressibility, respiratory phasicity and response to augmentation. Saphenofemoral Junction: No evidence of thrombus. Normal compressibility and flow on color Doppler imaging. Profunda Femoral Vein: No evidence of thrombus. Normal compressibility and flow on color Doppler imaging. Femoral Vein: No evidence of thrombus. Normal compressibility, respiratory phasicity and response to augmentation. Popliteal Vein: No evidence of thrombus. Normal compressibility, respiratory phasicity and response to augmentation. Calf Veins: No evidence of thrombus. Normal compressibility and flow on color Doppler imaging. Superficial Great Saphenous Vein: No evidence of thrombus. Normal compressibility. Venous Reflux:  None. Other Findings: No evidence of superficial thrombophlebitis or abnormal fluid collection. LEFT LOWER EXTREMITY Common Femoral Vein: No evidence of thrombus. Normal compressibility, respiratory phasicity and response to augmentation. Saphenofemoral Junction: No evidence of thrombus. Normal compressibility and flow on color Doppler imaging. Profunda Femoral Vein: No evidence of thrombus. Normal compressibility and flow on color Doppler imaging. Femoral Vein: No evidence of thrombus. Normal compressibility, respiratory phasicity and response to augmentation. Popliteal Vein: No evidence of thrombus. Normal compressibility, respiratory phasicity and response to augmentation. Calf Veins: No evidence of thrombus. Normal compressibility and flow on color Doppler imaging. Superficial Great Saphenous Vein: No evidence of thrombus. Normal compressibility. Venous Reflux:  None. Other Findings: No evidence of superficial thrombophlebitis or abnormal fluid collection. IMPRESSION: No evidence of deep venous thrombosis in either lower extremity. Electronically Signed   By: Marcey Moan M.D.    On: 03/11/2024 10:13

## 2024-06-07 NOTE — Progress Notes (Signed)
 [facility] Counselor/Therapist Progress Note  Patient ID: Diane [name], MRN: [id number],    Date: 06/07/2024  Time Spent: 3:32pm - 4:21pm : 49 minutes   Treatment Type: Individual Therapy  Reported Symptoms: feeling overwhelmed  Mental Status Exam: Appearance:  Neat and Well Groomed     Behavior: Appropriate  Motor: Normal  Speech/Language:  Clear and Coherent and Normal Rate  Affect: Appropriate  Mood: overwhelmed  Thought process: normal  Thought content:   WNL  Sensory/Perceptual disturbances:   WNL  Orientation: oriented to person, place, time/date, and situation  Attention: Good  Concentration: Good  Memory: WNL  Fund of knowledge:  Good  Insight:   Good  Judgment:  Good  Impulse Control: Good   Risk Assessment: Danger to Self:  No  Patient denied current suicidal ideation  Self-injurious Behavior: No Danger to Others: No Patient denied current homicidal ideation Duty to Warn:no Physical Aggression / Violence:No  Access to Firearms a concern: No  Gang Involvement:No   Subjective: Patient stated, I have my surgery scheduled for December 30 th (2025) and stated, after a lot of consideration I decided to go forward with the hysterectomy. Patient reported the risks of fibroids returning and stated, just wondering if and when they (fibroids) will return contributed to patient's decision to schedule hysterectomy.  Patient stated, not that I'm happy about having a hysterectomy, I feel like its the right decision. Patient stated, I feel like it would be in my head what's happening, is something growing in reference to choosing alternative surgery option. Patient stated, I think theres just some grief with closing that door in reference to having additional children. Patient stated, having a plan is helpful in reference to surgery. Patient reported feeling overwhelmed due to recent stressors (husband had the flu, children are exhibiting symptoms,  vehicle issues,  purchasing a new vehicle). Patient stated,  I do feel like the Zoloft is helping at least and I don't feel like things are spinning out of control. Patient reported patient practiced mindfulness exercise of 5 senses and deep breathing in response to stressors. Patient stated, I felt like it was easier, it felt more able to be controlled in reference to managing symptoms of anxiety. Patient reported  constantly feeling that shoes going to drop in reference to recent stressors.   Interventions: Cognitive Behavioral Therapy. Clinician conducted session via caregility video from clinician's home office. Patient provided verbal consent to proceed with telehealth session and is aware of limitations of telephone or video visits. Patient participated in session from patient's home. Reviewed events since last session and assessed for changes. Discussed status of patient's health and decision regarding upcoming surgery. Explored patient's thoughts and feelings related to upcoming surgery and patient's decision. Reviewed coping strategies patient has practiced since last session. Discussed use of positive self talk.   Collaboration of Care: not required at this time   Diagnosis:  Generalized anxiety disorder   Adjustment disorder with depressed mood    Plan: Patient is to utilize [employer] Therapy, thought re-framing, behavioral activation, mindfulness and coping strategies to decrease symptoms associated with their diagnosis. Frequency: bi-weekly  Modality: individual      Long-term goal:   Reduce overall level, frequency, and intensity of the feelings of anxiety as evidenced by decreased worrying about worse case scenario situations, irritability, overstimulation by noises in the environment, my mind just starts spiraling, nausea when feeling anxious, shaky feeling, feeling on edge, lack of motivation when anxious, difficulty concentrating, fatigue and  increased sleep  when feeling anxious from 6 to 7 days/week to 0 to 1 days/week per patient report for at least 3 consecutive months. Target Date: 11/03/24  Progress: progressing    Short-term goal:  Develop and implement mindfulness strategies to increase patient being present when spending time with family and other activities Target Date: 11/23/24  Progress: progressing    Increase and maintain daily self care strategies, such as, drinking water, eating 3 healthy meals per day, healthy sleep habits, exercising, and work/life balance Target Date: 11/23/24  Progress: progressing    Develop and implement coping strategies and effective stress management strategies to decrease feelings of irritability and negative responses to stress Target Date: 11/23/24  Progress: progressing    Identify, challenge, and replace negative thought patterns that contribute to feelings of guilt and anxiety with positive thoughts and beliefs per patient's report Target Date: 11/23/24  Progress: progressing    Begin healthy grieving process Target Date: 11/23/24  Progress: progressing       Diane Seats, LCSW

## 2024-06-07 NOTE — Progress Notes (Signed)
   Darice Seats, LCSW

## 2024-06-07 NOTE — Assessment & Plan Note (Addendum)
 Lab Results  Component Value Date   HGB 9.9 (L) 06/02/2024   TIBC 249 (L) 06/02/2024   IRONPCTSAT 12 06/02/2024   FERRITIN 52 06/02/2024    Hemoglobin has improved but not normalized yet. Improved iron  panel  Given her ongoing blood loss, recommend Venofer  x 1 dose to further improve iron  store.

## 2024-06-07 NOTE — Assessment & Plan Note (Signed)
 Negative  factor V Leiden mutation,negative prothrombin gene mutation

## 2024-06-07 NOTE — Telephone Encounter (Signed)
 Copied from CRM #[id number]. Topic: Clinical - Request for Lab/Test Order >> Jun 07, 2024 11:00 AM Diane [name] wrote: Reason for CRM: Patient called in regarding getting labs before her physical appointment on 12/18 would like a call once orders are in to be scheduled

## 2024-06-08 ENCOUNTER — Encounter (HOSPITAL_COMMUNITY): Payer: Self-pay | Admitting: Obstetrics & Gynecology

## 2024-06-08 ENCOUNTER — Other Ambulatory Visit

## 2024-06-08 DIAGNOSIS — E1165 Type 2 diabetes mellitus with hyperglycemia: Secondary | ICD-10-CM

## 2024-06-08 LAB — LIPID PANEL
Cholesterol: 176 mg/dL (ref 28–200)
HDL: 43 mg/dL (ref >39.00)
LDL Cholesterol: 121 mg/dL — ABNORMAL HIGH (ref 0–99)
NonHDL: 133.39
Total CHOL/HDL Ratio: 4
Triglycerides: 63 mg/dL (ref 0.0–149.0)
VLDL: 12.6 mg/dL (ref 0.0–40.0)

## 2024-06-08 LAB — HEMOGLOBIN A1C: Hgb A1c MFr Bld: 5.1 % (ref 4.6–6.5)

## 2024-06-08 NOTE — Progress Notes (Signed)
 Spoke w/ via phone for pre-op interview---Diane [name] needs dos---- UPT day of surgery. [name] appt. 06/21/24 @ 1130 CBC and T&S.        [name] results------Current EKG in Epic dated 05/17/24. COVID test -----patient states asymptomatic no test needed Arrive at -------0530 NPO after MN NO Solid Food.   Pre-Surgery Ensure or G2:  Med rec completed Medications to take morning of surgery -----Bring Albuterol  inhaler. Aygestin  and Zoloft. Diabetic medication -----  GLP1 agonist last dose: GLP1 instructions:  Patient instructed no nail polish to be worn day of surgery Patient instructed to bring photo id and insurance card day of surgery Patient aware to have Driver (ride ) / caregiver    for 24 hours after surgery - Husband Diane [name] Patient Special Instructions -----CHG shower night before surgery. Pre-Op special Instructions -----  Patient verbalized understanding of instructions that were given at this phone interview. Patient denies chest pain, sob, fever, cough at the interview.

## 2024-06-08 NOTE — Progress Notes (Signed)
 Surgical Instructions  Your procedure is scheduled on :  Tuesday December 30th, 2025 Report to [facility] Main Entrance A at 5:30 AM, then check in the Admitting office. Any questions or running late day of surgery :  call [phone]  Questions prior to your surgery day:  call [phone], Monday -- Friday 8am - 4pm. If you experience any cold or flu symptoms such as cough, fever, chills, shortness of breath, etc. between now and you scheduled surgery, please notify your surgeon office.   Remember: Do Not eat any food after midnight the night before surgery.     This includes No water,  candy,  gum, and mints.  Take these medicines the morning of surgery with A SIPS OF WATER:  Aygestin  and Zoloft. Bring Albuterol  inhaler.   May take these medicines IF NEEDED:  NONE   One week prior to surgery, STOP taking any Aspirin (unless otherwise instructed by your surgeon) Aleve , Naproxen , ibuprofen , Motrin , Advil , Goody's, BC's, all herbal medications/ supplements, fish oil, and non-prescription vitamins.  Do NOT Smoke (tobacco/ vaping) and Do Not drink alcohol for 24 hours prior to your procedure.  For those patients that use a CPAP.  Please bring your CPAP/ mask/ tubing with them day of surgery . Anesthesia may ask recovery room nurse to use and if you stay the night you be asked to use it.  You will be asked to removed any contacts, glasses, piercing's, hearing aid's, dentures/ partials prior to surgery.  Please bring cases/ container/ solution/ etc., for them day of surgery.   Patients discharged the day of surgery will NOT be allowed to drive home.  You must have responsible driver and caregiver to stay at home with you the next 24 hours.  SURGICAL WAITING ROOM VISITATION Patients may have no more than 2 support people in the waiting area - if more than 2 , these visitors may rotate.  Pre-op nurse will coordinate an appropriate time for 1 Adult support person, who may not rotate, to  accompany patient in pre-op.  Aware some patients may have certain circumstances, speak to pre-op nurse day of surgery.  Children under the age 67 must have an adult with them who is not the patient and must remain in the main waiting area with an adult.  If the patient needs to stay at the hospital during part of their recovery, the visitor guidelines for inpatient rooms apply.  Please refer to the [facility] website for the visitor guidelines for any additional information.  If you received a COVID test during your pre-op visit it is requested that you wear a mask when out in public, stay away from anyone that may not be feeling well and notify your surgeon if you develop symptoms.  If you have been in contact with anyone that has tested positive in the past 10 days notify your surgeon.     [facility] - Preparing for Surgery  Before surgery, you can play an important role. Because skin is not sterile, it needs to be as free of germs as possible. You can reduce the number of germs on your skin by washing with CHG (chlorhexidine  gluconate) soap before surgery. CHG is an antiseptic cleaner which kills germs and bonds with the skin to continue killing germs even after washing. Oral hygiene is also important in reducing the risk of infection. Remember to brush your teeth with your regular toothpaste the morning of surgery.  Please DO NOT use if you have an allergy  to CHG or antibacterial soaps. If your skin becomes reddened/irritated stop using the CHG and inform your Pre-op nurse day of surgery.  DO NOT shave (including legs and genital area) for at least 48 hours prior to your CHG shower.   Please follow these instructions carefully:  Shower with CHG soap the night before surgery. If you choose to wash your hair, wash your hair first as usual with your normal shampoo. After you shampoo, rinse your hair and body thoroughly to remove the shampoo. Use CHG as you would any other liquid soap. You  can apply CHG directly to the skin and wash gently with a clean washcloth or shower sponge. Apply the CHG soap to your body ONLY FROM THE NECK DOWN. Do not use on open wounds or open sores. Avoid contact with your eyes, ears, mouth, and genitals (private parts). Wash genitals (private parts) with your normal soap. Wash thoroughly, paying special attention to the area where your surgery will be performed. Thoroughly rinse your body with warm water from the neck down. DO NOT shower/wash with your normal soap after using and rinsing off the CHG soap. DO NOT use lotions, oils, etc., after showering with CHG. Pat yourself dry with a clean towel. Wear clean pajamas. Place clean sheets on your bed the night of your CHG shower and do not sleep with pets.  Day of Surgery  DO NOT Apply any lotions,  powder,  oils,  deodorants (may use underarm deodorant),  cologne/  perfumes  or makeup Do Not wear jewelry /  piercing's/  metal/  permanent jewelry must be removed prior to arrival day of surgery. (No plastic piercing) Do Not wear nail polish,  gel polish,  artificial nails, or any other type of covering on natural finger nails (toe nails are okay) Remember to brush your teeth and rinse mouth out. Put on clean / comfortable clothes. [facility] is not responsible for valuables/ personal belongings

## 2024-06-09 ENCOUNTER — Ambulatory Visit: Payer: Self-pay | Admitting: Primary Care

## 2024-06-10 ENCOUNTER — Ambulatory Visit: Admitting: Primary Care

## 2024-06-10 ENCOUNTER — Encounter: Payer: Self-pay | Admitting: Primary Care

## 2024-06-10 VITALS — BP 116/66 | HR 101 | Temp 98.1°F | Ht 65.0 in | Wt 190.4 lb

## 2024-06-10 DIAGNOSIS — Z0000 Encounter for general adult medical examination without abnormal findings: Secondary | ICD-10-CM | POA: Diagnosis not present

## 2024-06-10 DIAGNOSIS — E119 Type 2 diabetes mellitus without complications: Secondary | ICD-10-CM | POA: Diagnosis not present

## 2024-06-10 DIAGNOSIS — J4520 Mild intermittent asthma, uncomplicated: Secondary | ICD-10-CM

## 2024-06-10 DIAGNOSIS — E039 Hypothyroidism, unspecified: Secondary | ICD-10-CM

## 2024-06-10 DIAGNOSIS — F411 Generalized anxiety disorder: Secondary | ICD-10-CM

## 2024-06-10 DIAGNOSIS — D509 Iron deficiency anemia, unspecified: Secondary | ICD-10-CM | POA: Diagnosis not present

## 2024-06-10 DIAGNOSIS — R42 Dizziness and giddiness: Secondary | ICD-10-CM

## 2024-06-10 DIAGNOSIS — E785 Hyperlipidemia, unspecified: Secondary | ICD-10-CM

## 2024-06-10 NOTE — Assessment & Plan Note (Signed)
 Controlled.  Continue Zoloft 50 mg daily.

## 2024-06-10 NOTE — Assessment & Plan Note (Signed)
 Slightly elevated, HDL is higher.   She will work on lifestyle changes.  Continue to monitor.

## 2024-06-10 NOTE — Assessment & Plan Note (Signed)
 Improved!  Continue ferrous sulfate 325 mg daily.  Reviewed labs December 2025.

## 2024-06-10 NOTE — Assessment & Plan Note (Signed)
 Secondary to iron  deficiency from menorrhagia.  Improved!  Continue ferrous sulfate 325 mg daily. Continue follow-up with GYN for hysterectomy

## 2024-06-10 NOTE — Assessment & Plan Note (Signed)
 Controlled.  Reviewed TSH from September 2025. Remain off treatment.

## 2024-06-10 NOTE — Progress Notes (Signed)
 Subjective:    Patient ID: Diane [name], female    DOB: 1986-02-08, 38 y.o.   MRN: [id number]  Diane [name] is a very pleasant 38 y.o. female who presents today for complete physical and follow up of chronic conditions.  Immunizations: -Tetanus: Completed in 2018 -Influenza: Completed this season  -HPV: Completed series  Diet: Fair diet.  Exercise: No regular exercise.  Eye exam: Completes annually  Dental exam: Completes semi-annually    Pap Smear: Completed in 2023  BP Readings from Last 3 Encounters:  06/10/24 116/66  06/07/24 115/82  06/07/24 107/74   Wt Readings from Last 3 Encounters:  06/10/24 190 lb 6 oz (86.4 kg)  06/07/24 193 lb 9.6 oz (87.8 kg)  06/03/24 193 lb (87.5 kg)       Review of Systems  Constitutional:  Negative for unexpected weight change.  HENT:  Negative for rhinorrhea.   Respiratory:  Negative for cough and shortness of breath.   Cardiovascular:  Negative for chest pain.  Gastrointestinal:  Negative for constipation and diarrhea.  Genitourinary:  Positive for menstrual problem. Negative for difficulty urinating.  Musculoskeletal:  Negative for arthralgias and myalgias.  Skin:  Negative for rash.  Allergic/Immunologic: Negative for environmental allergies.  Neurological:  Negative for dizziness and headaches.  Psychiatric/Behavioral:  The patient is not nervous/anxious.          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 02/2022 Zio: predominantly sinus rhythm @ 85 (53-162). 1 symptomatic run of SVT @ 162 x 16 beats. Rare PACs.   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   2 children.   Masters in Programme Researcher, Broadcasting/film/video.   Enjoys reading, photography, crafting.    Social Drivers of Health   Tobacco Use: Low Risk (06/10/2024)   Patient History    Smoking Tobacco Use: Never    Smokeless Tobacco Use: Never    Passive Exposure: Not on file  Financial Resource Strain: Low  Risk (06/06/2024)   Overall Financial Resource Strain (CARDIA)    Difficulty of Paying Living Expenses: Not very hard  Food Insecurity: No Food Insecurity (06/06/2024)   Epic    Worried About Programme Researcher, Broadcasting/film/video in the Last Year: Never true    Ran Out of Food in the Last Year: Never true  Transportation Needs: No Transportation Needs (06/06/2024)   Epic    Lack of Transportation (Medical): No    Lack of Transportation (Non-Medical): No  Physical Activity: Inactive (06/06/2024)   Exercise Vital Sign    Days of Exercise per Week: 0 days    Minutes of Exercise per Session: Not on file  Stress: No Stress Concern Present (06/06/2024)   [employer] of Occupational Health - Occupational  Stress Questionnaire    Feeling of Stress: Only a little  Social Connections: Socially Integrated (06/06/2024)   Social Connection and Isolation Panel    Frequency of Communication with Friends and Family: More than three times a week    Frequency of Social Gatherings with Friends and Family: Once a week    Attends Religious Services: More than 4 times per year    Active Member of Clubs or Organizations: Yes    Attends Banker Meetings: More than 4 times per year    Marital Status: Married  Catering Manager Violence: Not At Risk (03/05/2024)   Epic    Fear of Current or Ex-Partner: No    Emotionally Abused: No    Physically Abused: No    Sexually Abused: No  Depression (PHQ2-9): Low Risk (04/28/2024)   Depression (PHQ2-9)    PHQ-2 Score: 0  Alcohol Screen: Low Risk (06/06/2024)   Alcohol Screen    Last Alcohol Screening Score (AUDIT): 1  Housing: Low Risk (06/06/2024)   Epic    Unable to Pay for Housing in the Last Year: No    Number of Times Moved in the Last Year: 0    Homeless in the Last Year: No  Utilities: Not At Risk (03/05/2024)   Epic    Threatened with loss of utilities: No  Health Literacy: Adequate Health Literacy (03/05/2024)   B1300 Health Literacy    Frequency of need for help with medical instructions: Never    Past Surgical History:  Procedure Laterality Date   CHOLECYSTECTOMY N/A 11/28/2017   Procedure: LAPAROSCOPIC CHOLECYSTECTOMY WITH INTRAOPERATIVE CHOLANGIOGRAM;  Surgeon: Gladis Cough, MD;  Location: [facility] ORS;  Service: General;  Laterality: N/A;   LAPAROSCOPY  2009    Family History  Problem Relation Age of Onset   Diabetes Father    Asthma Father    Hypertension Father    Heart disease Father    Other Brother    Cancer Maternal Grandmother    Heart disease Maternal Grandmother    Colon cancer Maternal Grandmother    Breast cancer Maternal Grandmother    Cancer Paternal Grandmother    Pancreatic cancer Paternal Grandmother     Heart disease Paternal Grandfather    Heart disease Maternal Grandfather    Heart disease Maternal Uncle    Thyroid  disease Neg Hx     Allergies[1]  Medications Ordered Prior to Encounter[2]  BP 116/66   Pulse (!) 101   Temp 98.1 F (36.7 C) (Oral)   Ht 5' 5 (1.651 m)   Wt 190 lb 6 oz (86.4 kg)   LMP  (Within Months)   SpO2 98%   BMI 31.68 kg/m  Objective:   Physical Exam HENT:  Right Ear: Tympanic membrane and ear canal normal.     Left Ear: Tympanic membrane and ear canal normal.  Eyes:     Pupils: Pupils are equal, round, and reactive to light.  Cardiovascular:     Rate and Rhythm: Normal rate and regular rhythm.  Pulmonary:     Effort: Pulmonary effort is normal.     Breath sounds: Normal breath sounds.  Abdominal:     General: Bowel sounds are normal.     Palpations: Abdomen is soft.     Tenderness: There is no abdominal tenderness.  Musculoskeletal:        General: Normal range of motion.     Cervical back: Neck supple.  Skin:    General: Skin is warm and dry.  Neurological:     Mental Status: She is alert and oriented to person, place, and time.     Cranial Nerves: No cranial nerve deficit.     Deep Tendon Reflexes:     Reflex Scores:      Patellar reflexes are 2+ on the right side and 2+ on the left side. Psychiatric:        Mood and Affect: Mood normal.     Physical Exam        Assessment & Plan:  Preventative health care Assessment & Plan: Immunizations UTD.  Pap smear UTD.  Discussed the importance of a healthy diet and regular exercise in order for weight loss, and to reduce the risk of further co-morbidity.  Exam stable. Labs pending.  Follow up in 1 year for repeat physical.    Mild intermittent asthma without complication Assessment & Plan: Controlled.  Continue albuterol  inhaler PRN.    Hypothyroidism, unspecified type Assessment & Plan: Controlled.  Reviewed TSH from September 2025. Remain off treatment.     GAD (generalized anxiety disorder) Assessment & Plan: Controlled.  Continue Zoloft 50 mg daily.   Hyperlipidemia, unspecified hyperlipidemia type Assessment & Plan: Slightly elevated, HDL is higher.   She will work on lifestyle changes.  Continue to monitor.    Iron  deficiency anemia, unspecified iron  deficiency anemia type Assessment & Plan: Improved!  Continue ferrous sulfate 325 mg daily.  Reviewed labs December 2025.     Lightheadedness Assessment & Plan: Secondary to iron  deficiency from menorrhagia.  Improved!  Continue ferrous sulfate 325 mg daily. Continue follow-up with GYN for hysterectomy   Controlled type 2 diabetes mellitus (HCC) Assessment & Plan: Improved and controlled with A1C today of 5.1!!!  Continue to work on lifestyle changes! Remain off treatment.  Follow up in 6 months.     Assessment and Plan Assessment & Plan         Comer MARLA Gaskins, NP       [1] No Known Allergies [2]  Current Outpatient Medications on File Prior to Visit  Medication Sig Dispense Refill   Accu-Chek Softclix Lancets lancets 1 each by Other route 4 (four) times daily. 100 each 12   albuterol  (PROAIR  HFA) 108 (90 Base) MCG/ACT inhaler Inhale 2 puffs into the lungs every 6 (six) hours as needed for wheezing or shortness of breath. 1 each 0   ferrous sulfate 325 (65 FE) MG EC tablet Take 325 mg by mouth 3 (three) times daily with meals.     glucose blood (ACCU-CHEK SMARTVIEW) test strip Use as instructed to check blood sugars 100 each 12   norethindrone  (AYGESTIN ) 5 MG tablet Take 1 tablet (5 mg total) by mouth in the morning and at bedtime.  60 tablet 1   sertraline (ZOLOFT) 50 MG tablet Take 50 mg by mouth every morning.     No current facility-administered medications on file prior to visit.

## 2024-06-10 NOTE — Assessment & Plan Note (Signed)
 Improved and controlled with A1C today of 5.1!!!  Continue to work on lifestyle changes! Remain off treatment.  Follow up in 6 months.

## 2024-06-10 NOTE — Assessment & Plan Note (Signed)
 Immunizations UTD. Pap smear UTD.  Discussed the importance of a healthy diet and regular exercise in order for weight loss, and to reduce the risk of further co-morbidity.  Exam stable. Labs pending.  Follow up in 1 year for repeat physical.

## 2024-06-10 NOTE — Patient Instructions (Signed)
 Keep up the great work!!!  Please schedule a follow up visit for 6 months for a diabetes check.  It was a pleasure to see you today!

## 2024-06-10 NOTE — Assessment & Plan Note (Signed)
 Controlled.  Continue albuterol inhaler PRN

## 2024-06-21 ENCOUNTER — Encounter (HOSPITAL_COMMUNITY): Payer: Self-pay | Admitting: Obstetrics & Gynecology

## 2024-06-21 ENCOUNTER — Ambulatory Visit: Admitting: Clinical

## 2024-06-21 ENCOUNTER — Encounter (HOSPITAL_COMMUNITY)
Admission: RE | Admit: 2024-06-21 | Discharge: 2024-06-21 | Disposition: A | Discharge status: Home / Self Care | Source: Ambulatory Visit | Attending: Obstetrics & Gynecology | Admitting: Obstetrics & Gynecology

## 2024-06-21 DIAGNOSIS — Z01812 Encounter for preprocedural laboratory examination: Secondary | ICD-10-CM | POA: Insufficient documentation

## 2024-06-21 DIAGNOSIS — N921 Excessive and frequent menstruation with irregular cycle: Secondary | ICD-10-CM | POA: Diagnosis not present

## 2024-06-21 DIAGNOSIS — D509 Iron deficiency anemia, unspecified: Secondary | ICD-10-CM | POA: Insufficient documentation

## 2024-06-21 DIAGNOSIS — Z01818 Encounter for other preprocedural examination: Secondary | ICD-10-CM | POA: Diagnosis present

## 2024-06-21 DIAGNOSIS — D250 Submucous leiomyoma of uterus: Secondary | ICD-10-CM | POA: Insufficient documentation

## 2024-06-21 LAB — CBC
HCT: 38.1 % (ref 36.0–46.0)
Hemoglobin: 11.8 g/dL — ABNORMAL LOW (ref 12.0–15.0)
MCH: 26.2 pg (ref 26.0–34.0)
MCHC: 31 g/dL (ref 30.0–36.0)
MCV: 84.5 fL (ref 80.0–100.0)
Platelets: 350 K/uL (ref 150–400)
RBC: 4.51 MIL/uL (ref 3.87–5.11)
RDW: 18.6 % — ABNORMAL HIGH (ref 11.5–15.5)
WBC: 5.1 K/uL (ref 4.0–10.5)
nRBC: 0 % (ref 0.0–0.2)

## 2024-06-21 LAB — TYPE AND SCREEN
ABO/RH(D): O POS
Antibody Screen: NEGATIVE

## 2024-06-22 ENCOUNTER — Ambulatory Visit (HOSPITAL_COMMUNITY)

## 2024-06-22 ENCOUNTER — Other Ambulatory Visit (HOSPITAL_BASED_OUTPATIENT_CLINIC_OR_DEPARTMENT_OTHER): Payer: Self-pay | Admitting: Obstetrics & Gynecology

## 2024-06-22 ENCOUNTER — Ambulatory Visit (HOSPITAL_COMMUNITY)
Admission: RE | Admit: 2024-06-22 | Discharge: 2024-06-22 | Disposition: A | Discharge status: Home / Self Care | Attending: Obstetrics & Gynecology | Admitting: Obstetrics & Gynecology

## 2024-06-22 ENCOUNTER — Encounter: Payer: Self-pay | Admitting: Oncology

## 2024-06-22 ENCOUNTER — Other Ambulatory Visit (HOSPITAL_COMMUNITY): Payer: Self-pay

## 2024-06-22 ENCOUNTER — Encounter (HOSPITAL_COMMUNITY)
Admission: RE | Disposition: A | Discharge status: Home / Self Care | Payer: Self-pay | Source: Home / Self Care | Attending: Obstetrics & Gynecology

## 2024-06-22 ENCOUNTER — Encounter (HOSPITAL_COMMUNITY): Payer: Self-pay | Admitting: Obstetrics & Gynecology

## 2024-06-22 DIAGNOSIS — E119 Type 2 diabetes mellitus without complications: Secondary | ICD-10-CM

## 2024-06-22 DIAGNOSIS — N920 Excessive and frequent menstruation with regular cycle: Secondary | ICD-10-CM | POA: Diagnosis not present

## 2024-06-22 DIAGNOSIS — N840 Polyp of corpus uteri: Secondary | ICD-10-CM | POA: Diagnosis present

## 2024-06-22 DIAGNOSIS — J45909 Unspecified asthma, uncomplicated: Secondary | ICD-10-CM

## 2024-06-22 DIAGNOSIS — N921 Excessive and frequent menstruation with irregular cycle: Secondary | ICD-10-CM | POA: Diagnosis present

## 2024-06-22 DIAGNOSIS — K219 Gastro-esophageal reflux disease without esophagitis: Secondary | ICD-10-CM | POA: Diagnosis not present

## 2024-06-22 DIAGNOSIS — E039 Hypothyroidism, unspecified: Secondary | ICD-10-CM | POA: Diagnosis not present

## 2024-06-22 DIAGNOSIS — D250 Submucous leiomyoma of uterus: Secondary | ICD-10-CM | POA: Diagnosis present

## 2024-06-22 DIAGNOSIS — D509 Iron deficiency anemia, unspecified: Secondary | ICD-10-CM

## 2024-06-22 DIAGNOSIS — D251 Intramural leiomyoma of uterus: Secondary | ICD-10-CM

## 2024-06-22 DIAGNOSIS — G8918 Other acute postprocedural pain: Secondary | ICD-10-CM

## 2024-06-22 DIAGNOSIS — E785 Hyperlipidemia, unspecified: Secondary | ICD-10-CM | POA: Diagnosis not present

## 2024-06-22 HISTORY — PX: TOTAL LAPAROSCOPIC HYSTERECTOMY WITH BILATERAL SALPINGO OOPHORECTOMY: SHX6845

## 2024-06-22 HISTORY — PX: CYSTOSCOPY: SHX5120

## 2024-06-22 LAB — HEMOGLOBIN: Hemoglobin: 11.7 g/dL — ABNORMAL LOW (ref 12.0–15.0)

## 2024-06-22 LAB — POCT PREGNANCY, URINE: Preg Test, Ur: NEGATIVE

## 2024-06-22 SURGERY — HYSTERECTOMY, TOTAL, LAPAROSCOPIC, WITH BILATERAL SALPINGO-OOPHORECTOMY
Anesthesia: General | Site: Pelvis

## 2024-06-22 MED ORDER — ROCURONIUM BROMIDE 10 MG/ML (PF) SYRINGE
PREFILLED_SYRINGE | INTRAVENOUS | Status: DC | PRN
  Administered 2024-06-22: 10 mg via INTRAVENOUS
  Administered 2024-06-22: 80 mg via INTRAVENOUS
  Administered 2024-06-22: 10 mg via INTRAVENOUS

## 2024-06-22 MED ORDER — ACETAMINOPHEN 325 MG PO TABS
650.0000 mg | ORAL_TABLET | ORAL | Status: DC | PRN
  Administered 2024-06-22: 650 mg via ORAL
  Filled 2024-06-22: qty 2

## 2024-06-22 MED ORDER — ONDANSETRON HCL 4 MG/2ML IJ SOLN: 4.0000 mg | Freq: Four times a day (QID) | INTRAMUSCULAR | Status: DC | PRN

## 2024-06-22 MED ORDER — PHENYLEPHRINE HCL-NACL 20-0.9 MG/250ML-% IV SOLN
INTRAVENOUS | Status: DC | PRN
  Administered 2024-06-22: 20 ug/min via INTRAVENOUS

## 2024-06-22 MED ORDER — BUPIVACAINE HCL (PF) 0.25 % IJ SOLN
INTRAMUSCULAR | Status: DC | PRN
  Administered 2024-06-22: 13 mL

## 2024-06-22 MED ORDER — SODIUM CHLORIDE 0.9 % IV SOLN
INTRAVENOUS | Status: DC | PRN
  Administered 2024-06-22: 60 mL

## 2024-06-22 MED ORDER — AMISULPRIDE (ANTIEMETIC) 5 MG/2ML IV SOLN: 10.0000 mg | Freq: Once | INTRAVENOUS | Status: DC | PRN

## 2024-06-22 MED ORDER — ONDANSETRON HCL 4 MG/2ML IJ SOLN
INTRAMUSCULAR | Status: DC | PRN
  Administered 2024-06-22: 4 mg via INTRAVENOUS

## 2024-06-22 MED ORDER — ROCURONIUM BROMIDE 10 MG/ML (PF) SYRINGE
PREFILLED_SYRINGE | INTRAVENOUS | Status: AC
  Filled 2024-06-22: qty 10

## 2024-06-22 MED ORDER — ALUM & MAG HYDROXIDE-SIMETH 200-200-20 MG/5ML PO SUSP: 30.0000 mL | ORAL | Status: DC | PRN

## 2024-06-22 MED ORDER — LIDOCAINE 2% (20 MG/ML) 5 ML SYRINGE
INTRAMUSCULAR | Status: AC
  Filled 2024-06-22: qty 5

## 2024-06-22 MED ORDER — KETAMINE HCL 50 MG/5ML IJ SOSY
PREFILLED_SYRINGE | INTRAMUSCULAR | Status: AC
  Filled 2024-06-22: qty 5

## 2024-06-22 MED ORDER — PROPOFOL 500 MG/50ML IV EMUL
INTRAVENOUS | Status: DC | PRN
  Administered 2024-06-22: 150 ug/kg/min via INTRAVENOUS
  Administered 2024-06-22: 100 ug/kg/min via INTRAVENOUS
  Administered 2024-06-22: 125 ug/kg/min via INTRAVENOUS

## 2024-06-22 MED ORDER — KETOROLAC TROMETHAMINE 30 MG/ML IJ SOLN
INTRAMUSCULAR | Status: AC
  Filled 2024-06-22: qty 1

## 2024-06-22 MED ORDER — FERROUS SULFATE 325 (65 FE) MG PO TBEC: 325.0000 mg | DELAYED_RELEASE_TABLET | Freq: Every day | ORAL | Status: AC

## 2024-06-22 MED ORDER — OXYCODONE HCL 5 MG/5ML PO SOLN: 5.0000 mg | Freq: Once | ORAL | Status: DC | PRN

## 2024-06-22 MED ORDER — HEMOSTATIC AGENTS (NO CHARGE) OPTIME
TOPICAL | Status: DC | PRN
  Administered 2024-06-22: 1

## 2024-06-22 MED ORDER — ACETAMINOPHEN 500 MG PO TABS
1000.0000 mg | ORAL_TABLET | ORAL | Status: AC
  Administered 2024-06-22: 1000 mg via ORAL

## 2024-06-22 MED ORDER — OXYCODONE-ACETAMINOPHEN 5-325 MG PO TABS
1.0000 | ORAL_TABLET | Freq: Four times a day (QID) | ORAL | 0 refills | Status: AC | PRN
  Filled 2024-06-22: qty 30, 4d supply, fill #0

## 2024-06-22 MED ORDER — KETOROLAC TROMETHAMINE 30 MG/ML IJ SOLN
INTRAMUSCULAR | Status: DC | PRN
  Administered 2024-06-22: 30 mg via INTRAVENOUS

## 2024-06-22 MED ORDER — ONDANSETRON HCL 4 MG PO TABS: 4.0000 mg | ORAL_TABLET | Freq: Four times a day (QID) | ORAL | Status: DC | PRN

## 2024-06-22 MED ORDER — MENTHOL 3 MG MT LOZG: 1.0000 | LOZENGE | OROMUCOSAL | Status: DC | PRN

## 2024-06-22 MED ORDER — HYDROMORPHONE HCL 1 MG/ML IJ SOLN
0.2500 mg | INTRAMUSCULAR | Status: DC | PRN
  Administered 2024-06-22 (×2): 0.5 mg via INTRAVENOUS

## 2024-06-22 MED ORDER — LACTATED RINGERS IV SOLN: INTRAVENOUS | Status: DC

## 2024-06-22 MED ORDER — ORAL CARE MOUTH RINSE: 15.0000 mL | Freq: Once | OROMUCOSAL | Status: AC

## 2024-06-22 MED ORDER — PANTOPRAZOLE SODIUM 40 MG IV SOLR: 40.0000 mg | Freq: Every day | INTRAVENOUS | Status: DC

## 2024-06-22 MED ORDER — ONDANSETRON HCL 4 MG/2ML IJ SOLN
INTRAMUSCULAR | Status: AC
  Filled 2024-06-22: qty 2

## 2024-06-22 MED ORDER — SODIUM CHLORIDE 0.9 % IR SOLN
Status: DC | PRN
  Administered 2024-06-22: 1000 mL

## 2024-06-22 MED ORDER — ACETAMINOPHEN 500 MG PO TABS
ORAL_TABLET | ORAL | Status: AC
  Filled 2024-06-22: qty 2

## 2024-06-22 MED ORDER — CHLORHEXIDINE GLUCONATE 0.12 % MT SOLN
15.0000 mL | Freq: Once | OROMUCOSAL | Status: AC
  Administered 2024-06-22: 15 mL via OROMUCOSAL

## 2024-06-22 MED ORDER — PROPOFOL 10 MG/ML IV BOLUS
INTRAVENOUS | Status: DC | PRN
  Administered 2024-06-22: 160 mg via INTRAVENOUS

## 2024-06-22 MED ORDER — SODIUM CHLORIDE 0.9 % IV SOLN
2.0000 g | INTRAVENOUS | Status: AC
  Administered 2024-06-22: 2 g via INTRAVENOUS

## 2024-06-22 MED ORDER — OXYCODONE-ACETAMINOPHEN 5-325 MG PO TABS
1.0000 | ORAL_TABLET | ORAL | Status: DC | PRN
  Administered 2024-06-22: 1 via ORAL
  Filled 2024-06-22: qty 1

## 2024-06-22 MED ORDER — FENTANYL CITRATE (PF) 100 MCG/2ML IJ SOLN
INTRAMUSCULAR | Status: AC
  Filled 2024-06-22: qty 2

## 2024-06-22 MED ORDER — POVIDONE-IODINE 10 % EX SWAB: 2.0000 | Freq: Once | CUTANEOUS | Status: DC

## 2024-06-22 MED ORDER — SIMETHICONE 80 MG PO CHEW: 80.0000 mg | CHEWABLE_TABLET | Freq: Four times a day (QID) | ORAL | Status: DC | PRN

## 2024-06-22 MED ORDER — LIDOCAINE 2% (20 MG/ML) 5 ML SYRINGE
INTRAMUSCULAR | Status: DC | PRN
  Administered 2024-06-22: 80 mg via INTRAVENOUS

## 2024-06-22 MED ORDER — MIDAZOLAM HCL 2 MG/2ML IJ SOLN
INTRAMUSCULAR | Status: AC
  Filled 2024-06-22: qty 2

## 2024-06-22 MED ORDER — GABAPENTIN 100 MG PO CAPS
ORAL_CAPSULE | ORAL | Status: AC
  Filled 2024-06-22: qty 1

## 2024-06-22 MED ORDER — IBUPROFEN 600 MG PO TABS
600.0000 mg | ORAL_TABLET | Freq: Four times a day (QID) | ORAL | 0 refills | Status: AC | PRN
  Filled 2024-06-22: qty 30, 8d supply, fill #0

## 2024-06-22 MED ORDER — IBUPROFEN 600 MG PO TABS: 600.0000 mg | ORAL_TABLET | Freq: Four times a day (QID) | ORAL | Status: DC

## 2024-06-22 MED ORDER — OXYCODONE HCL 5 MG PO TABS: 5.0000 mg | ORAL_TABLET | Freq: Once | ORAL | Status: DC | PRN

## 2024-06-22 MED ORDER — SUGAMMADEX SODIUM 200 MG/2ML IV SOLN
INTRAVENOUS | Status: DC | PRN
  Administered 2024-06-22: 200 mg via INTRAVENOUS

## 2024-06-22 MED ORDER — MEPERIDINE HCL 25 MG/ML IJ SOLN: 6.2500 mg | INTRAMUSCULAR | Status: DC | PRN

## 2024-06-22 MED ORDER — SODIUM CHLORIDE 0.9 % IV SOLN
INTRAVENOUS | Status: AC
  Filled 2024-06-22: qty 2

## 2024-06-22 MED ORDER — CHLORHEXIDINE GLUCONATE 0.12 % MT SOLN
OROMUCOSAL | Status: AC
  Filled 2024-06-22: qty 15

## 2024-06-22 MED ORDER — DEXTROSE-SODIUM CHLORIDE 5-0.45 % IV SOLN: INTRAVENOUS | Status: DC

## 2024-06-22 MED ORDER — 0.9 % SODIUM CHLORIDE (POUR BTL) OPTIME
TOPICAL | Status: DC | PRN
  Administered 2024-06-22: 1000 mL

## 2024-06-22 MED ORDER — KETOROLAC TROMETHAMINE 30 MG/ML IJ SOLN
30.0000 mg | Freq: Four times a day (QID) | INTRAMUSCULAR | Status: DC
  Administered 2024-06-22: 30 mg via INTRAVENOUS
  Filled 2024-06-22: qty 1

## 2024-06-22 MED ORDER — DEXAMETHASONE SOD PHOSPHATE PF 10 MG/ML IJ SOLN
INTRAMUSCULAR | Status: DC | PRN
  Administered 2024-06-22: 10 mg via INTRAVENOUS

## 2024-06-22 MED ORDER — FENTANYL CITRATE (PF) 250 MCG/5ML IJ SOLN
INTRAMUSCULAR | Status: DC | PRN
  Administered 2024-06-22: 100 ug via INTRAVENOUS

## 2024-06-22 MED ORDER — MIDAZOLAM HCL (PF) 2 MG/2ML IJ SOLN
INTRAMUSCULAR | Status: DC | PRN
  Administered 2024-06-22: 2 mg via INTRAVENOUS

## 2024-06-22 MED ORDER — KETAMINE HCL 10 MG/ML IJ SOLN
INTRAMUSCULAR | Status: DC | PRN
  Administered 2024-06-22 (×2): 20 mg via INTRAVENOUS

## 2024-06-22 MED ORDER — MORPHINE SULFATE (PF) 2 MG/ML IV SOLN: 1.0000 mg | INTRAVENOUS | Status: DC | PRN

## 2024-06-22 MED ORDER — DEXMEDETOMIDINE HCL IN NACL 80 MCG/20ML IV SOLN
INTRAVENOUS | Status: DC | PRN
  Administered 2024-06-22 (×5): 4 ug via INTRAVENOUS

## 2024-06-22 MED ORDER — HYDROMORPHONE HCL 1 MG/ML IJ SOLN
INTRAMUSCULAR | Status: AC
  Filled 2024-06-22: qty 1

## 2024-06-22 MED ORDER — GABAPENTIN 100 MG PO CAPS
100.0000 mg | ORAL_CAPSULE | ORAL | Status: AC
  Administered 2024-06-22: 100 mg via ORAL

## 2024-06-22 SURGICAL SUPPLY — 44 items
COVER MAYO STAND STRL (DRAPES) ×2 IMPLANT
COVER SURGICAL LIGHT HANDLE (MISCELLANEOUS) ×2 IMPLANT
DERMABOND ADVANCED .7 DNX12 (GAUZE/BANDAGES/DRESSINGS) ×2 IMPLANT
DRAPE SURG IRRIG POUCH 19X23 (DRAPES) ×2 IMPLANT
DURAPREP 26ML APPLICATOR (WOUND CARE) ×2 IMPLANT
GLOVE BIO SURGEON STRL SZ 6.5 (GLOVE) ×2 IMPLANT
GLOVE BIOGEL PI IND STRL 6.5 (GLOVE) ×2 IMPLANT
GLOVE BIOGEL PI MICRO STRL 6.5 (GLOVE) ×4 IMPLANT
GLOVE SURG UNDER POLY LF SZ7 (GLOVE) ×8 IMPLANT
GOWN STRL REUS W/ TWL LRG LVL3 (GOWN DISPOSABLE) ×8 IMPLANT
GOWN STRL REUS W/ TWL XL LVL3 (GOWN DISPOSABLE) ×2 IMPLANT
HIBICLENS CHG 4% 4OZ BTL (MISCELLANEOUS) ×2 IMPLANT
IRRIGATION SUCT STRKRFLW 2 WTP (MISCELLANEOUS) ×2 IMPLANT
KIT PINK PAD W/HEAD ARM REST (MISCELLANEOUS) ×2 IMPLANT
KIT TURNOVER KIT B (KITS) ×2 IMPLANT
LIGASURE VESSEL 5MM BLUNT TIP (ELECTROSURGICAL) ×2 IMPLANT
NEEDLE INSUFFLATION 14GA 120MM (NEEDLE) ×2 IMPLANT
OCCLUDER COLPOPNEUMO (BALLOONS) ×2 IMPLANT
PACK LAPAROSCOPY BASIN (CUSTOM PROCEDURE TRAY) ×2 IMPLANT
POUCH LAPAROSCOPIC INSTRUMENT (MISCELLANEOUS) ×2 IMPLANT
POWDER SURGICEL 3.0 GRAM (HEMOSTASIS) IMPLANT
SCISSORS LAP 5X35 DISP (ENDOMECHANICALS) ×2 IMPLANT
SET CYSTO IRRIGATION (SET/KITS/TRAYS/PACK) ×2 IMPLANT
SET TRI-LUMEN FLTR TB AIRSEAL (TUBING) ×2 IMPLANT
SET TUBE SMOKE EVAC HIGH FLOW (TUBING) ×2 IMPLANT
SHEARS HARMONIC 36 ACE (MISCELLANEOUS) ×2 IMPLANT
SLEEVE SCD COMPRESS KNEE MED (STOCKING) ×2 IMPLANT
SOLN 0.9% NACL POUR BTL 1000ML (IV SOLUTION) ×2 IMPLANT
SUT VIC AB 0 CT1 27XBRD ANBCTR (SUTURE) ×4 IMPLANT
SUT VIC AB 4-0 PS2 18 (SUTURE) ×4 IMPLANT
SUT VLOC 180 0 9IN GS21 (SUTURE) ×2 IMPLANT
SYR 10ML LL (SYRINGE) ×2 IMPLANT
SYR 50ML LL SCALE MARK (SYRINGE) ×4 IMPLANT
TIP ENDOSCOPIC SURGICEL (TIP) IMPLANT
TIP UTERINE 6.7X10CM GRN DISP (MISCELLANEOUS) IMPLANT
TIP UTERINE 6.7X8CM BLUE DISP (MISCELLANEOUS) IMPLANT
TOWEL GREEN STERILE FF (TOWEL DISPOSABLE) ×4 IMPLANT
TRAY FOLEY W/BAG SLVR 14FR (SET/KITS/TRAYS/PACK) ×2 IMPLANT
TROCAR ADV FIXATION 5X100MM (TROCAR) ×2 IMPLANT
TROCAR KII 8X100ML NONTHREADED (TROCAR) ×2 IMPLANT
TROCAR PORT AIRSEAL 5X120 (TROCAR) ×2 IMPLANT
TROCAR XCEL NON-BLD 5MMX100MML (ENDOMECHANICALS) ×2 IMPLANT
UNDERPAD 30X36 HEAVY ABSORB (UNDERPADS AND DIAPERS) ×2 IMPLANT
WARMER LAPAROSCOPE (MISCELLANEOUS) ×2 IMPLANT

## 2024-06-22 NOTE — H&P (Signed)
 Diane [name] is an 38 y.o. female G2P2 MWF with hx of menorrhagia and iron  deficiency anemia likely due to degenerating fibroids who is here for definitive treatment with TLH/bilateral salpingectomy, possible oophorectomy and cystoscopy.  She has undergone evaluation with ultrasound showing 6.6 x 4.4cm fibroid complex that approaches the endometrium c/w Type 3 fibroids.  These fibroids did have an atypical appearance so endometrial biopsy was obtained which was negative for abnormal cells as well as a pelvic MRI which showed the same fibroid complex meausuring 6.1 x 5.6 x 5 5cm and showed some fibroid degeneration.  Pt has required multiple iron  infusions with hb of 7.0 in September of this year.  Given her bleeding, anemia, iron  infusions and atypical fibroid appearance on ultrasound, she is ready for definitive treatment with hysterectomy.  Alternatives including UAE or myomectomy have been discussed.  She has been counseled about risks as well and this is documented in prior office notes.   Pertinent Gynecological History: Menses: heavy Bleeding: menorrhagia Contraception: OCPS DES exposure: denies Blood transfusions: none Sexually transmitted diseases: no past history Previous GYN Procedures: NSVD x 2  Last mammogram: n/a due to age Last pap: normal Date: 07/05/2021 OB History: G2, P2   Past Medical History:  Diagnosis Date   Anemia    Anxiety    Asthma    activity induced asthma   BRCA negative 10/2022   MyRisk neg except AXIN2 VUS/FH carrier   Depression    Dyspnea    Endometriosis    Family history of breast cancer 10/2022   riskscore=17.8%/IBIS=18.8%   Fibroid    Gall stones 07/30/2017   GERD (gastroesophageal reflux disease)    Gestational diabetes    Gestational diabetes mellitus    Heart burn 11/26/2021   Hypothyroidism    Mass of right hand 02/20/2018   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 02/2022  Zio: predominantly sinus rhythm @ 85 (53-162). 1 symptomatic run of SVT @ 162 x 16 beats. Rare PACs.   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    Past Surgical History:  Procedure Laterality Date   CHOLECYSTECTOMY N/A 11/28/2017   Procedure: LAPAROSCOPIC CHOLECYSTECTOMY WITH INTRAOPERATIVE CHOLANGIOGRAM;  Surgeon: Gladis Cough, MD;  Location: [facility] ORS;  Service: General;  Laterality: N/A;   LAPAROSCOPY  2009    Family History  Problem Relation Age of Onset   Diabetes Father    Asthma Father    Hypertension Father    Heart disease Father    Other Brother    Cancer Maternal Grandmother    Heart disease Maternal Grandmother    Colon cancer Maternal Grandmother    Breast cancer Maternal Grandmother    Cancer Paternal Grandmother    Pancreatic cancer Paternal Grandmother    Heart disease Paternal Grandfather    Heart disease Maternal Grandfather    Heart disease Maternal Uncle    Thyroid  disease Neg Hx     Social History:  reports that she has never smoked. She has never used smokeless tobacco. She reports current alcohol use. She reports that she does not use drugs.  Allergies: Allergies[1]  Medications Prior to Admission  Medication Sig Dispense Refill Last Dose/Taking   Accu-Chek Softclix Lancets lancets 1 each by Other route 4 (four) times daily. 100 each 12 06/22/2024 Morning   ferrous  sulfate 325 (65 FE) MG EC tablet Take 325 mg by mouth 3 (three) times daily with meals.   06/20/2024   glucose blood (ACCU-CHEK SMARTVIEW) test strip Use as instructed to check blood sugars 100 each 12 06/22/2024 Morning   norethindrone  (AYGESTIN ) 5 MG tablet Take 1 tablet (5 mg total) by mouth in the morning and at bedtime. 60 tablet 1 06/22/2024 at  4:30 AM   sertraline (ZOLOFT) 50 MG tablet Take 50 mg by mouth every morning.   06/22/2024 at  4:30 AM   albuterol  (PROAIR  HFA) 108 (90  Base) MCG/ACT inhaler Inhale 2 puffs into the lungs every 6 (six) hours as needed for wheezing or shortness of breath. 1 each 0 Unknown    Review of Systems  Constitutional: Negative.   Respiratory: Negative.    Cardiovascular: Negative.   Genitourinary:  Positive for menstrual problem.  Psychiatric/Behavioral: Negative.      Blood pressure 114/74, pulse 82, temperature 98 F (36.7 C), temperature source Oral, resp. rate 16, height 5' 5 (1.651 m), weight 85.3 kg, SpO2 98%. Physical Exam Constitutional:      Appearance: Normal appearance.  Cardiovascular:     Rate and Rhythm: Normal rate and regular rhythm.  Pulmonary:     Effort: Pulmonary effort is normal.     Breath sounds: Normal breath sounds.  Abdominal:     General: Abdomen is flat. Bowel sounds are normal. There is no distension.     Palpations: There is no mass.     Tenderness: There is no abdominal tenderness. There is no guarding.  Neurological:     General: No focal deficit present.  Psychiatric:        Mood and Affect: Mood normal.        Behavior: Behavior normal.     Results for orders placed or performed during the hospital encounter of 06/22/24 (from the past 24 hours)  Pregnancy, urine POC     Status: None   Collection Time: 06/22/24  5:52 AM  Result Value Ref Range   Preg Test, Ur NEGATIVE NEGATIVE    No results found.  Assessment/Plan: 38 yo G2P2 MWF with menorrhagia and iron  deficiency anemia requiring multiple iron  infusions due to 6cm fibroid complex with likely degenerating fibroids here for definitve treatment with TLH/bilateral salpingectomy/possible oophorectomy, cystoscopy.  Questions answered.  Pt ready to proceed.  Ronal GORMAN Pinal 06/22/2024, 6:30 AM     [1] No Known Allergies

## 2024-06-22 NOTE — Discharge Instructions (Addendum)

## 2024-06-22 NOTE — Progress Notes (Signed)
*   Day of Surgery * Procedures (LRB): HYSTERECTOMY, TOTAL, LAPAROSCOPIC, WITH BILATERAL SALPINGECTOMY (N/A) CYSTOSCOPY (N/A)  Subjective: Patient reports no nausea.  Voiding well.  Cramping improved with percocet.  No nausea.  Has eaten and walked as well.  Would like to go home.  Post op hb was 11.7.  Objective: I have reviewed patient's vital signs, intake and output, medications, and labs.    06/22/2024    4:02 PM 06/22/2024   12:40 PM 06/22/2024   11:34 AM  Vitals with BMI  Systolic 131 120 893  Diastolic 74 74 68  Pulse 75 65 68    General: alert and no distress Resp: clear to auscultation bilaterally Cardio: regular rate and rhythm, S1, S2 normal, no murmur, click, rub or gallop GI: soft, non-tender; bowel sounds normal; no masses,  no organomegaly Extremities: extremities normal, atraumatic, no cyanosis or edema Vaginal Bleeding: minimal  Assessment: s/p Procedures: HYSTERECTOMY, TOTAL, LAPAROSCOPIC, WITH BILATERAL SALPINGECTOMY (N/A) CYSTOSCOPY (N/A): progressing well  Plan: Discharge home  LOS: 0 days    Ronal GORMAN Pinal, MD 06/22/2024, 6:13 PM

## 2024-06-22 NOTE — Anesthesia Procedure Notes (Signed)
 Procedure Name: Intubation Date/Time: 06/22/2024 7:37 AM  Performed by: Erlene, Katalaya Beel  K, CRNAPre-anesthesia Checklist: Patient identified, Emergency Drugs available, Suction available and Patient being monitored Patient Re-evaluated:Patient Re-evaluated prior to induction Oxygen Delivery Method: Circle system utilized Preoxygenation: Pre-oxygenation with 100% oxygen Induction Type: IV induction Ventilation: Mask ventilation without difficulty Laryngoscope Size: Miller and 2 Grade View: Grade I Tube type: Oral Tube size: 7.0 mm Number of attempts: 1 Airway Equipment and Method: Stylet Placement Confirmation: ETT inserted through vocal cords under direct vision, positive ETCO2 and breath sounds checked- equal and bilateral Secured at: 22 cm Tube secured with: Tape Dental Injury: Teeth and Oropharynx as per pre-operative assessment

## 2024-06-22 NOTE — Anesthesia Postprocedure Evaluation (Signed)
"   Anesthesia Post Note  Patient: Diane [name]  Procedure(s) Performed: HYSTERECTOMY, TOTAL, LAPAROSCOPIC, WITH BILATERAL SALPINGECTOMY (Pelvis) CYSTOSCOPY (Bladder)     Patient location during evaluation: PACU Anesthesia Type: General Level of consciousness: awake and alert Pain management: pain level controlled Vital Signs Assessment: post-procedure vital signs reviewed and stable Respiratory status: spontaneous breathing, nonlabored ventilation and respiratory function stable Cardiovascular status: blood pressure returned to baseline and stable Postop Assessment: no apparent nausea or vomiting Anesthetic complications: no   No notable events documented.  Last Vitals:  Vitals:   06/22/24 1032 06/22/24 1100  BP: 98/73 104/69  Pulse: 68 66  Resp: 19 16  Temp: 36.8 C   SpO2: 94% 96%    Last Pain:  Vitals:   06/22/24 1030  TempSrc:   PainSc: 4                  [employer]      "

## 2024-06-22 NOTE — Op Note (Signed)
 06/22/2024  10:13 AM  PATIENT:  Diane [name]  38 y.o. female  PRE-OPERATIVE DIAGNOSIS:  menorrhagia  submucosal fibroid  POST-OPERATIVE DIAGNOSIS:  menorrhagia submucosal fibroid  PROCEDURE:  Procedures: HYSTERECTOMY, TOTAL, LAPAROSCOPIC, WITH BILATERAL SALPINGECTOMY CYSTOSCOPY  SURGEON:  Ronal GORMAN Pinal  ASSISTANTS: Buzz Carolin, MD.  An experienced assistant was required given the standard of surgical care given the complexity of the case.  This assistant was needed for exposure, dissection, suctioning, retraction, instrument exchange and for overall help during the procedure.  RNFA help was also unavailable.  ANESTHESIA:   general  ESTIMATED BLOOD LOSS: 100 mL  BLOOD ADMINISTERED:none   FLUIDS: 1000ccLR  UOP: 200cc clear UOP  SPECIMEN:  uterus, cervix, bilateral fallopian tubes.    DISPOSITION OF SPECIMEN:  PATHOLOGY  FINDINGS: enlarged, bulky uterus, normal ovaries, normal upper abdomen, normal appendix  DESCRIPTION OF OPERATION: Patient is taken to the operating room. She is placed in the supine position. She is a running IV in place. Informed consent was present on the chart. SCDs on her lower extremities and functioning properly. Patient was positioned while she was awake.  Her legs were placed in the low lithotomy position in [place] stirrups. Her arms were tucked by the side.  General endotracheal anesthesia was administered by the anesthesia staff without difficulty. Dr. Pinal, anesthesia, oversaw case.  Time out performed.    Clora prep was then used to prep the abdomen and Hibiclens  was used to prep the inner thighs, perineum and vagina. Once 3 minutes had past the patient was draped in a normal standard fashion. The legs were lifted to the high lithotomy position. The cervix was visualized by placing a heavy weighted speculum in the posterior aspect of the vagina and using a curved Deaver retractor to the retract anteriorly. The anterior lip of the cervix was  grasped with single-tooth tenaculum.  The cervix sounded to 8 cm. Pratt dilators were used to dilate the cervix up to a #21. A RUMI uterine manipulator was obtained. A #8 disposable tip was placed on the RUMI manipulator as well as a 2.5 silver KOH ring. This was passed through the cervix and the bulb of the disposable tip was inflated with 10 cc of normal saline. There was a good fit of the KOH ring around the cervix. The tenaculum was removed. There is also good manipulation of the uterus. The speculum and retractor were removed as well. A Foley catheter was placed to straight drain.  Clear urine was noted. Legs were lowered to the low lithotomy position and attention was turned the abdomen.  The umbilicus was everted.  Marcaine  0.25% used to anesthetize the skin.  Using #11 blade, 5mm skin incision was made.  A Veress needle was obtained. Syringe of sterile saline was placed on a open Veress needle.  With the abdomen elevated, the Veress needle was passed into the umbilicus until the pop was heard and then fluid started to drip.  Then low flow CO2 gas was attached the needle and the pneumoperitoneum was achieved without difficulty. Once four liters of gas was in the abdomen the Veress needle was removed and a 5 millimeter non-bladed Optiview trocar and port were passed directly to the abdomen. The laparoscope was then used to confirm intraperitoneal placement. Findings included enlarged bulky uterus.  Locations for RLQ, LLQ, and suprapubic ports were noted by transillumination of the abdominal wall.  0.25% marcaine  was used to anesthetize the skin.  8mm skin incision was made in the RLQ.  Then a 5mm skin incision was made and a 5mm nonbladed trochar and port was placed in the LLQ.  Finally, and 8mm skin incision was made about 4cm above the pubic symphasis and an 8mm non-bladed port was placed with direct visualization of the laparoscope.  All trochars were removed.    Ureters were identifies.  Attention was  turned to the left side. With uterus on stretch the left tube was excised off the ovary and mesosalpinx was dissected to free the tube. Then the left utero-ovarian pedicle was serially clamped cauterized and incised using the ligasure device. Left round ligament was serially clamped cauterized and incised. The anterior and posterior peritoneum of the inferior leaf of the broad ligament were opened. The beginning of the bladder flap was created.  The bladder was taken down below the level of the KOH ring. The left uterine artery skeletonized and then just superior to the KOH ring this vessel was serially clamped and cauterized to decrease back bleeding.    Attention was turned the right side.  The uterus was placed on stretch to the opposite side.  The tube was excised off the ovary using sharp dissection a bipolar cautery.  The mesosalpinx was incised freeing the tube. Then the right uterine ovarian pedicle was serially clamped cauterized and incised. Next the right round ligament was serially clamped cauterized and incised. The anterior posterior peritoneum of the inferiorly for the broad ligament were opened. The anterior peritoneum was carried across to the dissection on the left side. The remainder of the bladder flap was created using sharp dissection. The bladder was well below the level of the KOH ring. The right uterine artery skeletonized. Then the right uterine artery, above the level of the KOH ring, was serially clamped and cauterized.  Attention was turned back to the left uterine artery.  It was cauterized again and then cut at the superior edge of the KOH ring.  This was repeated in a similar fashion on the right size.  The uterus was devascularized at this point.  The colpotomy was performed a starting in the midline and using a harmonic scalpel with the inferior edge of the open blade  This was carried around a circumferential fashion until the vaginal mucosa was completely incised in the  specimen was freed.  The specimen was then delivered to the vagina.  A vaginal occlusive device was used to maintain the pneumoperitoneum  Instruments were changed with a needle driver and Kobra graspers.  Using a 9 inch V. lock suture, the cuff was closed by incorporating the anterior and posterior vaginal mucosa in each stitch. This was carried across all the way to the left corner and a running fashion. Two stitches were brought back towards the midline and the suture was cut flush with the vagina. The needle was brought out the pelvis. The pelvis was irrigated. All pedicles were inspected. No bleeding was noted.   Co2 pressures were lowered to 8mm Hg.  Again, no bleeding was noted.  Ureters were noted deep in the pelvis to be peristalsing.  Arista was placed along the pedicles.  At this point the procedure was completed.  The remaining instruments were removed.  The ports (except the suprapubic port) were removed under direct visualization of the laparoscope and the pneumoperitoneum was relieved.  The patient was taken out of Trendelenburg positioning.  Several deep breaths were given to the patient's trying to any gas the abdomen and finally the suprapubic port was removed.  The  skin was then closed with subcuticular stitches of 3-0 Vicryl. The skin was cleansed Dermabond was applied. Attention was then turned the vagina and the cuff was inspected. No bleeding was noted. The anterior posterior vaginal mucosa was incorporated in each stitch. The Foley catheter was removed.  Cystoscopy was performed.  No sutures or bladder injuries were noted.  Ureters were noted with normal urine jets from each one was seen.  Foley was left out after the cystoscopic fluid was drained and cystoscope removed.  Sponge, lap, needle, instrument counts were correct x2. Patient tolerated the procedure very well. She was awakened from anesthesia, extubated and taken to recovery in stable condition.   COUNTS:  YES  PLAN OF CARE:  Transfer to PACU

## 2024-06-22 NOTE — Transfer of Care (Signed)
 Immediate Anesthesia Transfer of Care Note  Patient: Diane [name]  Procedure(s) Performed: HYSTERECTOMY, TOTAL, LAPAROSCOPIC, WITH BILATERAL SALPINGECTOMY (Pelvis) CYSTOSCOPY (Bladder)  Patient Location: PACU  Anesthesia Type:General  Level of Consciousness: drowsy and patient cooperative  Airway & Oxygen Therapy: Patient Spontanous Breathing and Patient connected to face mask oxygen  Post-op Assessment: Report given to RN and Post -op Vital signs reviewed and stable  Post vital signs: Reviewed and stable  Last Vitals:  Vitals Value Taken Time  BP 107/70 06/22/24 10:00  Temp    Pulse 76 06/22/24 10:02  Resp 18 06/22/24 10:02  SpO2 99 % 06/22/24 10:02  Vitals shown include unfiled device data.  Last Pain:  Vitals:   06/22/24 0606  TempSrc: Oral  PainSc: 0-No pain      Patients Stated Pain Goal: 4 (06/22/24 0606)  Complications: No notable events documented.

## 2024-06-22 NOTE — Anesthesia Preprocedure Evaluation (Addendum)
"                                    Anesthesia Evaluation  Patient identified by MRN, date of birth, ID band Patient awake    Reviewed: Allergy & Precautions, NPO status , Patient's Chart, lab work & pertinent test results  History of Anesthesia Complications Negative for: history of anesthetic complications  Airway Mallampati: II  TM Distance: >3 FB Neck ROM: Full    Dental  (+) Dental Advisory Given   Pulmonary asthma (not used inhalers in years) , neg sleep apnea, neg COPD, Not current smoker   Pulmonary exam normal breath sounds clear to auscultation       Cardiovascular (-) hypertension(-) Past MI and (-) CHF Normal cardiovascular exam(-) dysrhythmias (-) Valvular Problems/Murmurs Rhythm:Regular Rate:Normal     Neuro/Psych neg Seizures  Anxiety Depression       GI/Hepatic Neg liver ROS,GERD  Medicated and Poorly Controlled,,  Endo/Other  diabetesHypothyroidism    Renal/GU negative Renal ROS     Musculoskeletal   Abdominal  (+) + obese  Peds  Hematology  (+) Blood dyscrasia, anemia   Anesthesia Other Findings   Reproductive/Obstetrics                              Anesthesia Physical Anesthesia Plan  ASA: 3  Anesthesia Plan: General   Post-op Pain Management: Tylenol  PO (pre-op)* and Gabapentin  PO (pre-op)*   Induction: Intravenous  PONV Risk Score and Plan: 3 and Dexamethasone , Ondansetron , Midazolam  and Treatment may vary due to age or medical condition  Airway Management Planned: Oral ETT  Additional Equipment:   Intra-op Plan:   Post-operative Plan: Extubation in OR  Informed Consent: I have reviewed the patients History and Physical, chart, labs and discussed the procedure including the risks, benefits and alternatives for the proposed anesthesia with the patient or authorized representative who has indicated his/her understanding and acceptance.       Plan Discussed with:   Anesthesia Plan  Comments:          Anesthesia Quick Evaluation  "

## 2024-06-22 NOTE — Progress Notes (Signed)
 Discharge instructions and prescriptions given to pt. Discussed post lap hysterectomy care, signs and symptoms to report to the MD upcoming appointments, and meds. Pt verbalizes understanding and has no questions or concerns at this time. Pt discharged home from hospital in stable condition.

## 2024-06-23 ENCOUNTER — Encounter (HOSPITAL_COMMUNITY): Payer: Self-pay | Admitting: Obstetrics & Gynecology

## 2024-06-28 ENCOUNTER — Encounter (HOSPITAL_BASED_OUTPATIENT_CLINIC_OR_DEPARTMENT_OTHER): Payer: Self-pay | Admitting: Obstetrics & Gynecology

## 2024-06-28 NOTE — Progress Notes (Signed)
 GYNECOLOGY  VISIT  CC:   post op recheck  HPI: 39 y.o. G2P2002 Married White or Caucasian female here for recheck after undergoing TLH with Bilateral Salpingectomy and Cystoscopy on 06/22/2024.  She reports bleeding is none.  She has no pain.  Bowel function is Normal.  Bladder function is normal.  Patient reports no issues or concerns today.  Pathology reviewed:  Yes .  Questions answered.    MEDS:  Medications Ordered Prior to Encounter[1]  SH:  Smoking No    PHYSICAL EXAMINATION:    BP 108/66 (BP Location: Right Arm, Patient Position: Sitting, Cuff Size: Normal)   Pulse 81   Wt 188 lb (85.3 kg)   LMP  (LMP Unknown) Comment: Hysterectomy  SpO2 98%   BMI 31.28 kg/m     General appearance: alert, cooperative and appears stated age CV:  Regular rate and rhythm Lungs:  clear to auscultation, no wheezes, rales or rhonchi, symmetric air entry Abdomen: soft, non-tender; bowel sounds normal; no masses,  no organomegaly Incisions:  C/D/I  Pelvic: deferred   Assessment/Plan: 1. Post-operative state (Primary) - limitations discussed - follow up scheduled in about 4 weeks  2. Vaginal itching - fluconazole  (DIFLUCAN ) 150 MG tablet; Take 1 tablet (150 mg total) by mouth once for 1 dose.  Dispense: 1 tablet; Refill: 0        [1]  Current Outpatient Medications on File Prior to Visit  Medication Sig Dispense Refill   Accu-Chek Softclix Lancets lancets 1 each by Other route 4 (four) times daily. 100 each 12   albuterol  (PROAIR  HFA) 108 (90 Base) MCG/ACT inhaler Inhale 2 puffs into the lungs every 6 (six) hours as needed for wheezing or shortness of breath. 1 each 0   ferrous sulfate  325 (65 FE) MG EC tablet Take 1 tablet (325 mg total) by mouth daily with breakfast.     glucose blood (ACCU-CHEK SMARTVIEW) test strip Use as instructed to check blood sugars 100 each 12   ibuprofen  (ADVIL ) 600 MG tablet Take 1 tablet (600 mg total) by mouth every 6 (six) hours as needed. 30 tablet 0    oxyCODONE -acetaminophen  (PERCOCET/ROXICET) 5-325 MG tablet Take 1-2 tablets by mouth every 6 (six) hours as needed for moderate pain (pain score 4-6) or severe pain (pain score 7-10). (Patient not taking: Reported on 06/29/2024) 30 tablet 0   sertraline (ZOLOFT) 50 MG tablet Take 50 mg by mouth every morning.     No current facility-administered medications on file prior to visit.

## 2024-06-29 ENCOUNTER — Encounter (HOSPITAL_BASED_OUTPATIENT_CLINIC_OR_DEPARTMENT_OTHER): Payer: Self-pay | Admitting: Obstetrics & Gynecology

## 2024-06-29 ENCOUNTER — Ambulatory Visit (INDEPENDENT_AMBULATORY_CARE_PROVIDER_SITE_OTHER): Admitting: Obstetrics & Gynecology

## 2024-06-29 VITALS — BP 108/66 | HR 81 | Wt 188.0 lb

## 2024-06-29 DIAGNOSIS — Z48816 Encounter for surgical aftercare following surgery on the genitourinary system: Secondary | ICD-10-CM

## 2024-06-29 DIAGNOSIS — Z9889 Other specified postprocedural states: Secondary | ICD-10-CM

## 2024-06-29 DIAGNOSIS — L2989 Other pruritus: Secondary | ICD-10-CM

## 2024-06-29 DIAGNOSIS — N898 Other specified noninflammatory disorders of vagina: Secondary | ICD-10-CM

## 2024-06-29 MED ORDER — FLUCONAZOLE 150 MG PO TABS: 150.0000 mg | ORAL_TABLET | Freq: Once | ORAL | 0 refills | Status: AC

## 2024-07-05 ENCOUNTER — Ambulatory Visit: Admitting: Clinical

## 2024-07-05 DIAGNOSIS — F411 Generalized anxiety disorder: Secondary | ICD-10-CM

## 2024-07-05 DIAGNOSIS — F4321 Adjustment disorder with depressed mood: Secondary | ICD-10-CM

## 2024-07-05 NOTE — Progress Notes (Signed)
 "  [facility] Counselor/Therapist Progress Note  Patient ID: Diane [name], MRN: [id number],    Date: 07/05/2024  Time Spent: 9:36am - 10:15am : 39 minutes   Treatment Type: Individual Therapy  Reported Symptoms: fatigue  Mental Status Exam: Appearance:  Neat and Well Groomed     Behavior: Appropriate  Motor: Normal  Speech/Language:  Clear and Coherent and Normal Rate  Affect: Appropriate  Mood: normal  Thought process: normal  Thought content:   WNL  Sensory/Perceptual disturbances:   WNL  Orientation: oriented to person, place, time/date, and situation  Attention: Good  Concentration: Good  Memory: WNL  Fund of knowledge:  Good  Insight:   Good  Judgment:  Good  Impulse Control: Good   Risk Assessment: Danger to Self:  No Patient denied current suicidal ideation  Self-injurious Behavior: No Danger to Others: No Patient denied current homicidal ideation Duty to Warn:no Physical Aggression / Violence:No  Access to Firearms a concern: No  Gang Involvement:No   Subjective: Patient stated, I did have surgery and stated, everything's been ok.  Patient reported feeling fatigue and reported fatigue is expected with recovery from surgery. Patient stated, mood's been ok in response to mood since last session. Patient stated, it's been a lot lately and reported multiple events recently. Patient stated, I was really nervous going into it, I was really nervous about the surgery and the whole process. Patient reported patient was able to return home on the day of surgery. Patient stated, I was trying to find the balance in reference to anxiety, catastrophizing thoughts, and discussions regarding advanced planning in preparation for surgery. Patient stated, It was a fair amount of anxiety in reference to recent surgery. Patient stated, I think a lot of it was normal nerves before surgery. Patient reported feeling surgeon validated patient's decision  regarding surgery. Patient stated, because I wasn't listened to doesn't help the anxiety with things in reference to medical experiences precipitating surgery. Patient reported experiencing thought,  is it my gut or my anxiety in reference to medical decisions and medical concerns. Patient stated, it's good in reference to patient's current mood.   Interventions: Cognitive Behavioral Therapy. Clinician conducted session via caregility video from clinician's home office. Patient provided verbal consent to proceed with telehealth session and is aware of limitations of telephone or video visits. Patient participated in session from patient's home. Reviewed events since last session and assessed for changes. Discussed recent surgery, patient's recovery from surgery, and patient's response to surgery. Explored and identified thoughts and emotions triggered by surgery and medical experiences. Reviewed challenging negative thoughts patterns/cognitive distortions.    Collaboration of Care: not required at this time   Diagnosis:  Generalized anxiety disorder   Adjustment disorder with depressed mood    Plan: Patient is to utilize [employer] Therapy, thought re-framing, behavioral activation, mindfulness and coping strategies to decrease symptoms associated with their diagnosis. Frequency: bi-weekly  Modality: individual      Long-term goal:   Reduce overall level, frequency, and intensity of the feelings of anxiety as evidenced by decreased worrying about worse case scenario situations, irritability, overstimulation by noises in the environment, my mind just starts spiraling, nausea when feeling anxious, shaky feeling, feeling on edge, lack of motivation when anxious, difficulty concentrating, fatigue and increased sleep when feeling anxious from 6 to 7 days/week to 0 to 1 days/week per patient report for at least 3 consecutive months. Target Date: 11/03/24  Progress: progressing     Short-term goal:  Develop and implement mindfulness strategies to increase patient being present when spending time with family and other activities Target Date: 11/23/24  Progress: progressing    Increase and maintain daily self care strategies, such as, drinking water, eating 3 healthy meals per day, healthy sleep habits, exercising, and work/life balance Target Date: 11/23/24  Progress: progressing    Develop and implement coping strategies and effective stress management strategies to decrease feelings of irritability and negative responses to stress Target Date: 11/23/24  Progress: progressing    Identify, challenge, and replace negative thought patterns that contribute to feelings of guilt and anxiety with positive thoughts and beliefs per patient's report Target Date: 11/23/24  Progress: progressing    Begin healthy grieving process Target Date: 11/23/24  Progress: progressing     Darice Seats, LCSW    "

## 2024-07-05 NOTE — Progress Notes (Signed)
   Darice Seats, LCSW

## 2024-07-23 LAB — SURGICAL PATHOLOGY

## 2024-07-24 ENCOUNTER — Ambulatory Visit (HOSPITAL_BASED_OUTPATIENT_CLINIC_OR_DEPARTMENT_OTHER): Payer: Self-pay | Admitting: Obstetrics & Gynecology

## 2024-07-26 ENCOUNTER — Encounter (HOSPITAL_BASED_OUTPATIENT_CLINIC_OR_DEPARTMENT_OTHER): Payer: Self-pay | Admitting: Obstetrics & Gynecology

## 2024-07-28 NOTE — Progress Notes (Unsigned)
 GYNECOLOGY  VISIT  CC:   post op recheck  HPI: 39 y.o. G2P2002 Married White or Caucasian female here for recheck after undergoing TLH with Bilateral Salpingectomy and Cystoscopy on 06/22/2024.  She reports bleeding is {None/Minimal:21241}.  She has {No/  **:31982} pain.  Bowel function is {Normal/Abnormal:[id number]}.  Bladder function is normal.    Pathology reviewed:  {YES/NO:21197}.  Questions answered.    MEDS:  Medications Ordered Prior to Encounter[1]  SH:  Smoking {YES/NO:21197}   PHYSICAL EXAMINATION:    LMP  (LMP Unknown) Comment: Hysterectomy    General appearance: alert, cooperative and appears stated age CV:  {Exam; heart brief:31539} Lungs:  {pe lungs ob:314451} Abdomen: soft, non-tender; bowel sounds normal; no masses,  no organomegaly Incisions:  C/D/I  Pelvic: External genitalia:  no lesions              Urethra:  normal appearing urethra with no masses, tenderness or lesions              Bartholins and Skenes: normal                 Vagina: normal appearing vagina with normal color and discharge, no lesions              Cervix: {CHL AMB PHY EX CERVIX NORM DEFAULT:[phone]::no lesions}              Bimanual Exam:  Uterus:  {CHL AMB PHY EX UTERUS NORM DEFAULT:[phone]::normal size, contour, position, consistency, mobility, non-tender}              Adnexa: {CHL AMB PHY EX ADNEXA NO MASS DEFAULT:[phone]::no mass, fullness, tenderness}  Chaperone, ***, was present for exam.  Assessment/Plan:       [1]  Current Outpatient Medications on File Prior to Visit  Medication Sig Dispense Refill   Accu-Chek Softclix Lancets lancets 1 each by Other route 4 (four) times daily. 100 each 12   albuterol  (PROAIR  HFA) 108 (90 Base) MCG/ACT inhaler Inhale 2 puffs into the lungs every 6 (six) hours as needed for wheezing or shortness of breath. 1 each 0   ferrous sulfate  325 (65 FE) MG EC tablet Take 1 tablet (325 mg total) by mouth daily with breakfast.      glucose blood (ACCU-CHEK SMARTVIEW) test strip Use as instructed to check blood sugars 100 each 12   ibuprofen  (ADVIL ) 600 MG tablet Take 1 tablet (600 mg total) by mouth every 6 (six) hours as needed. 30 tablet 0   oxyCODONE -acetaminophen  (PERCOCET/ROXICET) 5-325 MG tablet Take 1-2 tablets by mouth every 6 (six) hours as needed for moderate pain (pain score 4-6) or severe pain (pain score 7-10). (Patient not taking: Reported on 06/29/2024) 30 tablet 0   sertraline (ZOLOFT) 50 MG tablet Take 50 mg by mouth every morning.     No current facility-administered medications on file prior to visit.

## 2024-07-29 ENCOUNTER — Ambulatory Visit (HOSPITAL_BASED_OUTPATIENT_CLINIC_OR_DEPARTMENT_OTHER): Admitting: Obstetrics & Gynecology

## 2024-07-29 ENCOUNTER — Encounter (HOSPITAL_BASED_OUTPATIENT_CLINIC_OR_DEPARTMENT_OTHER): Payer: Self-pay | Admitting: Obstetrics & Gynecology

## 2024-07-29 ENCOUNTER — Encounter: Payer: Self-pay | Admitting: Oncology

## 2024-07-29 VITALS — BP 128/81 | HR 94 | Wt 186.0 lb

## 2024-07-29 DIAGNOSIS — N393 Stress incontinence (female) (male): Secondary | ICD-10-CM

## 2024-07-30 ENCOUNTER — Ambulatory Visit: Admitting: Clinical

## 2024-07-30 DIAGNOSIS — F4321 Adjustment disorder with depressed mood: Secondary | ICD-10-CM

## 2024-07-30 DIAGNOSIS — F411 Generalized anxiety disorder: Secondary | ICD-10-CM

## 2024-07-30 NOTE — Progress Notes (Signed)
   Darice Seats, LCSW

## 2024-07-30 NOTE — Progress Notes (Signed)
 "  [facility] Counselor/Therapist Progress Note  Patient ID: DIETRICH KE, MRN: [id number],    Date: 07/30/2024  Time Spent: 9:34am - 10:26am : 52 minutes   Treatment Type: Individual Therapy  Reported Symptoms: sadness and tearful during session when discussed loss of father  Mental Status Exam: Appearance:  Neat and Well Groomed     Behavior: Appropriate  Motor: Normal  Speech/Language:  Clear and Coherent and Normal Rate  Affect: Tearful when discussing loss of father  Mood: sad when discussing loss of father  Thought process: normal  Thought content:   WNL  Sensory/Perceptual disturbances:   WNL  Orientation: oriented to person, place, time/date, and situation  Attention: Good  Concentration: Good  Memory: WNL  Fund of knowledge:  Good  Insight:   Good  Judgment:  Good  Impulse Control: Good   Risk Assessment: Danger to Self:  No Patient denied current suicidal ideation  Self-injurious Behavior: No Danger to Others: No Patient denied current homicidal ideation Duty to Warn:no Physical Aggression / Violence:No  Access to Firearms a concern: No  Gang Involvement:No   Subjective: Patient stated, everything else has been going ok in response to events since last session. Patient stated, pretty good in response to mood since last session. Patient stated, I haven't had a lot of anxiety and reported no depressive symptoms since last session. Patient reported enjoying having others around during recent winter weather (children, husband, sister in law), feeling better physically, not having doctors appointments/tests, not being in the environment father passed away in are contributing factors to recent decrease in anxiety. Patient reported patient has not taken Zoloft in 2 weeks and reported concern related to medication side effects. Patient stated, I had noticed a difference in the anxiety in reference to medication and stated, I definitely did notice the  difference. Patient stated, I did have a lot of anxiety around the medical things and I did feel like the Zoloft helped me manage that. Patient reported feelings of anger regarding recent medical symptoms and reported feeling patient's decision regarding fertility was taken away from patient. Patient stated, I feel good in response to current mood.   Interventions: Cognitive Behavioral Therapy. Clinician conducted session via caregility video from clinician's home office. Patient provided verbal consent to proceed with telehealth session and is aware of limitations of telephone or video visits. Patient participated in session from patient's home. Reviewed events since last session and assessed for changes. Discussed decrease in symptoms. Explored and identified contributing factors to decrease in anxiety, depressive symptoms, and stability in mood. Discussed patient's concerns related to medication side effects and patient's decision to discontinue medication. Provided psycho education related to psychotropic medications. Discussed following up with psychiatrist in reference to medication. Discussed feelings of anger related to recent medical events.    Collaboration of Care: not required at this time   Diagnosis:  Generalized anxiety disorder   Adjustment disorder with depressed mood    Plan: Patient is to utilize [employer] Therapy, thought re-framing, behavioral activation, mindfulness and coping strategies to decrease symptoms associated with their diagnosis. Frequency: bi-weekly  Modality: individual      Long-term goal:   Reduce overall level, frequency, and intensity of the feelings of anxiety as evidenced by decreased worrying about worse case scenario situations, irritability, overstimulation by noises in the environment, my mind just starts spiraling, nausea when feeling anxious, shaky feeling, feeling on edge, lack of motivation when anxious, difficulty concentrating,  fatigue and increased sleep when  feeling anxious from 6 to 7 days/week to 0 to 1 days/week per patient report for at least 3 consecutive months. Target Date: 11/03/24  Progress: progressing    Short-term goal:  Develop and implement mindfulness strategies to increase patient being present when spending time with family and other activities Target Date: 11/23/24  Progress: progressing    Increase and maintain daily self care strategies, such as, drinking water, eating 3 healthy meals per day, healthy sleep habits, exercising, and work/life balance Target Date: 11/23/24  Progress: progressing    Develop and implement coping strategies and effective stress management strategies to decrease feelings of irritability and negative responses to stress Target Date: 11/23/24  Progress: progressing    Identify, challenge, and replace negative thought patterns that contribute to feelings of guilt and anxiety with positive thoughts and beliefs per patient's report Target Date: 11/23/24  Progress: progressing    Begin healthy grieving process Target Date: 11/23/24  Progress: progressing     Darice Seats, LCSW    "

## 2024-08-31 ENCOUNTER — Inpatient Hospital Stay

## 2024-09-06 ENCOUNTER — Inpatient Hospital Stay

## 2024-09-06 ENCOUNTER — Inpatient Hospital Stay: Admitting: Oncology

## 2024-12-09 ENCOUNTER — Ambulatory Visit: Admitting: Primary Care

## 2025-06-14 ENCOUNTER — Encounter: Admitting: Primary Care
